# Patient Record
Sex: Male | Born: 1955
Health system: Southern US, Community
[De-identification: ages and names within clinical notes are randomized; demographics above are authoritative.]

## PROBLEM LIST (undated history)

## (undated) DIAGNOSIS — B1910 Unspecified viral hepatitis B without hepatic coma: Secondary | ICD-10-CM

## (undated) DIAGNOSIS — D3610 Benign neoplasm of peripheral nerves and autonomic nervous system, unspecified: Secondary | ICD-10-CM

## (undated) DIAGNOSIS — T7840XA Allergy, unspecified, initial encounter: Secondary | ICD-10-CM

## (undated) DIAGNOSIS — E785 Hyperlipidemia, unspecified: Secondary | ICD-10-CM

## (undated) DIAGNOSIS — E039 Hypothyroidism, unspecified: Secondary | ICD-10-CM

## (undated) DIAGNOSIS — K219 Gastro-esophageal reflux disease without esophagitis: Secondary | ICD-10-CM

## (undated) DIAGNOSIS — J984 Other disorders of lung: Secondary | ICD-10-CM

## (undated) DIAGNOSIS — S51819A Laceration without foreign body of unspecified forearm, initial encounter: Secondary | ICD-10-CM

## (undated) HISTORY — DX: Allergy, unspecified, initial encounter: T78.40XA

## (undated) HISTORY — PX: OTHER SURGICAL HISTORY: SHX169

## (undated) HISTORY — DX: Other disorders of lung: J98.4

## (undated) HISTORY — PX: LUNG SURGERY: SHX703

## (undated) HISTORY — PX: LIPOMA EXCISION: SHX5283

## (undated) HISTORY — DX: Gastro-esophageal reflux disease without esophagitis: K21.9

## (undated) HISTORY — DX: Hyperlipidemia, unspecified: E78.5

## (undated) HISTORY — DX: Unspecified viral hepatitis B without hepatic coma: B19.10

## (undated) HISTORY — PX: UPPER GASTROINTESTINAL ENDOSCOPY: SHX188

## (undated) HISTORY — PX: COLONOSCOPY: SHX174

## (undated) MED FILL — Dexamethasone Sodium Phosphate Inj 100 MG/10ML: INTRAMUSCULAR | Qty: 1 | Status: AC

---

## 2001-07-19 ENCOUNTER — Encounter: Admission: RE | Admit: 2001-07-19 | Discharge: 2001-07-19 | Payer: Self-pay | Admitting: Rheumatology

## 2001-07-19 ENCOUNTER — Encounter: Payer: Self-pay | Admitting: Rheumatology

## 2001-07-21 ENCOUNTER — Encounter: Admission: RE | Admit: 2001-07-21 | Discharge: 2001-07-21 | Payer: Self-pay | Admitting: Rheumatology

## 2001-07-21 ENCOUNTER — Encounter: Payer: Self-pay | Admitting: Rheumatology

## 2001-09-29 ENCOUNTER — Encounter: Payer: Self-pay | Admitting: Family Medicine

## 2001-09-29 ENCOUNTER — Encounter: Admission: RE | Admit: 2001-09-29 | Discharge: 2001-09-29 | Payer: Self-pay | Admitting: Family Medicine

## 2007-02-09 ENCOUNTER — Emergency Department (HOSPITAL_COMMUNITY): Admission: EM | Admit: 2007-02-09 | Discharge: 2007-02-09 | Payer: Self-pay | Admitting: Emergency Medicine

## 2007-12-05 ENCOUNTER — Ambulatory Visit: Payer: Self-pay | Admitting: Internal Medicine

## 2007-12-16 ENCOUNTER — Ambulatory Visit: Payer: Self-pay | Admitting: Internal Medicine

## 2007-12-16 ENCOUNTER — Encounter: Payer: Self-pay | Admitting: Internal Medicine

## 2008-07-24 ENCOUNTER — Telehealth: Payer: Self-pay | Admitting: Internal Medicine

## 2008-07-25 ENCOUNTER — Ambulatory Visit: Payer: Self-pay | Admitting: Internal Medicine

## 2008-07-25 DIAGNOSIS — Z860100 Personal history of colon polyps, unspecified: Secondary | ICD-10-CM | POA: Insufficient documentation

## 2008-07-25 DIAGNOSIS — K602 Anal fissure, unspecified: Secondary | ICD-10-CM | POA: Insufficient documentation

## 2008-09-10 ENCOUNTER — Encounter: Payer: Self-pay | Admitting: Internal Medicine

## 2008-10-18 ENCOUNTER — Telehealth: Payer: Self-pay | Admitting: Internal Medicine

## 2008-10-18 ENCOUNTER — Ambulatory Visit: Payer: Self-pay | Admitting: Internal Medicine

## 2008-10-18 DIAGNOSIS — K589 Irritable bowel syndrome without diarrhea: Secondary | ICD-10-CM | POA: Insufficient documentation

## 2008-10-19 ENCOUNTER — Telehealth: Payer: Self-pay | Admitting: Internal Medicine

## 2008-10-24 ENCOUNTER — Encounter: Payer: Self-pay | Admitting: Internal Medicine

## 2008-10-29 ENCOUNTER — Telehealth: Payer: Self-pay | Admitting: Internal Medicine

## 2008-11-02 ENCOUNTER — Telehealth: Payer: Self-pay | Admitting: Internal Medicine

## 2008-11-02 DIAGNOSIS — R142 Eructation: Secondary | ICD-10-CM

## 2008-11-02 DIAGNOSIS — R141 Gas pain: Secondary | ICD-10-CM | POA: Insufficient documentation

## 2008-11-02 DIAGNOSIS — R143 Flatulence: Secondary | ICD-10-CM

## 2008-11-02 DIAGNOSIS — R110 Nausea: Secondary | ICD-10-CM | POA: Insufficient documentation

## 2008-11-02 DIAGNOSIS — R1011 Right upper quadrant pain: Secondary | ICD-10-CM | POA: Insufficient documentation

## 2008-11-05 ENCOUNTER — Ambulatory Visit: Payer: Self-pay | Admitting: Internal Medicine

## 2008-11-05 LAB — CONVERTED CEMR LAB
ALT: 48 units/L (ref 0–53)
AST: 28 units/L (ref 0–37)
Albumin: 3.8 g/dL (ref 3.5–5.2)
Alkaline Phosphatase: 62 units/L (ref 39–117)
BUN: 11 mg/dL (ref 6–23)
Bilirubin, Direct: 0.1 mg/dL (ref 0.0–0.3)
CO2: 28 meq/L (ref 19–32)
Calcium: 9.4 mg/dL (ref 8.4–10.5)
Chloride: 104 meq/L (ref 96–112)
Creatinine, Ser: 1 mg/dL (ref 0.4–1.5)
GFR calc Af Amer: 101 mL/min
GFR calc non Af Amer: 84 mL/min
Glucose, Bld: 102 mg/dL — ABNORMAL HIGH (ref 70–99)
Potassium: 3.6 meq/L (ref 3.5–5.1)
Sodium: 141 meq/L (ref 135–145)
Total Bilirubin: 0.7 mg/dL (ref 0.3–1.2)
Total Protein: 7.2 g/dL (ref 6.0–8.3)

## 2008-11-07 ENCOUNTER — Ambulatory Visit: Payer: Self-pay | Admitting: Cardiology

## 2008-11-08 ENCOUNTER — Telehealth: Payer: Self-pay | Admitting: Internal Medicine

## 2008-11-08 LAB — CONVERTED CEMR LAB
HCV Ab: NEGATIVE
Hep B C IgM: POSITIVE — AB
Hep B S Ab: POSITIVE — AB
Hepatitis B Surface Ag: NEGATIVE

## 2008-11-15 ENCOUNTER — Ambulatory Visit: Payer: Self-pay | Admitting: Internal Medicine

## 2010-10-21 NOTE — Progress Notes (Signed)
Summary: TRIAGE-RECTAL PAIN   Phone Note Call from Patient Call back at [phone]   Caller: Patient Call For: brodie Reason for Call: Talk to Nurse Summary of Call: Pt states that he has rectal soreness since he had his col in March, states that the cream Dr. Juanda Chance wanted him to use does not help  please advise. Initial call taken by: Tawni Levy,  July 24, 2008 4:58 PM  Follow-up for Phone Call        Pt. c/o continued sore at recum, has never really healed. Pain and drainage is worse after BM's. Drainage is bloody.  Pt. will see Dr.Brodie this morning. Follow-up by: Laureen Ochs LPN,  July 25, 2008 8:34 AM

## 2010-10-21 NOTE — Letter (Signed)
Summary: [facility]   Imported By: Esmeralda Links D'jimraou 10/05/2008 16:27:49  _____________________________________________________________________  External Attachment:    Type:   Image     Comment:   External Document

## 2010-10-21 NOTE — Progress Notes (Signed)
Summary: TRIAGE-RUQ PAIN   Phone Note Call from Patient Call back at [phone]   Caller: Patient Call For: Juanda Chance Reason for Call: Talk to Nurse Summary of Call: Patient has severe stomach pain, a lot of soreness , can't hardly eat states that he cant wait until first available appointment (11-13-08) wants to know if he can be seen before that date. Initial call taken by: Tawni Levy,  October 18, 2008 8:37 AM  Follow-up for Phone Call        Pt. c/o  RUQ pain for approx. 1 month, also bloating,nausea,constipation, fever from 10am-3pm daily. Denies diarrhea,black stools,blood. Pt. will see Dr.Brodie today at 3pm.   Follow-up by: Laureen Ochs LPN,  October 18, 2008 9:17 AM

## 2010-10-21 NOTE — Progress Notes (Signed)
Summary: TRIAGE   Phone Note Outgoing Call   Call placed by: Laureen Ochs LPN,  November 02, 2008 4:21 PM Call placed to: Patient Summary of Call: Returning pt. call, he now wants to schedule the CT Scan and labs orderd by Dr.Brodie on 10-29-08. Pt. scheduled for labs (LFT's,Bun/Creat.) on 11-05-08. For CT abd/pelvis at [facility] CT on 11-07-08 at 8:30am.  Pt. states that he had Hep.B 10 years ago, but he became immune to it. He states he feels like he is getting it again and would like additional labs. DR.BRODIE PLEASE ADVISE Initial call taken by: Laureen Ochs LPN,  November 02, 2008 4:25 PM  Follow-up for Phone Call        C-met, Hep B serology ( HBs Ag, anti HB s, antiHBc,,antiHC antibody, please obtain, I agree with CT scan of the abd. and pelvis Follow-up by: Hart Carwin MD,  November 03, 2008 10:25 AM  Additional Follow-up for Phone Call Additional follow up Details #1::        Lab orders are in IDX. Message left for patient to have labs drawn when he comes to pick-up his CT contrast and instructions. Pt. instructed to call back as needed.  Additional Follow-up by: Laureen Ochs LPN,  November 05, 2008 10:09 AM  New Problems: NAUSEA (ICD-787.02) ABDOMINAL BLOATING (ICD-787.3) ABDOMINAL PAIN, RIGHT UPPER QUADRANT (ICD-789.01)   New Problems: NAUSEA (ICD-787.02) ABDOMINAL BLOATING (ICD-787.3) ABDOMINAL PAIN, RIGHT UPPER QUADRANT (ICD-789.01)

## 2010-10-21 NOTE — Assessment & Plan Note (Signed)
Summary: F/U FROM LABS AND CT           [facility]    History of Present Illness Visit Type: follow up Primary GI MD: Lina Sar MD Primary Provider: Suzzette Righter, MD Requesting Provider: n/a Chief Complaint: follow-up visit labs and CT History of Present Illness:   This is a 55 year old white male with right upper quadrant abdominal discomfort. He became quite anxious about the the pain thinking that he has recurrent hepatitis B. He did not tell me during his previous visits about the history of hepatitis B in 2001 and thought that his symptoms were identical to the episode. However, his liver function tests on 11/05/08 were normal and a CT scan of the abdomen showed a normal liver outline, no focal lesions, normal splenic size and no signs of portal hypertension. His hepatitis B serologies confirmed the presence of surface antibodies as well as core antibodies. His surface antigen was negative. Patient brought with him today extensive medical records dating back to [birth year]. They include numerous liver function tests which were all normal in 1994. In 2001, he had 1 or 2 sets of liver tests with mild elevation of AST, several points over normal. His platelet count has remained normal and his serum albumin has been normal as well.   GI Review of Systems      Denies abdominal pain, acid reflux, belching, bloating, chest pain, dysphagia with liquids, dysphagia with solids, heartburn, loss of appetite, nausea, vomiting, vomiting blood, weight loss, and  weight gain.        Denies anal fissure, black tarry stools, change in bowel habit, constipation, diarrhea, diverticulosis, fecal incontinence, heme positive stool, hemorrhoids, irritable bowel syndrome, jaundice, light color stool, liver problems, rectal bleeding, and  rectal pain.   Updated Prior Medication List: MULTIVITAMINS  TABS (MULTIPLE VITAMIN) 1 tablet by mouth once daily  Current Allergies (reviewed today): No known allergies  Past  Medical History:    Reviewed history from 07/25/2008 and no changes required:       Current Problems:        COLONIC POLYPS, BENIGN, HX OF (ICD-V12.72)       Benign Tumor of the Lungs         Past Surgical History:    Reviewed history from 07/25/2008 and no changes required:       removal of left lower Lobe of lung   Family History:    Reviewed history from 07/25/2008 and no changes required:       No FH of Colon Cancer:       Family History of Prostate Cancer:granfather  Social History:    Reviewed history from 07/25/2008 and no changes required:       Patient has never smoked.        Alcohol Use - no       Daily Caffeine Use coffee and sodas daily       Illicit Drug Use - no       Patient gets regular exercise.  Review of Systems       Pertinent positive and negative review of systems were noted in the above HPI. All other ROS was otherwise negative.   Vital Signs:  Patient Profile:   55 Years Old Male Height:     70 inches Weight:      191 pounds BMI:     27.50 BSA:     2.05 Pulse rate:   88 / minute Pulse rhythm:   regular BP sitting:  172 / 94  (left arm)  Vitals Entered By: Milford Cage CMA (November 15, 2008 10:53 AM)                  Physical Exam  General:     Very anxious; he couldn't sit down. He brought his records and wanted to explain. Neck:     Supple; no masses or thyromegaly. Abdomen:     abdomen is relaxed with some voluntary guarding. Minimal tenderness in right upper quadrant. Liver not enlarged. It is at 9 cm by percussion. Liver does not appear to be tender. There is no ascites and no collateral venous circulation. Skin:     no spider nevi or signs of chronic liver disease.   Impression & Recommendations:  Problem # 1:  ABDOMINAL PAIN, RIGHT UPPER QUADRANT (ICD-789.01) Right upper quadrant abdominal pain has resolved. Patient comes today mostly because of his concern about the possibility of liver damage. There are minor  abnormalities of transaminase which were related to patient taking ibuprofen as well as multiple antibiotics for prostatitis. His liver function tests and his liver synthetic function as well as a CT scan of the abdomen have been unremarkable. The fact that he has a surface antibody is consistent  with  immunity to hepatitis B. I suggest  to avoid over-the-counter medications and always discuss any medications with his physician especially if he needs to use lipid-lowering agents in the future. He will obtain liver function tests from Dr. Manus Gunning in April and every 3 months for a year. If they remain normal, I would go to every 6 months before going to once a year. Patient has a lot of anxiety buildup concerning his liver, so he will always have to be rechecked.  Problem # 2:  IRRITABLE BOWEL SYNDROME (ICD-564.1) under good control. Patient is status post colonoscopy in April 2009 with leiomyoma in the rectosigmoid. He has a istory of anal fissure and condyloma treated with Podophyllin 0.5%.   Patient Instructions: 1)  repeat liver function test in 3 months 2)  Office visit in one year 3)  Limit use of over-the-counter medications and discuss any new medications with patient's physician in terms of possible liver toxicity. 4)  There is no indication for liver biopsy at this time. 5)  Copy Sent To:Dr R.Ehinger

## 2010-10-21 NOTE — Procedures (Signed)
Summary: Colonoscopy   Colonoscopy  Procedure date:  12/16/2007  Findings:      Location:  [facility].      Patient Name: Brian Kaiser, Brian Kaiser MRN:  Procedure Procedures: Colonoscopy CPT: 29562.    with biopsy. CPT: [id number].  Personnel: Endoscopist: Dora L. Juanda Chance, MD.  Exam Location: Exam performed in Outpatient Clinic. Outpatient  Patient Consent: Procedure, Alternatives, Risks and Benefits discussed, consent obtained, from patient. Consent was obtained by the RN.  Indications Symptoms: Hematochezia.  Average Risk Screening Routine.  History  Current Medications: Patient is not currently taking Coumadin.  Pre-Exam Physical: Performed Dec 16, 2007. Entire physical exam was normal.  Comments: Pt. history reviewed/updated, physical exam performed prior to initiation of sedation?yes Exam Exam: Extent of exam reached: Cecum, extent intended: Cecum.  The cecum was identified by appendiceal orifice and IC valve. Duration of exam: time in 5:51 min, out 8:12 min minutes. Colon retroflexion performed. Images taken. ASA Classification: I. Tolerance: good.  Monitoring: Pulse and BP monitoring, Oximetry used. Supplemental O2 given.  Colon Prep Used Moviprep for colon prep. Prep results: good.  Sedation Meds: Patient assessed and found to be appropriate for moderate (conscious) sedation. Fentanyl 75 mcg. given IV. Versed 10 mg. given IV.  Findings - NORMAL EXAM: Cecum.  POLYP: Rectum, Maximum size: 7 mm. sessile polyp. Distance from Anus 10 cm. Procedure:  biopsy without cautery, The polyp was removed piece meal. removed, retrieved, Polyp sent to pathology. ICD9: Colon Polyps: 211.3.  - NORMAL EXAM: Rectum.   Assessment Abnormal examination, see findings above.  Diagnoses: 211.3: Colon Polyps.   Comments: rectal polyp removed  from10 cm Events  Unplanned Interventions: No intervention was required.  Unplanned Events: There were no  complications. Plans Medication Plan: Await pathology.  Patient Education: Patient given standard instructions for: Patient instructed to get routine colonoscopy every 5-7 years.  Disposition: After procedure patient sent to recovery. After recovery patient sent home.    CC: Blair Heys MD  This report was created from the original endoscopy report, which was reviewed and signed by the above listed endoscopist.

## 2010-10-21 NOTE — Progress Notes (Signed)
Summary: sch test   Phone Note Call from Patient Call back at Home Phone [phone]   Caller: Patient Call For: BRODIE  Reason for Call: Talk to Nurse Details for Reason: f-up Summary of Call: pt still having probs was told to c/b to sch further testing  Initial call taken by: Guadlupe Spanish [facility],  November 02, 2008 3:24 PM      Appended Document: sch test See triage note form 11-02-08 at 4:21pm.

## 2010-10-21 NOTE — Assessment & Plan Note (Signed)
Summary: RECTAL SORE WITH PAIN AND DRAINAGE     Brian Kaiser    History of Present Illness Visit Type: follow up Primary GI MD: Lina Sar MD Primary Provider: Marguarite Arbour Requesting Provider: n/a Chief Complaint: rectal sore with pain and drainage History of Present Illness:   This is a 55 year old white male with rectal pain, irritation and drainage. He is an acute work in today. We saw him in April 2009 for a colorectal screening;;  his colonoscopy showed leiomyoma in the rectosigmoid. He has no family history of colon cancer. He is  sometimes constipated. He has been using topical steroids and Preparation H. suppositories off and on with partial relief. His best friend was just diagnosed with rectal cancer after being misdiagnosed for several months. The patient is naturally concerned about the possibility of rectal cancer. Patient is a heterosexual.   GI Review of Systems      Denies abdominal pain, acid reflux, belching, bloating, chest pain, dysphagia with liquids, dysphagia with solids, heartburn, loss of appetite, nausea, vomiting, vomiting blood, weight loss, and  weight gain.      Reports anal fissure,rectal bleeding, and  rectal pain.        Prior Medications Reviewed Using: Patient Recall  Updated Prior Medication List: MULTIVITAMINS   TABS (MULTIPLE VITAMIN) 1 once daily ASPIRIN 81 MG TBEC (ASPIRIN) 1 tablet by mouth once daily LEVAQUIN 500 MG TABS (LEVOFLOXACIN) 1 tablet once daily  Current Allergies: No known allergies   Past Medical History:    Reviewed history and no changes required:       Current Problems:        COLONIC POLYPS, BENIGN, HX OF (ICD-V12.72)       Benign Tumor of the Lungs         Past Surgical History:    removal of left lower Lobe of lung   Family History:    Reviewed history and no changes required:       No FH of Colon Cancer:       Family History of Prostate Cancer:granfather  Social History:    Reviewed history and no changes  required:       Patient has never smoked.        Alcohol Use - no       Daily Caffeine Use coffee and sodas daily       Illicit Drug Use - no       Patient gets regular exercise.   Risk Factors:  Tobacco use:  never Drug use:  no Alcohol use:  no Exercise:  yes   Review of Systems       The patient complains of skin rash and sleeping problems.         posion oak   Vital Signs:  Patient Profile:   55 Years Old Male Height:     70 inches Weight:      194.13 pounds BMI:     27.96 Pulse rate:   84 / minute Pulse rhythm:   regular BP sitting:   140 / 92  (left arm)  Vitals Entered By: Merri Ray CMA (July 25, 2008 9:51 AM)                  Physical Exam  General:     Well developed, well nourished, no acute distress. Neck:     Supple; no masses or thyromegaly. Lungs:     Clear throughout to auscultation. Heart:     Regular  rate and rhythm; no murmurs, rubs,  or bruits. Abdomen:     soft abdomen without tenderness, organomegaly or any palpable mass. Bowel sounds are normal. Rectal:     perianal area shows  polypoid-like growth at 3 o'clock at the rectal orifice  which resembles condyloma, but I believe it is scar tissue from a healed anal fissure. It did show  weeping and irritation. The rectal tone is  somewhat increased. The rectal ampulla shows small first-grade hemorrhoids which don't appear to be hyperemic.Marland Kitchen Stool is hemoccult negative. There is no evidence of proctitis. Extremities:     No clubbing, cyanosis, edema or deformities noted. Neurologic:     Alert and  oriented x4;  grossly normal neurologically.    Impression & Recommendations:  Problem # 1:  COLONIC POLYPS, BENIGN, HX OF (ICD-V12.72) Leiomyoma of the rectosigmoid removed in April 2009. A recall colonoscopy is due in 10 years which would be in April 2019.  Problem # 2:  RECTAL FISSURE (ICD-565.0) healed rectal fissure with excessive scar tissue forming a pseudopolyp at the  rectal orifice, protruding and causing irritation. Another possibility is condyloma but it is less likely. Patient is a heterosexual. Stool is hemoccult negative. There is no evidence of rectal cancer. Patient would like to have this tag removed surgically. We will make a referral to a general surgeon for excision of the perirectal skin tag. We have discussed treatment of constipation with a high-fiber diet and fiber supplements. He will use OTC suppositories for his hemorrhoids until he can see the surgeon.  I have given him samples of Calmoseptine to be used several times a day to protect the skin. Orders: [facility] ([facility])    Patient Instructions: 1)  Calmoseptine slt three times a day and prn 2)   Preparation H suppositories q.h.s. 3)  High-fiber diet 4)  Metamucil daily 5)  Surgical referral to [facility] surgery for excision of peri- rectal skin tag 6)  Copy Sent ZD:[facility], Dr Dion Body    ]

## 2010-10-21 NOTE — Assessment & Plan Note (Signed)
Summary: SEVERE RUQ PAIN      DEBORAH   History of Present Illness Visit Type: follow up Primary GI MD: Lina Sar MD Primary Provider: Suzzette Righter, MD Requesting Provider: n/a Chief Complaint: abdominal pain, right side, worse after eating.  Pt has been on 3 rounds of antibiotics since November 2009 History of Present Illness:   This is a 55 year old white male with bloating and abdominal distention of several weeks duration. He has a history of taking several courses of broad-spectrum antibiotics starting November 2009 for prostatitis. He took Levaquin and 4 weeks of Bactrim. Patient denies any rectal bleeding. He has been slightly constipated. Patient had a screening colonoscopy in April 2009 with findings of a leiomyoma of the colon.   GI Review of Systems    Reports abdominal pain, bloating, and  nausea.     Location of  Abdominal pain: right side.    Denies acid reflux, belching, chest pain, dysphagia with liquids, dysphagia with solids, heartburn, loss of appetite, vomiting, vomiting blood, weight loss, and  weight gain.        Denies anal fissure, black tarry stools, change in bowel habit, constipation, diarrhea, diverticulosis, fecal incontinence, heme positive stool, hemorrhoids, irritable bowel syndrome, jaundice, light color stool, liver problems, rectal bleeding, and  rectal pain.     Prior Medications Reviewed Using: Patient Recall  Updated Prior Medication List: No Medications Current Allergies (reviewed today): No known allergies   Past Medical History:    Reviewed history from 07/25/2008 and no changes required:       Current Problems:        COLONIC POLYPS, BENIGN, HX OF (ICD-V12.72)       Benign Tumor of the Lungs         Past Surgical History:    Reviewed history from 07/25/2008 and no changes required:       removal of left lower Lobe of lung   Family History:    Reviewed history from 07/25/2008 and no changes required:       No FH of Colon  Cancer:       Family History of Prostate Cancer:granfather  Social History:    Reviewed history from 07/25/2008 and no changes required:       Patient has never smoked.        Alcohol Use - no       Daily Caffeine Use coffee and sodas daily       Illicit Drug Use - no       Patient gets regular exercise.    Review of Systems       .ros   Vital Signs:  Patient Profile:   55 Years Old Male Height:     70 inches Weight:      190 pounds BMI:     27.36 Pulse rate:   80 / minute Pulse rhythm:   regular BP sitting:   130 / 88  (left arm) Cuff size:   regular  Vitals Entered By: Francee Piccolo CMA (October 18, 2008 3:29 PM)                  Physical Exam  General:     patient appears anxious. Neck:     Supple; no masses or thyromegaly. Lungs:     Clear throughout to auscultation. Heart:     Regular rate and rhythm; no murmurs, rubs or bruits. Abdomen:     abdomen is soft, nondistended but diffusely tender with hyperactive bowel  sounds. Liver edge is at costal margin. There is no palpable mass, no rebound. Rectal:     rectal exam with increased amount of soft formed hemoccult-negative stool.    Impression & Recommendations:  Problem # 1:  IRRITABLE BOWEL SYNDROME (ICD-564.1) irritable bowel syndrome following a prolonged course of antibiotics. I suspect we are dealing with bacterial overgrowth as well as with functional constipation as . I have asked the patient to take extra fiber and have put him on Flagyl 250 mg 3 times a day. He also received samples of Prilosec for presumed gastritis. Because of his hyperactive bowel sounds and spasm he will take Bentyl 10 mg twice a day. If the symptoms continue, we will consider obtaining a KUB.  Problem # 2:  COLONIC POLYPS, BENIGN, HX OF (ICD-V12.72) status post colonoscopy in April 2009. Patient had a leiomyoma.   Patient Instructions: 1)  Bentyl 10 mg p.o. b.i.d. 2)  Flagyl 250 mg p.o. t.i.d. x1 week 3)   Metamucil daily 4)  Samples of Prilosec 20 mg daily 5)  If no improvement, obtain KUB 6)  Copy Sent To:Dr R.Ehinger

## 2010-10-21 NOTE — Progress Notes (Signed)
Summary: results   Phone Note Call from Patient Call back at Home Phone [phone]   Caller: Patient Call For: Brian Kaiser  Reason for Call: Talk to Nurse, Lab or Test Results Details for Reason: results Summary of Call: would like CT and Labs results  Initial call taken by: Guadlupe Spanish [facility],  November 08, 2008 8:35 AM  Follow-up for Phone Call        Lab and CT results called to pt. Pt. to keep scheduled office visit on 11-15-08 at 10:45am. Pt. instructed to call back as needed.  Follow-up by: Laureen Ochs LPN,  November 08, 2008 8:50 AM

## 2010-10-21 NOTE — Progress Notes (Signed)
Summary: Calling to give [place] pharmacy info  Medications Added BENTYL 10 MG CAPS (DICYCLOMINE HCL) Take 1 tablet by mouth two times a day FLAGYL 250 MG TABS (METRONIDAZOLE) Take 1 tablet by mouth three times a day x 1 week       Phone Note Call from Patient Call back at Work Phone [phone]   Call For: Dr Juanda Chance Reason for Call: Talk to Nurse Summary of Call: Was here yesterday and was to get some medicine called in to Pharmacy. Was not asked which pharmacy he uses so he thought he would call to tell us to send to [facility]. Initial call taken by: Leanor Kail [facility],  October 19, 2008 2:02 PM  Follow-up for Phone Call        Rx has been sent. Follow-up by: Hortense Ramal CMA,  October 19, 2008 3:12 PM    New/Updated Medications: BENTYL 10 MG CAPS (DICYCLOMINE HCL) Take 1 tablet by mouth two times a day FLAGYL 250 MG TABS (METRONIDAZOLE) Take 1 tablet by mouth three times a day x 1 week   Prescriptions: FLAGYL 250 MG TABS (METRONIDAZOLE) Take 1 tablet by mouth three times a day x 1 week  #21 x 0   Entered by:   Hortense Ramal CMA   Authorized by:   Hart Carwin MD   Signed by:   Hortense Ramal CMA on 10/19/2008   Method used:   Electronically to        [facility] [address] [id number]* (retail)       [address] [place], [place]  [address]       Ph: [phone]       Fax: [phone]   RxID:   [id number] BENTYL 10 MG CAPS (DICYCLOMINE HCL) Take 1 tablet by mouth two times a day  #20 x 0   Entered by:   Hortense Ramal CMA   Authorized by:   Hart Carwin MD   Signed by:   Hortense Ramal CMA on 10/19/2008   Method used:   Electronically to        [facility] [address] [id number]* (retail)       [address] [place], [place]  [address]       Ph: [phone]       Fax: [phone]   RxID:   [id number]

## 2010-10-21 NOTE — Progress Notes (Signed)
Summary: TRIAGE   Phone Note Call from Patient Call back at Home Phone [phone]   Caller: Patient Call For: Juanda Chance Reason for Call: Talk to Nurse Summary of Call: Patient states that he is still having right upper quad pain, meds given to him on 1-28 are not helping was told by Dr Juanda Chance that if this persisted to call her and schedule an appointment for as soon as possible. Initial call taken by: Tawni Levy,  October 29, 2008 3:46 PM  Follow-up for Phone Call        Pt. continues with RUQ pain, same as at OV on 10-18-08. Pt. also c/o a metallic taste in his mouth and a general itching, for the last 3 days. He has stopped the Prilosec and the Bentyl. Poor appetite, feels full all the time. Denies n/v,fever,constipation,diarrhea,blood or black stools. I will call pt., with new orders, after MD reviews.  Follow-up by: Laureen Ochs LPN,  October 29, 2008 4:10 PM  Additional Follow-up for Phone Call Additional follow up Details #1::        Please set up CT scan of the abdomen and pelvis with IV contrast, and obtain LFT's and BUN,creat Additional Follow-up by: Hart Carwin MD,  October 29, 2008 6:17 PM    Additional Follow-up for Phone Call Additional follow up Details #2::    Above MD orders reviewed w/patient. Pt. declines to schedule CT/labs, he states, "Maybe I overreacted. I had Hepatitis about 10 years ago and this is the closest I've been since then. I feel a little better today, so if it's alright I will callback next week, if I need to." Pt. also states he is leaving tomorrow to go out of town until next week.Pt. aware I will advise Dr.Asaph Serena. Pt. instructed to call back as needed.   Follow-up by: Laureen Ochs LPN,  October 30, 2008 8:23 AM  Additional Follow-up for Phone Call Additional follow up Details #3:: Details for Additional Follow-up Action Taken: OK Additional Follow-up by: Hart Carwin MD,  October 30, 2008 1:17 PM

## 2014-06-12 ENCOUNTER — Encounter: Payer: Self-pay | Admitting: Internal Medicine

## 2014-06-14 ENCOUNTER — Encounter: Payer: Self-pay | Admitting: *Deleted

## 2014-08-03 ENCOUNTER — Ambulatory Visit (INDEPENDENT_AMBULATORY_CARE_PROVIDER_SITE_OTHER): Payer: Managed Care, Other (non HMO) | Admitting: Internal Medicine

## 2014-08-03 ENCOUNTER — Encounter: Payer: Self-pay | Admitting: Internal Medicine

## 2014-08-03 VITALS — BP 136/80 | HR 82 | Ht 70.0 in | Wt 189.0 lb

## 2014-08-03 DIAGNOSIS — R1310 Dysphagia, unspecified: Secondary | ICD-10-CM

## 2014-08-03 MED ORDER — OMEPRAZOLE 40 MG PO CPDR: 40.0000 mg | DELAYED_RELEASE_CAPSULE | ORAL | Status: DC

## 2014-08-03 NOTE — Progress Notes (Signed)
Brian Kaiser 11/08/1955 [id number]  Note: This dictation was prepared with Dragon digital system. Any transcriptional errors that result from this procedure are unintentional.   History of Present Illness:  This is a 58 year old white male who is here to discuss progressive  dysphagia. He complains of having to clear his throat continuously during the day as well as at night. He also has a nocturnal cough. He has occasional dysphagia to solids but not to liquids. He denies any chest pain. He has tried Prilosec 20 g daily which only slightly improved his symptoms but he continues to have to clear his throat. He denies heartburn. We have seen him in the past for colorectal screening. 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 He also has a history of anal fissure. He has a history of hepatitis B , his serologies are positive for surface antibody and anti-core antibody but negative for surface antigen.  CT scan of the abdomen in 2010 showed no evidence of focal liver lesions or portal hypertension. He does not smoke.  drinks up to 4 cups of coffee a day. His liver function tests have been normal.    Past Medical History  Diagnosis Date  . Hepatitis B   . Hyperlipidemia   . Pulmonary lesion     Left pulmonary schwannoma    Past Surgical History  Procedure Laterality Date  . Other surgical history Left     pulmonary schwannoma excision  . Lipoma excision Left     neck    No Known Allergies  Family history and social history have been reviewed.  Review of Systems: positive for cough and occasional hoarseness. Negative for heartburn. Positive for dysphagia to solids  The remainder of the 10 point ROS is negative except as outlined in the H&P  Physical Exam: General Appearance Well developed, in no distress Eyes  Non icteric  HEENT  Non traumatic, normocephalic  Mouth No lesion, tongue papillated, no cheilosis,normal voice Neck Supple  without adenopathy, thyroid not enlarged, no carotid bruits, no JVD Lungs Clear to auscultation bilaterally COR Normal S1, normal S2, regular rhythm, no murmur, quiet precordium Abdomen soft nontender with normoactive bowel sounds. Liver edge at costal margin. No ascites. Lower abdomen unremarkable Rectal not done Extremities  No pedal edema Skin No lesions Neurological Alert and oriented x 3 Psychological Normal mood and affect  Assessment and Plan:   Problem #80 58 year old white male who is concerned about possibility of throat or esophagus cancer  having to clear his throat repeatedly during the day. He does not feel it is caused by allergies because it's not seasonal. He has had only slight improvement with Prilosec. His symptoms are worse after meals. It is not clear whether this represents gastroesophageal reflux or whether we are dealing with an esophageal dismotility or ENT  problem. We will increase  Prilosec to 40 mg daily and will schedule  upper endoscopy to rule out any structural lesion of the esophagus such as hiatal hernia, stricture or esophagitis. I have instructed pt in  antireflux measures which would include cutting down on  caffeine intake and elevating head of the bed at night.  Problem #2 Colorectal screening. His last colonoscopy was in 2009. He is up-to-date on colonoscopy.    Delfin Edis 08/03/2014

## 2014-08-03 NOTE — Patient Instructions (Addendum)
You have been scheduled for an endoscopy. Please follow written instructions given to you at your visit today. If you use inhalers (even only as needed), please bring them with you on the day of your procedure. Your physician has requested that you go to www.startemmi.com and enter the access code given to you at your visit today. This web site gives a general overview about your procedure. However, you should still follow specific instructions given to you by our office regarding your preparation for the procedure.  We have sent the following medications to your pharmacy for you to pick up at your convenience: Prilosec 40 mg every morning  CC:Dr Ehinger

## 2014-08-13 ENCOUNTER — Encounter: Payer: Self-pay | Admitting: Internal Medicine

## 2014-09-26 ENCOUNTER — Ambulatory Visit (AMBULATORY_SURGERY_CENTER): Payer: Managed Care, Other (non HMO) | Admitting: Internal Medicine

## 2014-09-26 ENCOUNTER — Encounter: Payer: Self-pay | Admitting: Internal Medicine

## 2014-09-26 VITALS — BP 141/91 | HR 57 | Temp 98.3°F | Resp 18 | Ht 70.0 in | Wt 189.0 lb

## 2014-09-26 DIAGNOSIS — R1310 Dysphagia, unspecified: Secondary | ICD-10-CM

## 2014-09-26 DIAGNOSIS — K208 Other esophagitis: Secondary | ICD-10-CM

## 2014-09-26 MED ORDER — SODIUM CHLORIDE 0.9 % IV SOLN: 500.0000 mL | INTRAVENOUS | Status: DC

## 2014-09-26 NOTE — Progress Notes (Signed)
A/ox3 pleased with MAC, report to Wendy RN 

## 2014-09-26 NOTE — Progress Notes (Signed)
Called to room to assist during endoscopic procedure.  Patient ID and intended procedure confirmed with present staff. Received instructions for my participation in the procedure from the performing physician.  

## 2014-09-26 NOTE — Op Note (Signed)
[place]  [address] [place], [id number]   ENDOSCOPY PROCEDURE REPORT  PATIENT: Brian Kaiser, Brian Kaiser  MR#: [id number] BIRTHDATE: [birth year] , 76  yrs. old GENDER: male ENDOSCOPIST: Lafayette Dragon, MD REFERRED BY:  [name], M.D. PROCEDURE DATE:  09/26/2014 PROCEDURE:  EGD w/ biopsy and Maloney dilation of esophagus ASA CLASS:     Class I INDICATIONS:  dysphagia and globus sensation.  Cough it constant clearing of the throat.  Prilosec increased to 40 mg daily. MEDICATIONS: Monitored anesthesia care and Propofol 200 mg IV TOPICAL ANESTHETIC: none  DESCRIPTION OF PROCEDURE: After the risks benefits and alternatives of the procedure were thoroughly explained, informed consent was obtained.  The LB [id number] endoscope was introduced through the mouth and advanced to the second portion of the duodenum , Without limitations.  The instrument was slowly withdrawn as the mucosa was fully examined.    Esophagus: proximal, mid and distal esophageal mucosa was normal. There was no evidence of acute esophagitis. Z line was irregular. Biopsies were obtained to rule out Barrett's esophagus. There was no stricture. There was no hiatal hernia Stomach: endoscope traversed into the stomach without resistance. Gastric folds were normal. Gastric antrum and pyloric abdomen were unremarkable. Retroflexion of the endoscope revealed normal fundus and cardiaDuodenum: duodenal bulb and descending duodenum was normal[         The scope was then withdrawn from the patient and the procedure completed. 26 French Maloney dilator passed through the esophagus with mild resistance. There was blood on the dilator  COMPLICATIONS: There were no immediate complications.  ENDOSCOC IMPRESSION:  1.essentially normal upper endoscopy of esophagus stomach and duodenum 2. biopsies from the GE junction 3. Passage of a 15 French Maloney dilator without evidence  of stricture  RECOMMENDATIONS: 1.  Await pathology results 2.  Anti-reflux regimen to be follow 3.  Continue Prilosec 40 mg daily for next 4 weeks  REPEAT EXAM: for EGD pending biopsy results.  eSigned:  Lafayette Dragon, MD 09/26/2014 2:25 PM    CC:  PATIENT NAME:  Brian Kaiser, Brian Kaiser MR#: [id number]

## 2014-09-26 NOTE — Patient Instructions (Signed)

## 2014-09-27 ENCOUNTER — Telehealth: Payer: Self-pay | Admitting: *Deleted

## 2014-09-27 NOTE — Telephone Encounter (Signed)
  Follow up Call-  Call back number 09/26/2014  Post procedure Call Back phone  # [phone]  Permission to leave phone message Yes     Patient questions:  Do you have a fever, pain , or abdominal swelling? No. Pain Score  0 *  Have you tolerated food without any problems? Yes.    Have you been able to return to your normal activities? Yes.    Do you have any questions about your discharge instructions: Diet   No. Medications  No. Follow up visit  No.  Do you have questions or concerns about your Care? No.  Actions: * If pain score is 4 or above: No action needed, pain <4.

## 2014-10-03 ENCOUNTER — Telehealth: Payer: Self-pay | Admitting: Internal Medicine

## 2014-10-03 NOTE — Telephone Encounter (Signed)
Patient notified that MD has not reviewed results yet. Letter will be sent when she does.

## 2014-10-10 ENCOUNTER — Encounter: Payer: Self-pay | Admitting: Internal Medicine

## 2014-10-23 ENCOUNTER — Telehealth: Payer: Self-pay | Admitting: Internal Medicine

## 2014-10-23 NOTE — Telephone Encounter (Signed)
Patient given results as per letter on 10/10/14.

## 2015-12-03 ENCOUNTER — Other Ambulatory Visit: Payer: Self-pay | Admitting: Family Medicine

## 2015-12-23 ENCOUNTER — Other Ambulatory Visit: Payer: Self-pay | Admitting: Family Medicine

## 2015-12-30 ENCOUNTER — Encounter: Payer: Self-pay | Admitting: Internal Medicine

## 2017-06-27 DIAGNOSIS — L259 Unspecified contact dermatitis, unspecified cause: Secondary | ICD-10-CM | POA: Diagnosis not present

## 2017-06-28 DIAGNOSIS — L237 Allergic contact dermatitis due to plants, except food: Secondary | ICD-10-CM | POA: Diagnosis not present

## 2017-11-09 ENCOUNTER — Encounter: Payer: Self-pay | Admitting: Gastroenterology

## 2017-11-15 ENCOUNTER — Encounter: Payer: Self-pay | Admitting: *Deleted

## 2017-11-30 ENCOUNTER — Ambulatory Visit (AMBULATORY_SURGERY_CENTER): Payer: Self-pay

## 2017-11-30 ENCOUNTER — Other Ambulatory Visit: Payer: Self-pay

## 2017-11-30 VITALS — Ht 69.5 in | Wt 198.2 lb

## 2017-11-30 DIAGNOSIS — Z1211 Encounter for screening for malignant neoplasm of colon: Secondary | ICD-10-CM

## 2017-11-30 MED ORDER — NA SULFATE-K SULFATE-MG SULF 17.5-3.13-1.6 GM/177ML PO SOLN: 1.0000 | Freq: Once | ORAL | 0 refills | Status: AC

## 2017-11-30 NOTE — Progress Notes (Signed)
Denies allergies to eggs or soy products. Denies complication of anesthesia or sedation. Denies use of weight loss medication. Denies use of O2.   Emmi instructions declined.  

## 2017-12-13 ENCOUNTER — Encounter: Payer: Self-pay | Admitting: Gastroenterology

## 2017-12-14 ENCOUNTER — Encounter: Payer: Managed Care, Other (non HMO) | Admitting: Gastroenterology

## 2017-12-16 DIAGNOSIS — R51 Headache: Secondary | ICD-10-CM | POA: Diagnosis not present

## 2017-12-16 DIAGNOSIS — Z209 Contact with and (suspected) exposure to unspecified communicable disease: Secondary | ICD-10-CM | POA: Diagnosis not present

## 2017-12-16 DIAGNOSIS — R5383 Other fatigue: Secondary | ICD-10-CM | POA: Diagnosis not present

## 2017-12-16 DIAGNOSIS — R030 Elevated blood-pressure reading, without diagnosis of hypertension: Secondary | ICD-10-CM | POA: Diagnosis not present

## 2017-12-23 DIAGNOSIS — E781 Pure hyperglyceridemia: Secondary | ICD-10-CM | POA: Diagnosis not present

## 2017-12-23 DIAGNOSIS — Z0000 Encounter for general adult medical examination without abnormal findings: Secondary | ICD-10-CM | POA: Diagnosis not present

## 2017-12-23 DIAGNOSIS — I10 Essential (primary) hypertension: Secondary | ICD-10-CM | POA: Diagnosis not present

## 2017-12-23 DIAGNOSIS — Z1211 Encounter for screening for malignant neoplasm of colon: Secondary | ICD-10-CM | POA: Diagnosis not present

## 2017-12-23 DIAGNOSIS — Z125 Encounter for screening for malignant neoplasm of prostate: Secondary | ICD-10-CM | POA: Diagnosis not present

## 2017-12-23 DIAGNOSIS — L739 Follicular disorder, unspecified: Secondary | ICD-10-CM | POA: Diagnosis not present

## 2017-12-23 DIAGNOSIS — K219 Gastro-esophageal reflux disease without esophagitis: Secondary | ICD-10-CM | POA: Diagnosis not present

## 2017-12-23 DIAGNOSIS — F411 Generalized anxiety disorder: Secondary | ICD-10-CM | POA: Diagnosis not present

## 2017-12-27 ENCOUNTER — Other Ambulatory Visit: Payer: Self-pay

## 2017-12-27 ENCOUNTER — Encounter: Payer: Self-pay | Admitting: Gastroenterology

## 2017-12-27 ENCOUNTER — Ambulatory Visit (AMBULATORY_SURGERY_CENTER): Payer: 59 | Admitting: Gastroenterology

## 2017-12-27 VITALS — BP 124/77 | HR 58 | Temp 98.0°F | Resp 13 | Ht 69.0 in | Wt 198.0 lb

## 2017-12-27 DIAGNOSIS — K635 Polyp of colon: Secondary | ICD-10-CM | POA: Diagnosis not present

## 2017-12-27 DIAGNOSIS — Z1211 Encounter for screening for malignant neoplasm of colon: Secondary | ICD-10-CM

## 2017-12-27 DIAGNOSIS — D120 Benign neoplasm of cecum: Secondary | ICD-10-CM

## 2017-12-27 DIAGNOSIS — Z86010 Personal history of colonic polyps: Secondary | ICD-10-CM | POA: Diagnosis not present

## 2017-12-27 DIAGNOSIS — D125 Benign neoplasm of sigmoid colon: Secondary | ICD-10-CM | POA: Diagnosis not present

## 2017-12-27 MED ORDER — SODIUM CHLORIDE 0.9 % IV SOLN: 500.0000 mL | Freq: Once | INTRAVENOUS | Status: DC

## 2017-12-27 NOTE — Progress Notes (Signed)
Called to room to assist during endoscopic procedure.  Patient ID and intended procedure confirmed with present staff. Received instructions for my participation in the procedure from the performing physician.  

## 2017-12-27 NOTE — Patient Instructions (Signed)
Information on polyps given.   YOU HAD AN ENDOSCOPIC PROCEDURE TODAY AT THE [facility]:   Refer to the procedure report that was given to you for any specific questions about what was found during the examination.  If the procedure report does not answer your questions, please call your gastroenterologist to clarify.  If you requested that your care partner not be given the details of your procedure findings, then the procedure report has been included in a sealed envelope for you to review at your convenience later.  YOU SHOULD EXPECT: Some feelings of bloating in the abdomen. Passage of more gas than usual.  Walking can help get rid of the air that was put into your GI tract during the procedure and reduce the bloating. If you had a lower endoscopy (such as a colonoscopy or flexible sigmoidoscopy) you may notice spotting of blood in your stool or on the toilet paper. If you underwent a bowel prep for your procedure, you may not have a normal bowel movement for a few days.  Please Note:  You might notice some irritation and congestion in your nose or some drainage.  This is from the oxygen used during your procedure.  There is no need for concern and it should clear up in a day or so.  SYMPTOMS TO REPORT IMMEDIATELY:   Following lower endoscopy (colonoscopy or flexible sigmoidoscopy):  Excessive amounts of blood in the stool  Significant tenderness or worsening of abdominal pains  Swelling of the abdomen that is new, acute  Fever of 100F or higher     For urgent or emergent issues, a gastroenterologist can be reached at any hour by calling [phone].   DIET:  We do recommend a small meal at first, but then you may proceed to your regular diet.  Drink plenty of fluids but you should avoid alcoholic beverages for 24 hours.  ACTIVITY:  You should plan to take it easy for the rest of today and you should NOT DRIVE or use heavy machinery until tomorrow (because of the  sedation medicines used during the test).    FOLLOW UP: Our staff will call the number listed on your records the next business day following your procedure to check on you and address any questions or concerns that you may have regarding the information given to you following your procedure. If we do not reach you, we will leave a message.  However, if you are feeling well and you are not experiencing any problems, there is no need to return our call.  We will assume that you have returned to your regular daily activities without incident.  If any biopsies were taken you will be contacted by phone or by letter within the next 1-3 weeks.  Please call us at [phone] if you have not heard about the biopsies in 3 weeks.    SIGNATURES/CONFIDENTIALITY: You and/or your care partner have signed paperwork which will be entered into your electronic medical record.  These signatures attest to the fact that that the information above on your After Visit Summary has been reviewed and is understood.  Full responsibility of the confidentiality of this discharge information lies with you and/or your care-partner. 

## 2017-12-27 NOTE — Progress Notes (Signed)
To recovery, report to RN, VSS. 

## 2017-12-27 NOTE — Progress Notes (Signed)
Pt's states no medical or surgical changes since previsit or office visit. 

## 2017-12-27 NOTE — Op Note (Signed)
[facility] Patient Name: Brian Kaiser Procedure Date: 12/27/2017 1:27 PM MRN: [id number] Endoscopist: Mauri Pole , MD Age: 62 Referring MD:  Date of Birth: 03/14/1956 Gender: Male Account #: [id number] Procedure:                Colonoscopy Indications:              Screening for colorectal malignant neoplasm, Last                            colonoscopy: 2009 Medicines:                Monitored Anesthesia Care Procedure:                Pre-Anesthesia Assessment:                           - Prior to the procedure, a History and Physical                            was performed, and patient medications and                            allergies were reviewed. The patient's tolerance of                            previous anesthesia was also reviewed. The risks                            and benefits of the procedure and the sedation                            options and risks were discussed with the patient.                            All questions were answered, and informed consent                            was obtained. Prior Anticoagulants: The patient has                            taken no previous anticoagulant or antiplatelet                            agents. ASA Grade Assessment: II - A patient with                            mild systemic disease. After reviewing the risks                            and benefits, the patient was deemed in                            satisfactory condition to undergo the procedure.  After obtaining informed consent, the colonoscope                            was passed under direct vision. Throughout the                            procedure, the patient's blood pressure, pulse, and                            oxygen saturations were monitored continuously. The                            Colonoscope was introduced through the anus and                            advanced to the the cecum, identified by                             appendiceal orifice and ileocecal valve. The                            colonoscopy was performed without difficulty. The                            patient tolerated the procedure well. The quality                            of the bowel preparation was excellent. The                            ileocecal valve, appendiceal orifice, and rectum                            were photographed. Scope In: 1:33:47 PM Scope Out: 1:58:41 PM Scope Withdrawal Time: 0 hours 19 minutes 19 seconds  Total Procedure Duration: 0 hours 24 minutes 54 seconds  Findings:                 The perianal and digital rectal examinations were                            normal.                           A 1 mm polyp was found in the ileocecal valve. The                            polyp was sessile. The polyp was removed with a                            cold biopsy forceps. Resection and retrieval were                            complete.  A 5 mm polyp was found in the sigmoid colon. The                            polyp was sessile. The polyp was removed with a                            cold snare. Resection and retrieval were complete.                           Scattered small and large-mouthed diverticula were                            found in the sigmoid colon and ascending colon.                           Non-bleeding internal hemorrhoids were found during                            retroflexion. The hemorrhoids were small. Complications:            No immediate complications. Estimated Blood Loss:     Estimated blood loss was minimal. Impression:               - One 1 mm polyp at the ileocecal valve, removed                            with a cold biopsy forceps. Resected and retrieved.                           - One 5 mm polyp in the sigmoid colon, removed with                            a cold snare. Resected and retrieved.                           -  Mild diverticulosis in the sigmoid colon and in                            the ascending colon.                           - Non-bleeding internal hemorrhoids. Recommendation:           - Patient has a contact number available for                            emergencies. The signs and symptoms of potential                            delayed complications were discussed with the                            patient. Return to normal activities tomorrow.  Written discharge instructions were provided to the                            patient.                           - Resume previous diet.                           - Continue present medications.                           - Await pathology results.                           - Repeat colonoscopy in 5-10 years for surveillance                            based on pathology results. Mauri Pole, MD 12/27/2017 2:04:41 PM This report has been signed electronically.

## 2017-12-28 ENCOUNTER — Telehealth: Payer: Self-pay | Admitting: *Deleted

## 2017-12-28 NOTE — Telephone Encounter (Signed)
  Follow up Call-  Call back number 12/27/2017  Post procedure Call Back phone  # [phone]  Permission to leave phone message Yes  Some recent data might be hidden     Patient questions:  Do you have a fever, pain , or abdominal swelling? No. Pain Score  0 *  Have you tolerated food without any problems? Yes.    Have you been able to return to your normal activities? Yes.    Do you have any questions about your discharge instructions: Diet   No. Medications  No. Follow up visit  No.  Do you have questions or concerns about your Care? No.  Actions: * If pain score is 4 or above: No action needed, pain <4.

## 2018-01-05 ENCOUNTER — Encounter: Payer: Self-pay | Admitting: Gastroenterology

## 2018-05-06 DIAGNOSIS — R079 Chest pain, unspecified: Secondary | ICD-10-CM | POA: Diagnosis not present

## 2018-05-06 DIAGNOSIS — R0789 Other chest pain: Secondary | ICD-10-CM | POA: Diagnosis not present

## 2018-05-06 DIAGNOSIS — D72829 Elevated white blood cell count, unspecified: Secondary | ICD-10-CM | POA: Diagnosis not present

## 2018-05-06 DIAGNOSIS — R599 Enlarged lymph nodes, unspecified: Secondary | ICD-10-CM | POA: Diagnosis not present

## 2018-05-06 DIAGNOSIS — E785 Hyperlipidemia, unspecified: Secondary | ICD-10-CM | POA: Diagnosis not present

## 2018-05-06 DIAGNOSIS — R739 Hyperglycemia, unspecified: Secondary | ICD-10-CM | POA: Diagnosis not present

## 2018-05-06 DIAGNOSIS — Z7982 Long term (current) use of aspirin: Secondary | ICD-10-CM | POA: Diagnosis not present

## 2018-05-19 DIAGNOSIS — I10 Essential (primary) hypertension: Secondary | ICD-10-CM | POA: Diagnosis not present

## 2019-04-16 ENCOUNTER — Emergency Department (HOSPITAL_BASED_OUTPATIENT_CLINIC_OR_DEPARTMENT_OTHER)
Admission: EM | Admit: 2019-04-16 | Discharge: 2019-04-16 | Disposition: A | Discharge status: Home / Self Care | Payer: 59 | Attending: Emergency Medicine | Admitting: Emergency Medicine

## 2019-04-16 ENCOUNTER — Encounter (HOSPITAL_BASED_OUTPATIENT_CLINIC_OR_DEPARTMENT_OTHER): Payer: Self-pay | Admitting: Emergency Medicine

## 2019-04-16 ENCOUNTER — Emergency Department (HOSPITAL_BASED_OUTPATIENT_CLINIC_OR_DEPARTMENT_OTHER): Payer: 59

## 2019-04-16 DIAGNOSIS — Z79899 Other long term (current) drug therapy: Secondary | ICD-10-CM | POA: Diagnosis not present

## 2019-04-16 DIAGNOSIS — W293XXA Contact with powered garden and outdoor hand tools and machinery, initial encounter: Secondary | ICD-10-CM | POA: Insufficient documentation

## 2019-04-16 DIAGNOSIS — Y9289 Other specified places as the place of occurrence of the external cause: Secondary | ICD-10-CM | POA: Insufficient documentation

## 2019-04-16 DIAGNOSIS — Y93H2 Activity, gardening and landscaping: Secondary | ICD-10-CM | POA: Diagnosis not present

## 2019-04-16 DIAGNOSIS — S56521A Laceration of other extensor muscle, fascia and tendon at forearm level, right arm, initial encounter: Secondary | ICD-10-CM | POA: Diagnosis not present

## 2019-04-16 DIAGNOSIS — Y999 Unspecified external cause status: Secondary | ICD-10-CM | POA: Diagnosis not present

## 2019-04-16 DIAGNOSIS — Z7982 Long term (current) use of aspirin: Secondary | ICD-10-CM | POA: Insufficient documentation

## 2019-04-16 DIAGNOSIS — S51811A Laceration without foreign body of right forearm, initial encounter: Secondary | ICD-10-CM | POA: Diagnosis not present

## 2019-04-16 MED ORDER — CEFAZOLIN SODIUM-DEXTROSE 1-4 GM/50ML-% IV SOLN
1.0000 g | Freq: Once | INTRAVENOUS | Status: AC
  Administered 2019-04-16: 1 g via INTRAVENOUS
  Filled 2019-04-16: qty 50

## 2019-04-16 MED ORDER — LIDOCAINE-EPINEPHRINE 2 %-1:100000 IJ SOLN
30.0000 mL | Freq: Once | INTRAMUSCULAR | Status: DC
  Filled 2019-04-16: qty 30

## 2019-04-16 MED ORDER — LIDOCAINE-EPINEPHRINE (PF) 1 %-1:200000 IJ SOLN
INTRAMUSCULAR | Status: AC
  Administered 2019-04-16: 20:00:00
  Filled 2019-04-16: qty 10

## 2019-04-16 MED ORDER — CEPHALEXIN 500 MG PO CAPS: 500.0000 mg | ORAL_CAPSULE | Freq: Three times a day (TID) | ORAL | 0 refills | Status: DC

## 2019-04-16 MED ORDER — HYDROCODONE-ACETAMINOPHEN 5-325 MG PO TABS: 1.0000 | ORAL_TABLET | Freq: Four times a day (QID) | ORAL | 0 refills | Status: DC | PRN

## 2019-04-16 NOTE — Discharge Instructions (Addendum)
Call Dr. Bertis Ruddy office to arrange follow-up in the next 1 to 2 days for reevaluation of wound and likely surgical repair.

## 2019-04-16 NOTE — ED Provider Notes (Signed)
[facility] EMERGENCY DEPARTMENT Provider Note   CSN: [id number] Arrival date & time: 04/16/19  6553    History   Chief Complaint Chief Complaint  Patient presents with  . Laceration    HPI Brian Kaiser is a 63 y.o. male.     HPI Patient is right-hand dominant.  Unknown last tetanus.  Sustained deep laceration to the right mid forearm around 430.  States weedeater was equipped with a metal blade which cut through his forearm.  He is having difficulty extending the wrist laterally.  Denies any other injury. Past Medical History:  Diagnosis Date  . GERD (gastroesophageal reflux disease)   . Hepatitis B   . Hyperlipidemia   . Pulmonary lesion    Left pulmonary schwannoma    Patient Active Problem List   Diagnosis Date Noted  . NAUSEA 11/02/2008  . ABDOMINAL BLOATING 11/02/2008  . ABDOMINAL PAIN, RIGHT UPPER QUADRANT 11/02/2008  . IRRITABLE BOWEL SYNDROME 10/18/2008  . RECTAL FISSURE 07/25/2008  . COLONIC POLYPS, BENIGN, HX OF 07/25/2008    Past Surgical History:  Procedure Laterality Date  . LIPOMA EXCISION Left    neck  . LUNG SURGERY Left   . OTHER SURGICAL HISTORY Left    pulmonary schwannoma excision        Home Medications    Prior to Admission medications   Medication Sig Start Date End Date Taking? Authorizing Provider  aspirin 81 MG tablet Take 81 mg by mouth daily.    [provider]  cephALEXin (KEFLEX) 500 MG capsule Take 1 capsule (500 mg total) by mouth 3 (three) times daily. 04/16/19   Julianne Rice, MD  HYDROcodone-acetaminophen (NORCO) 5-325 MG tablet Take 1 tablet by mouth every 6 (six) hours as needed for severe pain. 04/16/19   Julianne Rice, MD  Multiple Vitamin (MULTIVITAMIN) tablet Take 1 tablet by mouth daily.    [provider]  Omega-3 Fatty Acids (FISH OIL PO) Take by mouth.    [provider]    Family History Family History  Problem Relation Age of Onset  . Macular degeneration Mother    . Dementia Mother   . Diverticulitis Mother   . Prostate cancer Paternal Grandfather   . Diverticulitis Brother   . Colon cancer Neg Hx   . Colon polyps Neg Hx   . Esophageal cancer Neg Hx   . Liver cancer Neg Hx   . Pancreatic cancer Neg Hx   . Rectal cancer Neg Hx   . Stomach cancer Neg Hx     Social History Social History   Tobacco Use  . Smoking status: Never Smoker  . Smokeless tobacco: Never Used  Substance Use Topics  . Alcohol use: No    Alcohol/week: 0.0 standard drinks    Frequency: Never  . Drug use: No     Allergies   Augmentin [amoxicillin-pot clavulanate]   Review of Systems Review of Systems  Musculoskeletal: Negative for back pain and neck pain.  Skin: Positive for wound.  Neurological: Positive for weakness. Negative for syncope, numbness and headaches.  All other systems reviewed and are negative.    Physical Exam Updated Vital Signs BP (!) 169/104 (BP Location: Left Arm)   Pulse 72   Temp 98.1 F (36.7 C) (Oral)   Resp 18   Ht 5\' 10"  (1.778 m)   Wt 86.2 kg   SpO2 100%   BMI 27.26 kg/m   Physical Exam Vitals signs and nursing note reviewed.  Constitutional:  Appearance: Normal appearance. He is well-developed.  HENT:     Head: Normocephalic and atraumatic.  Eyes:     Pupils: Pupils are equal, round, and reactive to light.  Neck:     Musculoskeletal: Normal range of motion and neck supple.  Cardiovascular:     Rate and Rhythm: Normal rate.  Pulmonary:     Effort: Pulmonary effort is normal.  Abdominal:     Palpations: Abdomen is soft.  Musculoskeletal: Normal range of motion.        General: No tenderness.     Comments: Roughly 15 cm laceration to the ulnar surface of the dorsum of the mid forearm.  Laceration is deep into the mid forearm.  Appears to have bisected the extensor carpi ulnaris.  Minimal active bleeding.  Skin:    General: Skin is warm and dry.     Findings: No erythema or rash.  Neurological:     Mental  Status: He is alert and oriented to person, place, and time.     Comments: Patient appears to have normal strength with extension of the wrist.  Definite weakness with abduction of the wrist.  Sensation is intact.  Psychiatric:        Behavior: Behavior normal.      ED Treatments / Results  Labs (all labs ordered are listed, but only abnormal results are displayed) Labs Reviewed - No data to display  EKG None  Radiology Dg Forearm Right  Result Date: 04/16/2019 CLINICAL DATA:  Trip and fall, laceration EXAM: RIGHT FOREARM - 2 VIEW COMPARISON:  None. FINDINGS: No fracture or dislocation of the right tibia or fibula. Laceration about the posterior mid forearm. No radiopaque foreign body identified. IMPRESSION: No fracture or dislocation of the right tibia or fibula. Laceration about the posterior mid forearm. No radiopaque foreign body identified. Electronically Signed   By: Eddie Candle M.D.   On: 04/16/2019 18:58    Procedures .Marland KitchenLaceration Repair  Date/Time: 04/16/2019 10:10 PM Performed by: Julianne Rice, MD Authorized by: Julianne Rice, MD   Consent:    Consent obtained:  Verbal   Consent given by:  Patient Anesthesia (see MAR for exact dosages):    Anesthesia method:  Local infiltration   Local anesthetic:  Lidocaine 1% WITH epi Laceration details:    Location:  Shoulder/arm   Shoulder/arm location:  R lower arm   Length (cm):  15   Depth (mm):  40 Repair type:    Repair type:  Simple Pre-procedure details:    Preparation:  Patient was prepped and draped in usual sterile fashion Exploration:    Wound exploration: wound explored through full range of motion     Wound extent: fascia violated and muscle damage     Wound extent: no foreign bodies/material noted     Contaminated: no   Treatment:    Area cleansed with:  Saline   Amount of cleaning:  Extensive   Irrigation solution:  Sterile saline   Irrigation volume:  2000   Irrigation method:  Pressure wash    Visualized foreign bodies/material removed: no   Skin repair:    Repair method:  Sutures   Suture size:  3-0   Suture material:  Prolene   Number of sutures:  6 Approximation:    Approximation:  Loose Post-procedure details:    Dressing:  Non-adherent dressing   Patient tolerance of procedure:  Tolerated well, no immediate complications   (including critical care time)  Medications Ordered in ED Medications  ceFAZolin (  ANCEF) IVPB 1 g/50 mL premix (0 g Intravenous Stopped 04/16/19 2102)  lidocaine-EPINEPHrine (XYLOCAINE-EPINEPHrine) 1 %-1:200000 (PF) injection (  Given 04/16/19 2027)     Initial Impression / Assessment and Plan / ED Course  I have reviewed the triage vital signs and the nursing notes.  Pertinent labs & imaging results that were available during my care of the patient were reviewed by me and considered in my medical decision making (see chart for details).        Complex laceration which will likely need repair by orthopedic specialist.  Discussed extensively with Dr. Griffin Basil who also reviewed images of the laceration on the patient's chart.  Advises to wash the wound out in the emergency department, give prophylactic antibiotics and close.  He will arrange to have the patient follow-up with Dr. Burney Gauze on Tuesday for likely surgical repair.  Tetanus is been updated and Ancef given.  Wound was thoroughly washed out with 2 L of normal saline.  Bleeding controlled.  Wound was then superficially closed.  Patient understands the need to follow-up closely with Dr. Burney Gauze and has his contact information. Final Clinical Impressions(s) / ED Diagnoses   Final diagnoses:  Extensor tendon laceration of right forearm with open wound, initial encounter    ED Discharge Orders         Ordered    cephALEXin (KEFLEX) 500 MG capsule  3 times daily     04/16/19 2055    HYDROcodone-acetaminophen (NORCO) 5-325 MG tablet  Every 6 hours PRN     04/16/19 2055            Julianne Rice, MD 04/16/19 2212

## 2019-04-16 NOTE — ED Triage Notes (Signed)
Cut right arm on weed eater x 1 hr ago , CMS intact to right fingers. Has dressing DDT

## 2019-04-16 NOTE — ED Notes (Signed)
Updated pts wife (edna) and daughter Sherrine Maples) on pt status.  Also, obtained pts phone from them to give to pt.

## 2019-04-18 ENCOUNTER — Other Ambulatory Visit (HOSPITAL_COMMUNITY)
Admission: RE | Admit: 2019-04-18 | Discharge: 2019-04-18 | Disposition: A | Discharge status: Home / Self Care | Payer: 59 | Source: Ambulatory Visit | Attending: Orthopedic Surgery | Admitting: Orthopedic Surgery

## 2019-04-18 ENCOUNTER — Other Ambulatory Visit: Payer: Self-pay | Admitting: Orthopedic Surgery

## 2019-04-18 ENCOUNTER — Other Ambulatory Visit: Payer: Self-pay

## 2019-04-18 ENCOUNTER — Encounter (HOSPITAL_BASED_OUTPATIENT_CLINIC_OR_DEPARTMENT_OTHER): Payer: Self-pay | Admitting: *Deleted

## 2019-04-18 DIAGNOSIS — S56522A Laceration of other extensor muscle, fascia and tendon at forearm level, left arm, initial encounter: Secondary | ICD-10-CM | POA: Diagnosis not present

## 2019-04-18 DIAGNOSIS — Z20828 Contact with and (suspected) exposure to other viral communicable diseases: Secondary | ICD-10-CM | POA: Diagnosis not present

## 2019-04-19 ENCOUNTER — Encounter (HOSPITAL_BASED_OUTPATIENT_CLINIC_OR_DEPARTMENT_OTHER)
Admission: RE | Disposition: A | Discharge status: Home / Self Care | Payer: Self-pay | Source: Intra-hospital | Attending: Orthopedic Surgery

## 2019-04-19 ENCOUNTER — Ambulatory Visit (HOSPITAL_BASED_OUTPATIENT_CLINIC_OR_DEPARTMENT_OTHER)
Admission: RE | Admit: 2019-04-19 | Discharge: 2019-04-19 | Disposition: A | Discharge status: Home / Self Care | Payer: 59 | Source: Intra-hospital | Attending: Orthopedic Surgery | Admitting: Orthopedic Surgery

## 2019-04-19 ENCOUNTER — Encounter (HOSPITAL_BASED_OUTPATIENT_CLINIC_OR_DEPARTMENT_OTHER): Payer: Self-pay | Admitting: *Deleted

## 2019-04-19 ENCOUNTER — Ambulatory Visit (HOSPITAL_BASED_OUTPATIENT_CLINIC_OR_DEPARTMENT_OTHER): Payer: 59 | Admitting: Certified Registered"

## 2019-04-19 ENCOUNTER — Other Ambulatory Visit: Payer: Self-pay

## 2019-04-19 DIAGNOSIS — Z902 Acquired absence of lung [part of]: Secondary | ICD-10-CM | POA: Insufficient documentation

## 2019-04-19 DIAGNOSIS — Z820 Family history of epilepsy and other diseases of the nervous system: Secondary | ICD-10-CM | POA: Insufficient documentation

## 2019-04-19 DIAGNOSIS — K219 Gastro-esophageal reflux disease without esophagitis: Secondary | ICD-10-CM | POA: Insufficient documentation

## 2019-04-19 DIAGNOSIS — Z881 Allergy status to other antibiotic agents status: Secondary | ICD-10-CM | POA: Diagnosis not present

## 2019-04-19 DIAGNOSIS — Z880 Allergy status to penicillin: Secondary | ICD-10-CM | POA: Diagnosis not present

## 2019-04-19 DIAGNOSIS — S51811A Laceration without foreign body of right forearm, initial encounter: Secondary | ICD-10-CM | POA: Diagnosis not present

## 2019-04-19 DIAGNOSIS — Z8619 Personal history of other infectious and parasitic diseases: Secondary | ICD-10-CM | POA: Insufficient documentation

## 2019-04-19 DIAGNOSIS — S56521A Laceration of other extensor muscle, fascia and tendon at forearm level, right arm, initial encounter: Secondary | ICD-10-CM | POA: Insufficient documentation

## 2019-04-19 DIAGNOSIS — Z8042 Family history of malignant neoplasm of prostate: Secondary | ICD-10-CM | POA: Insufficient documentation

## 2019-04-19 DIAGNOSIS — X58XXXA Exposure to other specified factors, initial encounter: Secondary | ICD-10-CM | POA: Diagnosis not present

## 2019-04-19 DIAGNOSIS — E785 Hyperlipidemia, unspecified: Secondary | ICD-10-CM | POA: Diagnosis not present

## 2019-04-19 DIAGNOSIS — Z8379 Family history of other diseases of the digestive system: Secondary | ICD-10-CM | POA: Diagnosis not present

## 2019-04-19 DIAGNOSIS — Z79899 Other long term (current) drug therapy: Secondary | ICD-10-CM | POA: Diagnosis not present

## 2019-04-19 HISTORY — PX: LIGAMENT REPAIR: SHX5444

## 2019-04-19 HISTORY — DX: Benign neoplasm of peripheral nerves and autonomic nervous system, unspecified: D36.10

## 2019-04-19 HISTORY — PX: WOUND EXPLORATION: SHX6188

## 2019-04-19 HISTORY — DX: Laceration without foreign body of unspecified forearm, initial encounter: S51.819A

## 2019-04-19 LAB — SARS CORONAVIRUS 2 (TAT 6-24 HRS): SARS Coronavirus 2: NEGATIVE

## 2019-04-19 SURGERY — WOUND EXPLORATION
Anesthesia: Monitor Anesthesia Care | Site: Arm Lower | Laterality: Right

## 2019-04-19 MED ORDER — 0.9 % SODIUM CHLORIDE (POUR BTL) OPTIME
TOPICAL | Status: DC | PRN
  Administered 2019-04-19: 200 mL

## 2019-04-19 MED ORDER — MIDAZOLAM HCL 2 MG/2ML IJ SOLN
INTRAMUSCULAR | Status: AC
  Filled 2019-04-19: qty 2

## 2019-04-19 MED ORDER — VANCOMYCIN HCL IN DEXTROSE 1-5 GM/200ML-% IV SOLN
INTRAVENOUS | Status: AC
  Filled 2019-04-19: qty 200

## 2019-04-19 MED ORDER — FENTANYL CITRATE (PF) 100 MCG/2ML IJ SOLN
INTRAMUSCULAR | Status: AC
  Filled 2019-04-19: qty 2

## 2019-04-19 MED ORDER — SCOPOLAMINE 1 MG/3DAYS TD PT72: 1.0000 | MEDICATED_PATCH | Freq: Once | TRANSDERMAL | Status: DC

## 2019-04-19 MED ORDER — VANCOMYCIN HCL IN DEXTROSE 1-5 GM/200ML-% IV SOLN
1000.0000 mg | INTRAVENOUS | Status: AC
  Administered 2019-04-19: 15:00:00 1000 mg via INTRAVENOUS

## 2019-04-19 MED ORDER — LIDOCAINE HCL (CARDIAC) PF 100 MG/5ML IV SOSY
PREFILLED_SYRINGE | INTRAVENOUS | Status: DC | PRN
  Administered 2019-04-19: 30 mg via INTRAVENOUS

## 2019-04-19 MED ORDER — OXYCODONE HCL 5 MG PO TABS: 5.0000 mg | ORAL_TABLET | Freq: Once | ORAL | Status: DC | PRN

## 2019-04-19 MED ORDER — MIDAZOLAM HCL 2 MG/2ML IJ SOLN
1.0000 mg | INTRAMUSCULAR | Status: DC | PRN
  Administered 2019-04-19: 2 mg via INTRAVENOUS

## 2019-04-19 MED ORDER — PROPOFOL 500 MG/50ML IV EMUL
INTRAVENOUS | Status: DC | PRN
  Administered 2019-04-19: 100 ug/kg/min via INTRAVENOUS

## 2019-04-19 MED ORDER — FENTANYL CITRATE (PF) 100 MCG/2ML IJ SOLN: 25.0000 ug | INTRAMUSCULAR | Status: DC | PRN

## 2019-04-19 MED ORDER — LACTATED RINGERS IV SOLN
INTRAVENOUS | Status: DC
  Administered 2019-04-19: 15:00:00 via INTRAVENOUS

## 2019-04-19 MED ORDER — PROMETHAZINE HCL 25 MG/ML IJ SOLN: 6.2500 mg | INTRAMUSCULAR | Status: DC | PRN

## 2019-04-19 MED ORDER — FENTANYL CITRATE (PF) 100 MCG/2ML IJ SOLN
50.0000 ug | INTRAMUSCULAR | Status: DC | PRN
  Administered 2019-04-19: 50 ug via INTRAVENOUS

## 2019-04-19 MED ORDER — CHLORHEXIDINE GLUCONATE 4 % EX LIQD: 60.0000 mL | Freq: Once | CUTANEOUS | Status: DC

## 2019-04-19 MED ORDER — OXYCODONE HCL 5 MG/5ML PO SOLN: 5.0000 mg | Freq: Once | ORAL | Status: DC | PRN

## 2019-04-19 MED ORDER — ROPIVACAINE HCL 7.5 MG/ML IJ SOLN
INTRAMUSCULAR | Status: DC | PRN
  Administered 2019-04-19: 20 mL via PERINEURAL

## 2019-04-19 MED ORDER — ONDANSETRON HCL 4 MG/2ML IJ SOLN
INTRAMUSCULAR | Status: DC | PRN
  Administered 2019-04-19: 4 mg via INTRAVENOUS

## 2019-04-19 SURGICAL SUPPLY — 68 items
APL SKNCLS STERI-STRIP NONHPOA (GAUZE/BANDAGES/DRESSINGS)
BAG DECANTER FOR FLEXI CONT (MISCELLANEOUS) IMPLANT
BENZOIN TINCTURE PRP APPL 2/3 (GAUZE/BANDAGES/DRESSINGS) IMPLANT
BLADE MINI RND TIP GREEN BEAV (BLADE) IMPLANT
BLADE SURG 15 STRL LF DISP TIS (BLADE) ×1 IMPLANT
BLADE SURG 15 STRL SS (BLADE) ×4
BNDG CMPR 9X4 STRL LF SNTH (GAUZE/BANDAGES/DRESSINGS) ×1
BNDG COHESIVE 1X5 TAN STRL LF (GAUZE/BANDAGES/DRESSINGS) IMPLANT
BNDG ELASTIC 2X5.8 VLCR STR LF (GAUZE/BANDAGES/DRESSINGS) ×2 IMPLANT
BNDG ELASTIC 4X5.8 VLCR STR LF (GAUZE/BANDAGES/DRESSINGS) ×2 IMPLANT
BNDG ESMARK 4X9 LF (GAUZE/BANDAGES/DRESSINGS) ×1 IMPLANT
CORD BIPOLAR FORCEPS 12FT (ELECTRODE) ×2 IMPLANT
COVER BACK TABLE REUSABLE LG (DRAPES) ×2 IMPLANT
COVER WAND RF STERILE (DRAPES) IMPLANT
CUFF TOURN SGL QUICK 18X4 (TOURNIQUET CUFF) ×1 IMPLANT
DECANTER SPIKE VIAL GLASS SM (MISCELLANEOUS) IMPLANT
DRAPE EXTREMITY T 121X128X90 (DISPOSABLE) ×2 IMPLANT
DRAPE HALF SHEET 70X43 (DRAPES) ×2 IMPLANT
DRAPE SURG 17X23 STRL (DRAPES) ×2 IMPLANT
DURAPREP 26ML APPLICATOR (WOUND CARE) ×2 IMPLANT
GAUZE 4X4 16PLY RFD (DISPOSABLE) IMPLANT
GAUZE SPONGE 4X4 12PLY STRL (GAUZE/BANDAGES/DRESSINGS) ×2 IMPLANT
GAUZE XEROFORM 1X8 LF (GAUZE/BANDAGES/DRESSINGS) ×2 IMPLANT
GLOVE SURG SYN 8.0 (GLOVE) ×4 IMPLANT
GLOVE SURG SYN 8.0 PF PI (GLOVE) ×2 IMPLANT
GOWN STRL REIN XL XLG (GOWN DISPOSABLE) ×4 IMPLANT
GOWN STRL REUS W/ TWL LRG LVL3 (GOWN DISPOSABLE) ×1 IMPLANT
GOWN STRL REUS W/TWL LRG LVL3 (GOWN DISPOSABLE) ×2
IV LACTATED RINGERS 500ML (IV SOLUTION) ×1 IMPLANT
NDL HYPO 25X1 1.5 SAFETY (NEEDLE) ×1 IMPLANT
NEEDLE HYPO 22GX1.5 SAFETY (NEEDLE) IMPLANT
NEEDLE HYPO 25X1 1.5 SAFETY (NEEDLE) IMPLANT
NS IRRIG 1000ML POUR BTL (IV SOLUTION) ×2 IMPLANT
PACK BASIN DAY SURGERY FS (CUSTOM PROCEDURE TRAY) ×2 IMPLANT
PAD CAST 3X4 CTTN HI CHSV (CAST SUPPLIES) ×1 IMPLANT
PAD CAST 4YDX4 CTTN HI CHSV (CAST SUPPLIES) IMPLANT
PADDING CAST ABS 4INX4YD NS (CAST SUPPLIES)
PADDING CAST ABS COTTON 4X4 ST (CAST SUPPLIES) ×1 IMPLANT
PADDING CAST COTTON 3X4 STRL (CAST SUPPLIES) ×2
PADDING CAST COTTON 4X4 STRL (CAST SUPPLIES) ×4
PADDING UNDERCAST 2 STRL (CAST SUPPLIES)
PADDING UNDERCAST 2X4 STRL (CAST SUPPLIES) IMPLANT
PASSER SUT SWANSON 36MM LOOP (INSTRUMENTS) IMPLANT
SLING ARM FOAM STRAP LRG (SOFTGOODS) ×1 IMPLANT
SPEAR EYE SURG WECK-CEL (MISCELLANEOUS) IMPLANT
SPLINT PLASTER CAST XFAST 4X15 (CAST SUPPLIES) ×15 IMPLANT
SPLINT PLASTER XTRA FAST SET 4 (CAST SUPPLIES) ×15
STOCKINETTE 4X48 STRL (DRAPES) ×2 IMPLANT
STRIP CLOSURE SKIN 1/2X4 (GAUZE/BANDAGES/DRESSINGS) IMPLANT
SUT ETHIBOND 3-0 V-5 (SUTURE) IMPLANT
SUT ETHILON 3 0 PS 1 (SUTURE) ×3 IMPLANT
SUT ETHILON 5 0 PS 2 18 (SUTURE) IMPLANT
SUT FIBERWIRE #2 38 T-5 BLUE (SUTURE) ×2
SUT FIBERWIRE 3-0 18 TAPR NDL (SUTURE) ×2
SUT NYLON 9 0 VRM6 (SUTURE) IMPLANT
SUT PROLENE 3 0 PS 2 (SUTURE) IMPLANT
SUT PROLENE 6 0 P 1 18 (SUTURE) IMPLANT
SUT VIC AB 2-0 SH 27 (SUTURE) ×4
SUT VIC AB 2-0 SH 27XBRD (SUTURE) IMPLANT
SUT VICRYL RAPIDE 4-0 (SUTURE) IMPLANT
SUT VICRYL RAPIDE 4/0 PS 2 (SUTURE) IMPLANT
SUTURE FIBERWR #2 38 T-5 BLUE (SUTURE) IMPLANT
SUTURE FIBERWR 3-0 18 TAPR NDL (SUTURE) ×1 IMPLANT
SYR BULB 3OZ (MISCELLANEOUS) ×2 IMPLANT
SYR CONTROL 10ML LL (SYRINGE) IMPLANT
TOWEL GREEN STERILE FF (TOWEL DISPOSABLE) ×2 IMPLANT
TUBE FEEDING ENTERAL 5FR 16IN (TUBING) IMPLANT
UNDERPAD 30X30 (UNDERPADS AND DIAPERS) ×2 IMPLANT

## 2019-04-19 NOTE — Anesthesia Procedure Notes (Signed)
Anesthesia Regional Block: Supraclavicular block   Pre-Anesthetic Checklist: ,, timeout performed, Correct Patient, Correct Site, Correct Laterality, Correct Procedure, Correct Position, site marked, Risks and benefits discussed,  Surgical consent,  Pre-op evaluation,  At surgeon's request and post-op pain management  Laterality: Right  Prep: chloraprep       Needles:  Injection technique: Single-shot  Needle Type: Echogenic Needle     Needle Length: 5cm  Needle Gauge: 21     Additional Needles:   Narrative:  Start time: 04/19/2019 2:32 PM End time: 04/19/2019 2:36 PM Injection made incrementally with aspirations every 5 mL.  Performed by: Personally  Anesthesiologist: Audry Pili, MD  Additional Notes: No pain on injection. No increased resistance to injection. Injection made in 5cc increments. Good needle visualization. Patient tolerated the procedure well.

## 2019-04-19 NOTE — Transfer of Care (Signed)
Immediate Anesthesia Transfer of Care Note  Patient: Brian Kaiser  Procedure(s) Performed: WOUND EXPLORATION REPAIR AS NEEDED RIGHT FOREARM LACERATION (Right )  Patient Location: PACU  Anesthesia Type:MAC combined with regional for post-op pain  Level of Consciousness: awake, alert , oriented and patient cooperative  Airway & Oxygen Therapy: Patient Spontanous Breathing and Patient connected to face mask oxygen  Post-op Assessment: Report given to RN and Post -op Vital signs reviewed and stable  Post vital signs: Reviewed and stable  Last Vitals:  Vitals Value Taken Time  BP    Temp    Pulse    Resp 20 04/19/19 1548  SpO2    Vitals shown include unvalidated device data.  Last Pain:  Vitals:   04/19/19 1338  TempSrc: Oral  PainSc: 0-No pain         Complications: No apparent anesthesia complications

## 2019-04-19 NOTE — Anesthesia Procedure Notes (Signed)
Procedure Name: MAC Date/Time: 04/19/2019 3:19 PM Performed by: Signe Colt, CRNA Pre-anesthesia Checklist: Patient identified, Emergency Drugs available, Suction available, Patient being monitored and Timeout performed Patient Re-evaluated:Patient Re-evaluated prior to induction Oxygen Delivery Method: Simple face mask

## 2019-04-19 NOTE — H&P (Signed)
Brian Kaiser is an 63 y.o. male.   Chief Complaint: Right forearm deep laceration HPI: Patient is a very pleasant 63 year old male right-hand-dominant with deep laceration mid proximal forearm on the right with weakness in wrist extension and ulnar deviation  Past Medical History:  Diagnosis Date  . Forearm laceration    right forearm  . GERD (gastroesophageal reflux disease)   . Hepatitis B   . Hyperlipidemia   . Pulmonary lesion    Left pulmonary schwannoma  . Schwannoma    left lower lung    Past Surgical History:  Procedure Laterality Date  . LIPOMA EXCISION Left    neck  . LUNG SURGERY Left   . OTHER SURGICAL HISTORY Left    pulmonary schwannoma excision    Family History  Problem Relation Age of Onset  . Macular degeneration Mother   . Dementia Mother   . Diverticulitis Mother   . Prostate cancer Paternal Grandfather   . Diverticulitis Brother   . Colon cancer Neg Hx   . Colon polyps Neg Hx   . Esophageal cancer Neg Hx   . Liver cancer Neg Hx   . Pancreatic cancer Neg Hx   . Rectal cancer Neg Hx   . Stomach cancer Neg Hx    Social History:  reports that he has never smoked. He has never used smokeless tobacco. He reports that he does not drink alcohol or use drugs.  Allergies:  Allergies  Allergen Reactions  . Augmentin [Amoxicillin-Pot Clavulanate] Rash    Pt states he had a rash from augmentin 29 years ago.    Medications Prior to Admission  Medication Sig Dispense Refill  . cephALEXin (KEFLEX) 500 MG capsule Take 1 capsule (500 mg total) by mouth 3 (three) times daily. 21 capsule 0  . HYDROcodone-acetaminophen (NORCO) 5-325 MG tablet Take 1 tablet by mouth every 6 (six) hours as needed for severe pain. 10 tablet 0  . Multiple Vitamin (MULTIVITAMIN) tablet Take 1 tablet by mouth daily.    . Omega-3 Fatty Acids (FISH OIL PO) Take by mouth.      Results for orders placed or performed during the hospital encounter of 04/18/19 (from the past 48 hour(s))   SARS Coronavirus 2 (Performed in [facility] hospital lab)     Status: None   Collection Time: 04/18/19  3:36 PM   Specimen: Nasal Swab  Result Value Ref Range   SARS Coronavirus 2 NEGATIVE NEGATIVE    Comment: (NOTE) SARS-CoV-2 target nucleic acids are NOT DETECTED. The SARS-CoV-2 RNA is generally detectable in upper and lower respiratory specimens during the acute phase of infection. Negative results do not preclude SARS-CoV-2 infection, do not rule out co-infections with other pathogens, and should not be used as the sole basis for treatment or other patient management decisions. Negative results must be combined with clinical observations, patient history, and epidemiological information. The expected result is Negative. Fact Sheet for Patients: SugarRoll.be Fact Sheet for Healthcare Providers: https://www.woods-mathews.com/ This test is not yet approved or cleared by the [place] FDA and  has been authorized for detection and/or diagnosis of SARS-CoV-2 by FDA under an Emergency Use Authorization (EUA). This EUA will remain  in effect (meaning this test can be used) for the duration of the COVID-19 declaration under Section 56 4(b)(1) of the Act, 21 U.S.C. section 360bbb-3(b)(1), unless the authorization is terminated or revoked sooner. Performed at [facility], [place] [address], [place], [place] [address]    No results found.  Review of Systems  All other systems reviewed and are negative.   Blood pressure (!) 151/99, pulse 64, temperature 98 F (36.7 C), temperature source Oral, resp. rate 20, height 5\' 10"  (1.778 m), weight 85.3 kg, SpO2 100 %. Physical Exam  Constitutional: He is oriented to person, place, and time. He appears well-developed and well-nourished.  HENT:  Head: Normocephalic and atraumatic.  Neck: Normal range of motion.  Cardiovascular: Normal rate.  Respiratory: Effort normal.  Musculoskeletal:      Right forearm: He exhibits swelling and laceration.     Comments: Right midshaft dorsal ulnar forearm laceration with weakness and wrist extension and ulnar deviation  Neurological: He is alert and oriented to person, place, and time.  Skin: Skin is warm.  Psychiatric: He has a normal mood and affect. His behavior is normal. Judgment and thought content normal.     Assessment/Plan 63 year old male with deep laceration with probable tendon injury to right forearm.  Have discussed the need for exploration and repair of vital structures right forearm.  Patient understands the risks and benefits and the postoperative rehabilitation necessary and wishes to proceed Schuyler Amor, MD 04/19/2019, 2:35 PM

## 2019-04-19 NOTE — Anesthesia Preprocedure Evaluation (Addendum)
Anesthesia Evaluation  Patient identified by MRN, date of birth, ID band Patient awake    Reviewed: Allergy & Precautions, NPO status , Patient's Chart, lab work & pertinent test results  History of Anesthesia Complications Negative for: history of anesthetic complications  Airway Mallampati: I  TM Distance: >3 FB Neck ROM: Full    Dental  (+) Dental Advisory Given, Teeth Intact   Pulmonary   Pulmonary schwannoma s/p lobectomy     breath sounds clear to auscultation       Cardiovascular Exercise Tolerance: Good negative cardio ROS   Rhythm:Regular Rate:Normal     Neuro/Psych negative neurological ROS  negative psych ROS   GI/Hepatic GERD  Controlled,(+) Hepatitis -, B    Endo/Other  negative endocrine ROS  Renal/GU negative Renal ROS     Musculoskeletal negative musculoskeletal ROS (+)   Abdominal   Peds  Hematology negative hematology ROS (+)   Anesthesia Other Findings   Reproductive/Obstetrics                            Anesthesia Physical Anesthesia Plan  ASA: II  Anesthesia Plan: MAC and Regional   Post-op Pain Management:  Regional for Post-op pain   Induction: Intravenous  PONV Risk Score and Plan: 1 and Treatment may vary due to age or medical condition, Propofol infusion and Ondansetron  Airway Management Planned: Simple Face Mask and Natural Airway  Additional Equipment: None  Intra-op Plan:   Post-operative Plan:   Informed Consent: I have reviewed the patients History and Physical, chart, labs and discussed the procedure including the risks, benefits and alternatives for the proposed anesthesia with the patient or authorized representative who has indicated his/her understanding and acceptance.       Plan Discussed with: CRNA and Anesthesiologist  Anesthesia Plan Comments:        Anesthesia Quick Evaluation

## 2019-04-19 NOTE — Brief Op Note (Signed)
04/19/2019  3:44 PM  PATIENT:  Corliss Parish  63 y.o. male  PRE-OPERATIVE DIAGNOSIS:  RIGHT FOREARM LACERATION  POST-OPERATIVE DIAGNOSIS:  RIGHT FOREARM LACERATION  PROCEDURE:  Procedure(s) with comments: WOUND EXPLORATION REPAIR AS NEEDED RIGHT FOREARM LACERATION (Right) - AXILLARY BLOCK AND MAC  SURGEON:  Surgeon(s) and Role:    Charlotte Crumb, MD - Primary  PHYSICIAN ASSISTANT:   ASSISTANTS: none   ANESTHESIA:   regional  EBL:  none  BLOOD ADMINISTERED:none  DRAINS: none   LOCAL MEDICATIONS USED:  NONE  SPECIMEN:  No Specimen  DISPOSITION OF SPECIMEN:  N/A  COUNTS:  YES  TOURNIQUET:  Less than 1 our  DICTATION: .Sales executive  PLAN OF CARE: Discharge to home after PACU  PATIENT DISPOSITION:  PACU - hemodynamically stable.   Delay start of Pharmacological VTE agent (>24hrs) due to surgical blood loss or risk of bleeding: not applicable

## 2019-04-19 NOTE — Op Note (Signed)
Patient was taken to the operating suite and after the induction of adequate regional anesthetic and IV sedation the right upper extremity was prepped and draped in the usual sterile fashion.  An Esmarch was used to exsanguinate the limb and the tourniquet was inflated 250 mmHg.  At this point time laceration transverse across the midshaft to proximal third of the forearm along the dorsal ulnar border was extended proximally and distally and gentle S fashion.  Dissection was carried down to the fascia overlying the extensor musculature proximally.  There was a deep laceration through the fascia involving the extensor carpi ulnaris muscle tendon junction as well as the ECRL musculotendinous junction.  These were both repaired using 0 FiberWire in horizontal mattress sutures followed by a running locked 2-0 Vicryl suture the wound was thoroughly irrigated and loosely closed in layers of 2-0 undyed Vicryl and 3-0 nylon on the skin.  Xeroform, 4 x 4's, and a splint with the wrist slightly extended and deviated to the ulnar side was applied.  The patient tolerated this procedure well went to come in stable fashion.

## 2019-04-19 NOTE — Anesthesia Postprocedure Evaluation (Signed)
Anesthesia Post Note  Patient: Brian Kaiser  Procedure(s) Performed: WOUND EXPLORATION; REPAIR RIGHT FOREARM LACERATION (Right Arm Lower)     Patient location during evaluation: PACU Anesthesia Type: Regional Level of consciousness: awake and alert Pain management: pain level controlled Vital Signs Assessment: post-procedure vital signs reviewed and stable Respiratory status: spontaneous breathing, nonlabored ventilation, respiratory function stable and patient connected to nasal cannula oxygen Cardiovascular status: stable and blood pressure returned to baseline Postop Assessment: no apparent nausea or vomiting Anesthetic complications: no    Last Vitals:  Vitals:   04/19/19 1550 04/19/19 1600  BP: 136/65 (!) 149/71  Pulse: (!) 59 (!) 54  Resp: 18 13  Temp: 36.4 C   SpO2: 100% 96%    Last Pain:  Vitals:   04/19/19 1600  TempSrc:   PainSc: 0-No pain                 Lamark Schue COKER

## 2019-04-19 NOTE — Discharge Instructions (Signed)

## 2019-04-19 NOTE — Progress Notes (Signed)
Assisted Dr. Brock with right, ultrasound guided, supraclavicular block. Side rails up, monitors on throughout procedure. See vital signs in flow sheet. Tolerated Procedure well. 

## 2019-04-20 ENCOUNTER — Encounter (HOSPITAL_BASED_OUTPATIENT_CLINIC_OR_DEPARTMENT_OTHER): Payer: Self-pay | Admitting: Orthopedic Surgery

## 2019-04-25 DIAGNOSIS — M79631 Pain in right forearm: Secondary | ICD-10-CM | POA: Diagnosis not present

## 2019-05-25 DIAGNOSIS — M6281 Muscle weakness (generalized): Secondary | ICD-10-CM | POA: Diagnosis not present

## 2019-05-25 DIAGNOSIS — M79631 Pain in right forearm: Secondary | ICD-10-CM | POA: Diagnosis not present

## 2019-05-25 DIAGNOSIS — S66329D Laceration of extensor muscle, fascia and tendon of unspecified finger at wrist and hand level, subsequent encounter: Secondary | ICD-10-CM | POA: Diagnosis not present

## 2019-05-25 DIAGNOSIS — M25631 Stiffness of right wrist, not elsewhere classified: Secondary | ICD-10-CM | POA: Diagnosis not present

## 2019-05-26 DIAGNOSIS — K219 Gastro-esophageal reflux disease without esophagitis: Secondary | ICD-10-CM | POA: Diagnosis not present

## 2019-05-26 DIAGNOSIS — E781 Pure hyperglyceridemia: Secondary | ICD-10-CM | POA: Diagnosis not present

## 2019-05-26 DIAGNOSIS — L723 Sebaceous cyst: Secondary | ICD-10-CM | POA: Diagnosis not present

## 2019-05-26 DIAGNOSIS — Z125 Encounter for screening for malignant neoplasm of prostate: Secondary | ICD-10-CM | POA: Diagnosis not present

## 2019-05-26 DIAGNOSIS — I10 Essential (primary) hypertension: Secondary | ICD-10-CM | POA: Diagnosis not present

## 2019-05-26 DIAGNOSIS — Z0000 Encounter for general adult medical examination without abnormal findings: Secondary | ICD-10-CM | POA: Diagnosis not present

## 2019-05-30 DIAGNOSIS — M6281 Muscle weakness (generalized): Secondary | ICD-10-CM | POA: Diagnosis not present

## 2019-05-30 DIAGNOSIS — M25631 Stiffness of right wrist, not elsewhere classified: Secondary | ICD-10-CM | POA: Diagnosis not present

## 2019-05-30 DIAGNOSIS — M79631 Pain in right forearm: Secondary | ICD-10-CM | POA: Diagnosis not present

## 2019-05-30 DIAGNOSIS — S66329D Laceration of extensor muscle, fascia and tendon of unspecified finger at wrist and hand level, subsequent encounter: Secondary | ICD-10-CM | POA: Diagnosis not present

## 2019-06-01 DIAGNOSIS — M79631 Pain in right forearm: Secondary | ICD-10-CM | POA: Diagnosis not present

## 2019-06-01 DIAGNOSIS — M6281 Muscle weakness (generalized): Secondary | ICD-10-CM | POA: Diagnosis not present

## 2019-06-01 DIAGNOSIS — M25631 Stiffness of right wrist, not elsewhere classified: Secondary | ICD-10-CM | POA: Diagnosis not present

## 2019-06-01 DIAGNOSIS — S66329D Laceration of extensor muscle, fascia and tendon of unspecified finger at wrist and hand level, subsequent encounter: Secondary | ICD-10-CM | POA: Diagnosis not present

## 2019-06-12 DIAGNOSIS — S66329D Laceration of extensor muscle, fascia and tendon of unspecified finger at wrist and hand level, subsequent encounter: Secondary | ICD-10-CM | POA: Diagnosis not present

## 2019-06-12 DIAGNOSIS — M79631 Pain in right forearm: Secondary | ICD-10-CM | POA: Diagnosis not present

## 2019-06-12 DIAGNOSIS — M6281 Muscle weakness (generalized): Secondary | ICD-10-CM | POA: Diagnosis not present

## 2019-06-12 DIAGNOSIS — M25631 Stiffness of right wrist, not elsewhere classified: Secondary | ICD-10-CM | POA: Diagnosis not present

## 2019-06-16 DIAGNOSIS — M25631 Stiffness of right wrist, not elsewhere classified: Secondary | ICD-10-CM | POA: Diagnosis not present

## 2019-06-16 DIAGNOSIS — M6281 Muscle weakness (generalized): Secondary | ICD-10-CM | POA: Diagnosis not present

## 2019-06-16 DIAGNOSIS — M79631 Pain in right forearm: Secondary | ICD-10-CM | POA: Diagnosis not present

## 2019-06-16 DIAGNOSIS — S66329D Laceration of extensor muscle, fascia and tendon of unspecified finger at wrist and hand level, subsequent encounter: Secondary | ICD-10-CM | POA: Diagnosis not present

## 2019-06-20 DIAGNOSIS — M79631 Pain in right forearm: Secondary | ICD-10-CM | POA: Diagnosis not present

## 2019-06-20 DIAGNOSIS — M6281 Muscle weakness (generalized): Secondary | ICD-10-CM | POA: Diagnosis not present

## 2019-06-20 DIAGNOSIS — M25631 Stiffness of right wrist, not elsewhere classified: Secondary | ICD-10-CM | POA: Diagnosis not present

## 2019-06-20 DIAGNOSIS — S66329D Laceration of extensor muscle, fascia and tendon of unspecified finger at wrist and hand level, subsequent encounter: Secondary | ICD-10-CM | POA: Diagnosis not present

## 2019-06-22 DIAGNOSIS — M25631 Stiffness of right wrist, not elsewhere classified: Secondary | ICD-10-CM | POA: Diagnosis not present

## 2019-06-22 DIAGNOSIS — M79631 Pain in right forearm: Secondary | ICD-10-CM | POA: Diagnosis not present

## 2019-06-22 DIAGNOSIS — M6281 Muscle weakness (generalized): Secondary | ICD-10-CM | POA: Diagnosis not present

## 2019-06-22 DIAGNOSIS — S66329D Laceration of extensor muscle, fascia and tendon of unspecified finger at wrist and hand level, subsequent encounter: Secondary | ICD-10-CM | POA: Diagnosis not present

## 2019-06-27 DIAGNOSIS — M6281 Muscle weakness (generalized): Secondary | ICD-10-CM | POA: Diagnosis not present

## 2019-06-27 DIAGNOSIS — S66329D Laceration of extensor muscle, fascia and tendon of unspecified finger at wrist and hand level, subsequent encounter: Secondary | ICD-10-CM | POA: Diagnosis not present

## 2019-06-27 DIAGNOSIS — M79631 Pain in right forearm: Secondary | ICD-10-CM | POA: Diagnosis not present

## 2019-06-27 DIAGNOSIS — M25631 Stiffness of right wrist, not elsewhere classified: Secondary | ICD-10-CM | POA: Diagnosis not present

## 2019-06-30 DIAGNOSIS — M25631 Stiffness of right wrist, not elsewhere classified: Secondary | ICD-10-CM | POA: Diagnosis not present

## 2019-06-30 DIAGNOSIS — M6281 Muscle weakness (generalized): Secondary | ICD-10-CM | POA: Diagnosis not present

## 2019-06-30 DIAGNOSIS — M79631 Pain in right forearm: Secondary | ICD-10-CM | POA: Diagnosis not present

## 2019-06-30 DIAGNOSIS — S66329D Laceration of extensor muscle, fascia and tendon of unspecified finger at wrist and hand level, subsequent encounter: Secondary | ICD-10-CM | POA: Diagnosis not present

## 2019-07-04 DIAGNOSIS — M25631 Stiffness of right wrist, not elsewhere classified: Secondary | ICD-10-CM | POA: Diagnosis not present

## 2019-07-04 DIAGNOSIS — M79631 Pain in right forearm: Secondary | ICD-10-CM | POA: Diagnosis not present

## 2019-07-04 DIAGNOSIS — M6281 Muscle weakness (generalized): Secondary | ICD-10-CM | POA: Diagnosis not present

## 2019-07-04 DIAGNOSIS — S66329D Laceration of extensor muscle, fascia and tendon of unspecified finger at wrist and hand level, subsequent encounter: Secondary | ICD-10-CM | POA: Diagnosis not present

## 2019-07-06 DIAGNOSIS — M6281 Muscle weakness (generalized): Secondary | ICD-10-CM | POA: Diagnosis not present

## 2019-07-06 DIAGNOSIS — M25631 Stiffness of right wrist, not elsewhere classified: Secondary | ICD-10-CM | POA: Diagnosis not present

## 2019-07-06 DIAGNOSIS — M79631 Pain in right forearm: Secondary | ICD-10-CM | POA: Diagnosis not present

## 2019-07-06 DIAGNOSIS — S66329D Laceration of extensor muscle, fascia and tendon of unspecified finger at wrist and hand level, subsequent encounter: Secondary | ICD-10-CM | POA: Diagnosis not present

## 2019-07-14 DIAGNOSIS — S66329D Laceration of extensor muscle, fascia and tendon of unspecified finger at wrist and hand level, subsequent encounter: Secondary | ICD-10-CM | POA: Diagnosis not present

## 2019-07-14 DIAGNOSIS — M79631 Pain in right forearm: Secondary | ICD-10-CM | POA: Diagnosis not present

## 2019-07-14 DIAGNOSIS — M6281 Muscle weakness (generalized): Secondary | ICD-10-CM | POA: Diagnosis not present

## 2019-07-14 DIAGNOSIS — M25631 Stiffness of right wrist, not elsewhere classified: Secondary | ICD-10-CM | POA: Diagnosis not present

## 2019-07-18 DIAGNOSIS — M25631 Stiffness of right wrist, not elsewhere classified: Secondary | ICD-10-CM | POA: Diagnosis not present

## 2019-07-18 DIAGNOSIS — S66329D Laceration of extensor muscle, fascia and tendon of unspecified finger at wrist and hand level, subsequent encounter: Secondary | ICD-10-CM | POA: Diagnosis not present

## 2019-07-18 DIAGNOSIS — M79631 Pain in right forearm: Secondary | ICD-10-CM | POA: Diagnosis not present

## 2019-07-18 DIAGNOSIS — M6281 Muscle weakness (generalized): Secondary | ICD-10-CM | POA: Diagnosis not present

## 2019-07-26 DIAGNOSIS — M79631 Pain in right forearm: Secondary | ICD-10-CM | POA: Diagnosis not present

## 2019-07-26 DIAGNOSIS — M25631 Stiffness of right wrist, not elsewhere classified: Secondary | ICD-10-CM | POA: Diagnosis not present

## 2019-07-26 DIAGNOSIS — S66329D Laceration of extensor muscle, fascia and tendon of unspecified finger at wrist and hand level, subsequent encounter: Secondary | ICD-10-CM | POA: Diagnosis not present

## 2019-07-26 DIAGNOSIS — M6281 Muscle weakness (generalized): Secondary | ICD-10-CM | POA: Diagnosis not present

## 2019-07-28 DIAGNOSIS — S66329D Laceration of extensor muscle, fascia and tendon of unspecified finger at wrist and hand level, subsequent encounter: Secondary | ICD-10-CM | POA: Diagnosis not present

## 2019-07-28 DIAGNOSIS — M6281 Muscle weakness (generalized): Secondary | ICD-10-CM | POA: Diagnosis not present

## 2019-07-28 DIAGNOSIS — M25631 Stiffness of right wrist, not elsewhere classified: Secondary | ICD-10-CM | POA: Diagnosis not present

## 2019-07-28 DIAGNOSIS — M79631 Pain in right forearm: Secondary | ICD-10-CM | POA: Diagnosis not present

## 2019-08-01 DIAGNOSIS — M79631 Pain in right forearm: Secondary | ICD-10-CM | POA: Diagnosis not present

## 2019-08-01 DIAGNOSIS — M6281 Muscle weakness (generalized): Secondary | ICD-10-CM | POA: Diagnosis not present

## 2019-08-01 DIAGNOSIS — S66329D Laceration of extensor muscle, fascia and tendon of unspecified finger at wrist and hand level, subsequent encounter: Secondary | ICD-10-CM | POA: Diagnosis not present

## 2019-08-01 DIAGNOSIS — M25631 Stiffness of right wrist, not elsewhere classified: Secondary | ICD-10-CM | POA: Diagnosis not present

## 2019-08-02 ENCOUNTER — Encounter: Payer: Self-pay | Admitting: Gastroenterology

## 2019-08-02 ENCOUNTER — Ambulatory Visit (INDEPENDENT_AMBULATORY_CARE_PROVIDER_SITE_OTHER): Payer: 59

## 2019-08-02 ENCOUNTER — Ambulatory Visit (INDEPENDENT_AMBULATORY_CARE_PROVIDER_SITE_OTHER): Payer: 59 | Admitting: Gastroenterology

## 2019-08-02 ENCOUNTER — Other Ambulatory Visit: Payer: Self-pay

## 2019-08-02 VITALS — BP 138/72 | HR 89 | Temp 98.3°F | Ht 69.0 in | Wt 192.0 lb

## 2019-08-02 DIAGNOSIS — Z1159 Encounter for screening for other viral diseases: Secondary | ICD-10-CM

## 2019-08-02 DIAGNOSIS — R1310 Dysphagia, unspecified: Secondary | ICD-10-CM

## 2019-08-02 DIAGNOSIS — K219 Gastro-esophageal reflux disease without esophagitis: Secondary | ICD-10-CM | POA: Diagnosis not present

## 2019-08-02 MED ORDER — FAMOTIDINE 20 MG PO TABS: 20.0000 mg | ORAL_TABLET | Freq: Every day | ORAL | 3 refills | Status: DC

## 2019-08-02 MED ORDER — ESOMEPRAZOLE MAGNESIUM 40 MG PO CPDR: DELAYED_RELEASE_CAPSULE | ORAL | 3 refills | Status: DC

## 2019-08-02 NOTE — Patient Instructions (Signed)
You have been scheduled for an endoscopy. Please follow written instructions given to you at your visit today. If you use inhalers (even only as needed), please bring them with you on the day of your procedure.   We will send Nexium and Pepcid to your pharmacy   Gastroesophageal Reflux Disease, Adult Gastroesophageal reflux (GER) happens when acid from the stomach flows up into the tube that connects the mouth and the stomach (esophagus). Normally, food travels down the esophagus and stays in the stomach to be digested. However, when a person has GER, food and stomach acid sometimes move back up into the esophagus. If this becomes a more serious problem, the person may be diagnosed with a disease called gastroesophageal reflux disease (GERD). GERD occurs when the reflux:  Happens often.  Causes frequent or severe symptoms.  Causes problems such as damage to the esophagus. When stomach acid comes in contact with the esophagus, the acid may cause soreness (inflammation) in the esophagus. Over time, GERD may create small holes (ulcers) in the lining of the esophagus. What are the causes? This condition is caused by a problem with the muscle between the esophagus and the stomach (lower esophageal sphincter, or LES). Normally, the LES muscle closes after food passes through the esophagus to the stomach. When the LES is weakened or abnormal, it does not close properly, and that allows food and stomach acid to go back up into the esophagus. The LES can be weakened by certain dietary substances, medicines, and medical conditions, including:  Tobacco use.  Pregnancy.  Having a hiatal hernia.  Alcohol use.  Certain foods and beverages, such as coffee, chocolate, onions, and peppermint. What increases the risk? You are more likely to develop this condition if you:  Have an increased body weight.  Have a connective tissue disorder.  Use NSAID medicines. What are the signs or symptoms?  Symptoms of this condition include:  Heartburn.  Difficult or painful swallowing.  The feeling of having a lump in the throat.  Abitter taste in the mouth.  Bad breath.  Having a large amount of saliva.  Having an upset or bloated stomach.  Belching.  Chest pain. Different conditions can cause chest pain. Make sure you see your health care provider if you experience chest pain.  Shortness of breath or wheezing.  Ongoing (chronic) cough or a night-time cough.  Wearing away of tooth enamel.  Weight loss. How is this diagnosed? Your health care provider will take a medical history and perform a physical exam. To determine if you have mild or severe GERD, your health care provider may also monitor how you respond to treatment. You may also have tests, including:  A test to examine your stomach and esophagus with a small camera (endoscopy).  A test thatmeasures the acidity level in your esophagus.  A test thatmeasures how much pressure is on your esophagus.  A barium swallow or modified barium swallow test to show the shape, size, and functioning of your esophagus. How is this treated? The goal of treatment is to help relieve your symptoms and to prevent complications. Treatment for this condition may vary depending on how severe your symptoms are. Your health care provider may recommend:  Changes to your diet.  Medicine.  Surgery. Follow these instructions at home: Eating and drinking   Follow a diet as recommended by your health care provider. This may involve avoiding foods and drinks such as: ? Coffee and tea (with or without caffeine). ? Drinks that  containalcohol. ? Energy drinks and sports drinks. ? Carbonated drinks or sodas. ? Chocolate and cocoa. ? Peppermint and mint flavorings. ? Garlic and onions. ? Horseradish. ? Spicy and acidic foods, including peppers, chili powder, curry powder, vinegar, hot sauces, and barbecue sauce. ? Citrus fruit juices  and citrus fruits, such as oranges, lemons, and limes. ? Tomato-based foods, such as red sauce, chili, salsa, and pizza with red sauce. ? Fried and fatty foods, such as donuts, french fries, potato chips, and high-fat dressings. ? High-fat meats, such as hot dogs and fatty cuts of red and white meats, such as rib eye steak, sausage, ham, and bacon. ? High-fat dairy items, such as whole milk, butter, and cream cheese.  Eat small, frequent meals instead of large meals.  Avoid drinking large amounts of liquid with your meals.  Avoid eating meals during the 2-3 hours before bedtime.  Avoid lying down right after you eat.  Do not exercise right after you eat. Lifestyle   Do not use any products that contain nicotine or tobacco, such as cigarettes, e-cigarettes, and chewing tobacco. If you need help quitting, ask your health care provider.  Try to reduce your stress by using methods such as yoga or meditation. If you need help reducing stress, ask your health care provider.  If you are overweight, reduce your weight to an amount that is healthy for you. Ask your health care provider for guidance about a safe weight loss goal. General instructions  Pay attention to any changes in your symptoms.  Take over-the-counter and prescription medicines only as told by your health care provider. Do not take aspirin, ibuprofen, or other NSAIDs unless your health care provider told you to do so.  Wear loose-fitting clothing. Do not wear anything tight around your waist that causes pressure on your abdomen.  Raise (elevate) the head of your bed about 6 inches (15 cm).  Avoid bending over if this makes your symptoms worse.  Keep all follow-up visits as told by your health care provider. This is important. Contact a health care provider if:  You have: ? New symptoms. ? Unexplained weight loss. ? Difficulty swallowing or it hurts to swallow. ? Wheezing or a persistent cough. ? A hoarse voice.   Your symptoms do not improve with treatment. Get help right away if you:  Have pain in your arms, neck, jaw, teeth, or back.  Feel sweaty, dizzy, or light-headed.  Have chest pain or shortness of breath.  Vomit and your vomit looks like blood or coffee grounds.  Faint.  Have stool that is bloody or black.  Cannot swallow, drink, or eat. Summary  Gastroesophageal reflux happens when acid from the stomach flows up into the esophagus. GERD is a disease in which the reflux happens often, causes frequent or severe symptoms, or causes problems such as damage to the esophagus.  Treatment for this condition may vary depending on how severe your symptoms are. Your health care provider may recommend diet and lifestyle changes, medicine, or surgery.  Contact a health care provider if you have new or worsening symptoms.  Take over-the-counter and prescription medicines only as told by your health care provider. Do not take aspirin, ibuprofen, or other NSAIDs unless your health care provider told you to do so.  Keep all follow-up visits as told by your health care provider. This is important. This information is not intended to replace advice given to you by your health care provider. Make sure you discuss any questions  you have with your health care provider. Document Released: 06/17/2005 Document Revised: 03/16/2018 Document Reviewed: 03/16/2018 Elsevier Patient Education  2020 [employer].

## 2019-08-02 NOTE — Progress Notes (Signed)
Brian Kaiser    [id number]    1956-05-16  Primary Care Physician:Ehinger, Herbie [place], MD  Referring Physician: [name], MD [address] [place],  [place] [address]   Chief complaint:  Dysphagia  HPI:  63 yr M with complaints of solid and liquid dysphagia.  He started having intermittent hoarseness of voice, metallic taste in the mouth and difficulty swallowing in the past 1 to 2 years.  He feels something hung up in the back of her throat.  Occasional heartburn but denies any pain or burning sensation on swallowing.  No decreased appetite or weight loss. Denies any abdominal pain, nausea, vomiting, melena or blood per rectum.  He is currently not on any acid suppressive therapy.  Denies taking any over-the-counter Tums.  No history of excessive use of NSAIDs or caffeinated drinks.  No history of smoking.  He drinks wine occasionally.  No family history of GI malignancy or IBD.  Colonoscopy December 27, 2017: diminutive polyps [hyperplastic], diverticulosis and internal hemorrhoids  EGD September 26, 2014: Normal esophagus with regular Z-line, empirically dilated with [place].  GE junction biopsies consistent with reflux esophagitis.  Outpatient Encounter Medications as of 08/02/2019  Medication Sig  . cephALEXin (KEFLEX) 500 MG capsule Take 1 capsule (500 mg total) by mouth 3 (three) times daily.  Marland Kitchen HYDROcodone-acetaminophen (NORCO) 5-325 MG tablet Take 1 tablet by mouth every 6 (six) hours as needed for severe pain.  . Multiple Vitamin (MULTIVITAMIN) tablet Take 1 tablet by mouth daily.  . Omega-3 Fatty Acids (FISH OIL PO) Take by mouth.   No facility-administered encounter medications on file as of 08/02/2019.     Allergies as of 08/02/2019 - Review Complete 04/19/2019  Allergen Reaction Noted  . Augmentin [amoxicillin-pot clavulanate] Rash 12/27/2017    Past Medical History:  Diagnosis Date  . Forearm laceration    right forearm  .  GERD (gastroesophageal reflux disease)   . Hepatitis B   . Hyperlipidemia   . Pulmonary lesion    Left pulmonary schwannoma  . Schwannoma    left lower lung    Past Surgical History:  Procedure Laterality Date  . LIPOMA EXCISION Left    neck  . LUNG SURGERY Left   . OTHER SURGICAL HISTORY Left    pulmonary schwannoma excision  . WOUND EXPLORATION Right 04/19/2019   Procedure: WOUND EXPLORATION; REPAIR RIGHT FOREARM LACERATION;  Surgeon: Charlotte Crumb, MD;  Location: [facility];  Service: Orthopedics;  Laterality: Right;  AXILLARY BLOCK AND MAC    Family History  Problem Relation Age of Onset  . Macular degeneration Mother   . Dementia Mother   . Diverticulitis Mother   . Prostate cancer Paternal Grandfather   . Diverticulitis Brother   . Colon cancer Neg Hx   . Colon polyps Neg Hx   . Esophageal cancer Neg Hx   . Liver cancer Neg Hx   . Pancreatic cancer Neg Hx   . Rectal cancer Neg Hx   . Stomach cancer Neg Hx     Social History   Socioeconomic History  . Marital status: Married    Spouse name: Not on file  . Number of children: Not on file  . Years of education: Not on file  . Highest education level: Not on file  Occupational History  . Not on file  Social Needs  . Financial resource strain: Not on file  . Food insecurity  Worry: Not on file    Inability: Not on file  . Transportation needs    Medical: Not on file    Non-medical: Not on file  Tobacco Use  . Smoking status: Never Smoker  . Smokeless tobacco: Never Used  Substance and Sexual Activity  . Alcohol use: No    Alcohol/week: 0.0 standard drinks    Frequency: Never  . Drug use: No  . Sexual activity: Not on file  Lifestyle  . Physical activity    Days per week: Not on file    Minutes per session: Not on file  . Stress: Not on file  Relationships  . Social Herbalist on phone: Not on file    Gets together: Not on file    Attends religious service: Not on  file    Active member of club or organization: Not on file    Attends meetings of clubs or organizations: Not on file    Relationship status: Not on file  . Intimate partner violence    Fear of current or ex partner: Not on file    Emotionally abused: Not on file    Physically abused: Not on file    Forced sexual activity: Not on file  Other Topics Concern  . Not on file  Social History Narrative  . Not on file      Review of systems: Review of Systems  Constitutional: Negative for fever and chills.  HENT: Negative.   Eyes: Negative for blurred vision.  Respiratory: Negative for cough, shortness of breath and wheezing.   Cardiovascular: Negative for chest pain and palpitations.  Gastrointestinal: as per HPI Genitourinary: Negative for dysuria, urgency, frequency and hematuria.  Musculoskeletal: Negative for myalgias, back pain and joint pain.  Skin: Negative for itching and rash.  Neurological: Negative for dizziness, tremors, focal weakness, seizures and loss of consciousness.  Endo/Heme/Allergies: Positive for seasonal allergies.  Psychiatric/Behavioral: Negative for depression, suicidal ideas and hallucinations.  All other systems reviewed and are negative.   Physical Exam: Vitals:   08/02/19 0950  BP: 138/72  Pulse: 89  Temp: 98.3 F (36.8 C)   Body mass index is 28.35 kg/m. Gen:      No acute distress HEENT:  EOMI, sclera anicteric Neck:     No masses; no thyromegaly Lungs:    Clear to auscultation bilaterally; normal respiratory effort CV:         Regular rate and rhythm; no murmurs Abd:      + bowel sounds; soft, non-tender; no palpable masses, no distension Ext:    No edema; adequate peripheral perfusion Skin:      Warm and dry; no rash Neuro: alert and oriented x 3 Psych: normal mood and affect  Data Reviewed:  Reviewed labs, radiology imaging, old records and pertinent past GI work up   Assessment and Plan/Recommendations:  63 year old male with  complaints of hoarseness of voice, metallic acid taste in mouth and dysphagia to both solids and liquids Schedule for EGD to exclude malignancy, neoplastic lesion or peptic stricture The risks and benefits as well as alternatives of endoscopic procedure(s) have been discussed and reviewed. All questions answered. The patient agrees to proceed.  GERD: Start Nexium 40 mg daily 30 minutes before breakfast Pepcid 20 mg at bedtime as needed for nocturnal GERD symptoms Discussed antireflux measures and lifestyle modifications in detail  Due for recall colonoscopy April 2029  Return in 1 year or sooner if needed  K. Denzil Magnuson ,  MD    CC: [name], MD

## 2019-08-03 LAB — SARS CORONAVIRUS 2 (TAT 6-24 HRS): SARS Coronavirus 2: NEGATIVE

## 2019-08-04 ENCOUNTER — Ambulatory Visit (AMBULATORY_SURGERY_CENTER): Payer: 59 | Admitting: Gastroenterology

## 2019-08-04 ENCOUNTER — Other Ambulatory Visit: Payer: Self-pay

## 2019-08-04 ENCOUNTER — Encounter: Payer: Self-pay | Admitting: Gastroenterology

## 2019-08-04 VITALS — BP 130/81 | HR 61 | Temp 98.0°F | Resp 19 | Ht 69.0 in | Wt 192.0 lb

## 2019-08-04 DIAGNOSIS — R1310 Dysphagia, unspecified: Secondary | ICD-10-CM | POA: Diagnosis not present

## 2019-08-04 DIAGNOSIS — K222 Esophageal obstruction: Secondary | ICD-10-CM | POA: Diagnosis not present

## 2019-08-04 DIAGNOSIS — K449 Diaphragmatic hernia without obstruction or gangrene: Secondary | ICD-10-CM | POA: Diagnosis not present

## 2019-08-04 DIAGNOSIS — K2950 Unspecified chronic gastritis without bleeding: Secondary | ICD-10-CM | POA: Diagnosis not present

## 2019-08-04 MED ORDER — SODIUM CHLORIDE 0.9 % IV SOLN: 500.0000 mL | Freq: Once | INTRAVENOUS | Status: DC

## 2019-08-04 MED ORDER — ESOMEPRAZOLE MAGNESIUM 40 MG PO PACK: 40.0000 mg | PACK | Freq: Every day | ORAL | 12 refills | Status: DC

## 2019-08-04 MED ORDER — FAMOTIDINE 20 MG PO TABS: 20.0000 mg | ORAL_TABLET | Freq: Every evening | ORAL | 90 refills | Status: DC | PRN

## 2019-08-04 NOTE — Op Note (Signed)
[facility] Patient Name: Brian Kaiser Procedure Date: 08/04/2019 2:28 PM MRN: [id number] Endoscopist: Mauri Pole , MD Age: 63 Referring MD:  Date of Birth: 10/25/1955 Gender: Male Account #: [id number] Procedure:                Upper GI endoscopy Indications:              Dysphagia Medicines:                Monitored Anesthesia Care Procedure:                Pre-Anesthesia Assessment:                           - Prior to the procedure, a History and Physical                            was performed, and patient medications and                            allergies were reviewed. The patient's tolerance of                            previous anesthesia was also reviewed. The risks                            and benefits of the procedure and the sedation                            options and risks were discussed with the patient.                            All questions were answered, and informed consent                            was obtained. Prior Anticoagulants: The patient has                            taken no previous anticoagulant or antiplatelet                            agents. ASA Grade Assessment: II - A patient with                            mild systemic disease. After reviewing the risks                            and benefits, the patient was deemed in                            satisfactory condition to undergo the procedure.                           After obtaining informed consent, the endoscope was  passed under direct vision. Throughout the                            procedure, the patient's blood pressure, pulse, and                            oxygen saturations were monitored continuously. The                            Endoscope was introduced through the mouth, and                            advanced to the second part of duodenum. The upper                            GI endoscopy was accomplished without  difficulty.                            The patient tolerated the procedure well. Scope In: Scope Out: Findings:                 LA Grade C (one or more mucosal breaks continuous                            between tops of 2 or more mucosal folds, less than                            75% circumference) esophagitis with no bleeding was                            found 34 to 35 cm from the incisors.                           One benign-appearing, intrinsic mild                            (non-circumferential scarring) stenosis was found                            34 to 35 cm from the incisors. This stenosis                            measured greater than or equal to 3 cm (inner                            diameter) x less than one cm (in length). The                            stenosis was traversed.                           A small hiatal hernia was present.  Patchy mildly erythematous mucosa without bleeding                            was found in the prepyloric region of the stomach.                            Biopsies were taken with a cold forceps for                            Helicobacter pylori testing.                           Patchy mildly erythematous mucosa was found in the                            duodenal bulb. Complications:            No immediate complications. Estimated Blood Loss:     Estimated blood loss was minimal. Impression:               - LA Grade C reflux esophagitis with no bleeding.                           - Benign-appearing esophageal stenosis.                           - Small hiatal hernia.                           - Erythematous mucosa in the prepyloric region of                            the stomach. Biopsied.                           - Erythematous duodenopathy. Recommendation:           - Patient has a contact number available for                            emergencies. The signs and symptoms of potential                             delayed complications were discussed with the                            patient. Return to normal activities tomorrow.                            Written discharge instructions were provided to the                            patient.                           - Resume previous diet.                           -  Continue present medications.                           - Await pathology results.                           - Return to GI office in 2 months. Mauri Pole, MD 08/04/2019 2:53:19 PM This report has been signed electronically.

## 2019-08-04 NOTE — Progress Notes (Signed)
Pt tolerated well. VSS. Awake and to recovery. 

## 2019-08-04 NOTE — Patient Instructions (Signed)
Handouts given for gastritis and hiatal hernia.  YOU HAD AN ENDOSCOPIC PROCEDURE TODAY AT [facility]:   Refer to the procedure report that was given to you for any specific questions about what was found during the examination.  If the procedure report does not answer your questions, please call your gastroenterologist to clarify.  If you requested that your care partner not be given the details of your procedure findings, then the procedure report has been included in a sealed envelope for you to review at your convenience later.  YOU SHOULD EXPECT: Some feelings of bloating in the abdomen. Passage of more gas than usual.  Walking can help get rid of the air that was put into your GI tract during the procedure and reduce the bloating. If you had a lower endoscopy (such as a colonoscopy or flexible sigmoidoscopy) you may notice spotting of blood in your stool or on the toilet paper. If you underwent a bowel prep for your procedure, you may not have a normal bowel movement for a few days.  Please Note:  You might notice some irritation and congestion in your nose or some drainage.  This is from the oxygen used during your procedure.  There is no need for concern and it should clear up in a day or so.  SYMPTOMS TO REPORT IMMEDIATELY:   Following upper endoscopy (EGD)  Vomiting of blood or coffee ground material  New chest pain or pain under the shoulder blades  Painful or persistently difficult swallowing  New shortness of breath  Fever of 100F or higher  Black, tarry-looking stools  For urgent or emergent issues, a gastroenterologist can be reached at any hour by calling [phone].   DIET:  We do recommend a small meal at first, but then you may proceed to your regular diet.  Drink plenty of fluids but you should avoid alcoholic beverages for 24 hours.  ACTIVITY:  You should plan to take it easy for the rest of today and you should NOT DRIVE or use heavy machinery until  tomorrow (because of the sedation medicines used during the test).    FOLLOW UP: Our staff will call the number listed on your records 48-72 hours following your procedure to check on you and address any questions or concerns that you may have regarding the information given to you following your procedure. If we do not reach you, we will leave a message.  We will attempt to reach you two times.  During this call, we will ask if you have developed any symptoms of COVID 19. If you develop any symptoms (ie: fever, flu-like symptoms, shortness of breath, cough etc.) before then, please call [phone].  If you test positive for Covid 19 in the 2 weeks post procedure, please call and report this information to [place].    If any biopsies were taken you will be contacted by phone or by letter within the next 1-3 weeks.  Please call us at [phone] if you have not heard about the biopsies in 3 weeks.    SIGNATURES/CONFIDENTIALITY: You and/or your care partner have signed paperwork which will be entered into your electronic medical record.  These signatures attest to the fact that that the information above on your After Visit Summary has been reviewed and is understood.  Full responsibility of the confidentiality of this discharge information lies with you and/or your care-partner.

## 2019-08-04 NOTE — Progress Notes (Signed)
Called to room to assist during endoscopic procedure.  Patient ID and intended procedure confirmed with present staff. Received instructions for my participation in the procedure from the performing physician.  

## 2019-08-04 NOTE — Progress Notes (Signed)
Temperature taken by L.C., VS taken by C.W. 

## 2019-08-08 ENCOUNTER — Telehealth: Payer: Self-pay | Admitting: *Deleted

## 2019-08-08 ENCOUNTER — Telehealth: Payer: Self-pay

## 2019-08-08 MED ORDER — OMEPRAZOLE 40 MG PO CPDR: 40.0000 mg | DELAYED_RELEASE_CAPSULE | Freq: Every day | ORAL | 3 refills | Status: DC

## 2019-08-08 NOTE — Telephone Encounter (Signed)
Left message on follow up call. 

## 2019-08-08 NOTE — Telephone Encounter (Signed)
Shirlean Mylar, can you please send Rx for Omeprazole 40mg  daily (90 tabs with 3 refills),nexium is not covered by insurance. Thank you

## 2019-08-08 NOTE — Telephone Encounter (Signed)
Omeprazole sent today

## 2019-08-08 NOTE — Telephone Encounter (Signed)
1. Have you developed a fever since your procedure? no  2.   Have you had an respiratory symptoms (SOB or cough) since your procedure? no  3.   Have you tested positive for COVID 19 since your procedure no  4.   Have you had any family members/close contacts diagnosed with the COVID 19 since your procedure?  no   If yes to any of these questions please route to Joylene John, RN and Alphonsa Gin, Therapist, sports.     Follow up Call-  Call back number 08/04/2019 12/27/2017  Post procedure Call Back phone  # [phone]  Permission to leave phone message Yes Yes  Some recent data might be hidden     Patient questions:  Do you have a fever, pain , or abdominal swelling? No. Pain Score  0 *  Have you tolerated food without any problems? Yes.    Have you been able to return to your normal activities? Yes.    Do you have any questions about your discharge instructions: Diet   No. Medications  Yes.  Pt still hasnt received his rx for Nexium. Rx was sent in to his pharmacy in [place] but he is having problems with his insurance according to his pharmacist.  Follow up visit  No.  Do you have questions or concerns about your Care? No.  Actions: * If pain score is 4 or above: No action needed, pain <4.

## 2019-08-15 ENCOUNTER — Encounter: Payer: Self-pay | Admitting: Gastroenterology

## 2019-09-29 ENCOUNTER — Encounter: Payer: Self-pay | Admitting: Gastroenterology

## 2019-09-29 ENCOUNTER — Ambulatory Visit (INDEPENDENT_AMBULATORY_CARE_PROVIDER_SITE_OTHER): Payer: 59 | Admitting: Gastroenterology

## 2019-09-29 ENCOUNTER — Other Ambulatory Visit: Payer: Self-pay

## 2019-09-29 VITALS — BP 128/70 | HR 70 | Temp 98.7°F | Ht 69.0 in | Wt 196.4 lb

## 2019-09-29 DIAGNOSIS — K219 Gastro-esophageal reflux disease without esophagitis: Secondary | ICD-10-CM | POA: Diagnosis not present

## 2019-09-29 MED ORDER — PANTOPRAZOLE SODIUM 20 MG PO TBEC: 20.0000 mg | DELAYED_RELEASE_TABLET | Freq: Every day | ORAL | 3 refills | Status: DC

## 2019-09-29 MED ORDER — FAMOTIDINE 20 MG PO TABS: 20.0000 mg | ORAL_TABLET | Freq: Every day | ORAL | 3 refills | Status: DC

## 2019-09-29 MED ORDER — FAMOTIDINE 20 MG PO TABS: 20.0000 mg | ORAL_TABLET | Freq: Every evening | ORAL | 90 refills | Status: DC | PRN

## 2019-09-29 NOTE — Progress Notes (Signed)
Brian Kaiser    [id number]    1956-01-15  Primary Care Physician:Ehinger, Herbie [place], MD  Referring Physician: [name], MD [address] [place],  [place] [address]   Chief complaint: GERD  HPI: 64 year old male here for follow-up visit after EGD He is taking Pepcid with improvement of GERD He did not tolerate Prilosec  EGD August 04, 2019 showed erosive esophagitis, hiatal hernia and gastritis  No longer has dysphagia.  He was taking ibuprofen excessively after arm injury for pain control and he has since stopped taking them  Overall he is doing well denies any nausea, vomiting, abdominal pain, melena or blood per rectum.   Colonoscopy December 27, 2017: diminutive polyps [hyperplastic], diverticulosis and internal hemorrhoids  EGD September 26, 2014: Normal esophagus with regular Z-line, empirically dilated with [place].  GE junction biopsies consistent with reflux esophagitis.  Outpatient Encounter Medications as of 09/29/2019  Medication Sig  . aspirin EC 81 MG tablet Take 81 mg by mouth daily.  . famotidine (PEPCID) 20 MG tablet Take 1 tablet (20 mg total) by mouth at bedtime as needed for heartburn or indigestion.  . Multiple Vitamin (MULTIVITAMIN) tablet Take 1 tablet by mouth daily.  . Omega-3 Fatty Acids (FISH OIL PO) Take by mouth.  . [DISCONTINUED] esomeprazole (NEXIUM) 40 MG packet Take 40 mg by mouth daily before breakfast. (Patient not taking: Reported on 09/29/2019)  . [DISCONTINUED] omeprazole (PRILOSEC) 40 MG capsule Take 1 capsule (40 mg total) by mouth daily. (Patient not taking: Reported on 09/29/2019)   No facility-administered encounter medications on file as of 09/29/2019.    Allergies as of 09/29/2019 - Review Complete 09/29/2019  Allergen Reaction Noted  . Augmentin [amoxicillin-pot clavulanate] Rash 12/27/2017    Past Medical History:  Diagnosis Date  . Allergy   . Forearm laceration    right forearm  . GERD  (gastroesophageal reflux disease)   . Hepatitis B   . Hyperlipidemia   . Pulmonary lesion    Left pulmonary schwannoma  . Schwannoma    left lower lung    Past Surgical History:  Procedure Laterality Date  . COLONOSCOPY    . LIGAMENT REPAIR Right 04/19/2019   Right forearm  . LIPOMA EXCISION Left    neck  . LUNG SURGERY Left   . OTHER SURGICAL HISTORY Left    pulmonary schwannoma excision  . UPPER GASTROINTESTINAL ENDOSCOPY    . WOUND EXPLORATION Right 04/19/2019   Procedure: WOUND EXPLORATION; REPAIR RIGHT FOREARM LACERATION;  Surgeon: Charlotte Crumb, MD;  Location: [facility];  Service: Orthopedics;  Laterality: Right;  AXILLARY BLOCK AND MAC    Family History  Problem Relation Age of Onset  . Macular degeneration Mother   . Dementia Mother   . Diverticulitis Mother   . Prostate cancer Paternal Grandfather   . Diverticulitis Brother   . Colon cancer Neg Hx   . Colon polyps Neg Hx   . Esophageal cancer Neg Hx   . Liver cancer Neg Hx   . Pancreatic cancer Neg Hx   . Rectal cancer Neg Hx   . Stomach cancer Neg Hx     Social History   Socioeconomic History  . Marital status: Married    Spouse name: Not on file  . Number of children: Not on file  . Years of education: Not on file  . Highest education level: Not on file  Occupational History  . Not  on file  Tobacco Use  . Smoking status: Never Smoker  . Smokeless tobacco: Never Used  Substance and Sexual Activity  . Alcohol use: No    Alcohol/week: 0.0 standard drinks  . Drug use: No  . Sexual activity: Not on file  Other Topics Concern  . Not on file  Social History Narrative  . Not on file   Social Determinants of Health   Financial Resource Strain:   . Difficulty of Paying Living Expenses: Not on file  Food Insecurity:   . Worried About Charity fundraiser in the Last Year: Not on file  . Ran Out of Food in the Last Year: Not on file  Transportation Needs:   . Lack of  Transportation (Medical): Not on file  . Lack of Transportation (Non-Medical): Not on file  Physical Activity:   . Days of Exercise per Week: Not on file  . Minutes of Exercise per Session: Not on file  Stress:   . Feeling of Stress : Not on file  Social Connections:   . Frequency of Communication with Friends and Family: Not on file  . Frequency of Social Gatherings with Friends and Family: Not on file  . Attends Religious Services: Not on file  . Active Member of Clubs or Organizations: Not on file  . Attends Archivist Meetings: Not on file  . Marital Status: Not on file  Intimate Partner Violence:   . Fear of Current or Ex-Partner: Not on file  . Emotionally Abused: Not on file  . Physically Abused: Not on file  . Sexually Abused: Not on file      Review of systems: Review of Systems  Constitutional: Negative for fever and chills.  HENT: Negative.   Eyes: Negative for blurred vision.  Respiratory: Negative for cough, shortness of breath and wheezing.   Cardiovascular: Negative for chest pain and palpitations.  Gastrointestinal: as per HPI Genitourinary: Negative for dysuria, urgency, frequency and hematuria.  Musculoskeletal: Negative for myalgias, back pain and joint pain.  Skin: Negative for itching and rash.  Neurological: Negative for dizziness, tremors, focal weakness, seizures and loss of consciousness.  Endo/Heme/Allergies: Negative Psychiatric/Behavioral: Negative for depression, suicidal ideas and hallucinations.  All other systems reviewed and are negative.   Physical Exam: Vitals:   09/29/19 1411  BP: 128/70  Pulse: 70  Temp: 98.7 F (37.1 C)  SpO2: 98%   Body mass index is 29 kg/m. Gen:      No acute distress HEENT:  EOMI, sclera anicteric Neck:     No masses; no thyromegaly Lungs:    Clear to auscultation bilaterally; normal respiratory effort CV:         Regular rate and rhythm; no murmurs Abd:      + bowel sounds; soft, non-tender;  no palpable masses, no distension Ext:    No edema; adequate peripheral perfusion Skin:      Warm and dry; no rash Neuro: alert and oriented x 3 Psych: normal mood and affect  Data Reviewed:  Reviewed labs, radiology imaging, old records and pertinent past GI work up   Assessment and Plan/Recommendations:  64 year old male with history of left pulmonary schwannoma s/p resection, GERD with erosive esophagitis in the setting of NSAID use  Protonix 20 mg daily, 30 minutes before breakfast Pepcid at bedtime as needed Antireflux measures and lifestyle modifications  Avoid or limit use of NSAIDs  Continue for recall colonoscopy 2029  Return as needed  This visit required 20 minutes  of patient care (this includes precharting, chart review, review of results, face-to-face time used for counseling as well as treatment plan and follow-up. The patient was provided an opportunity to ask questions and all were answered. The patient agreed with the plan and demonstrated an understanding of the instructions.  Damaris Hippo , MD    CC: [name], MD

## 2019-09-29 NOTE — Patient Instructions (Signed)
We have sent Protonix and Pepcid to your pharmacy   Gastroesophageal Reflux Disease, Adult Gastroesophageal reflux (GER) happens when acid from the stomach flows up into the tube that connects the mouth and the stomach (esophagus). Normally, food travels down the esophagus and stays in the stomach to be digested. However, when a person has GER, food and stomach acid sometimes move back up into the esophagus. If this becomes a more serious problem, the person may be diagnosed with a disease called gastroesophageal reflux disease (GERD). GERD occurs when the reflux:  Happens often.  Causes frequent or severe symptoms.  Causes problems such as damage to the esophagus. When stomach acid comes in contact with the esophagus, the acid may cause soreness (inflammation) in the esophagus. Over time, GERD may create small holes (ulcers) in the lining of the esophagus. What are the causes? This condition is caused by a problem with the muscle between the esophagus and the stomach (lower esophageal sphincter, or LES). Normally, the LES muscle closes after food passes through the esophagus to the stomach. When the LES is weakened or abnormal, it does not close properly, and that allows food and stomach acid to go back up into the esophagus. The LES can be weakened by certain dietary substances, medicines, and medical conditions, including:  Tobacco use.  Pregnancy.  Having a hiatal hernia.  Alcohol use.  Certain foods and beverages, such as coffee, chocolate, onions, and peppermint. What increases the risk? You are more likely to develop this condition if you:  Have an increased body weight.  Have a connective tissue disorder.  Use NSAID medicines. What are the signs or symptoms? Symptoms of this condition include:  Heartburn.  Difficult or painful swallowing.  The feeling of having a lump in the throat.  Abitter taste in the mouth.  Bad breath.  Having a large amount of saliva.   Having an upset or bloated stomach.  Belching.  Chest pain. Different conditions can cause chest pain. Make sure you see your health care provider if you experience chest pain.  Shortness of breath or wheezing.  Ongoing (chronic) cough or a night-time cough.  Wearing away of tooth enamel.  Weight loss. How is this diagnosed? Your health care provider will take a medical history and perform a physical exam. To determine if you have mild or severe GERD, your health care provider may also monitor how you respond to treatment. You may also have tests, including:  A test to examine your stomach and esophagus with a small camera (endoscopy).  A test thatmeasures the acidity level in your esophagus.  A test thatmeasures how much pressure is on your esophagus.  A barium swallow or modified barium swallow test to show the shape, size, and functioning of your esophagus. How is this treated? The goal of treatment is to help relieve your symptoms and to prevent complications. Treatment for this condition may vary depending on how severe your symptoms are. Your health care provider may recommend:  Changes to your diet.  Medicine.  Surgery. Follow these instructions at home: Eating and drinking   Follow a diet as recommended by your health care provider. This may involve avoiding foods and drinks such as: ? Coffee and tea (with or without caffeine). ? Drinks that containalcohol. ? Energy drinks and sports drinks. ? Carbonated drinks or sodas. ? Chocolate and cocoa. ? Peppermint and mint flavorings. ? Garlic and onions. ? Horseradish. ? Spicy and acidic foods, including peppers, chili powder, curry powder, vinegar,  hot sauces, and barbecue sauce. ? Citrus fruit juices and citrus fruits, such as oranges, lemons, and limes. ? Tomato-based foods, such as red sauce, chili, salsa, and pizza with red sauce. ? Fried and fatty foods, such as donuts, french fries, potato chips, and high-fat  dressings. ? High-fat meats, such as hot dogs and fatty cuts of red and white meats, such as rib eye steak, sausage, ham, and bacon. ? High-fat dairy items, such as whole milk, butter, and cream cheese.  Eat small, frequent meals instead of large meals.  Avoid drinking large amounts of liquid with your meals.  Avoid eating meals during the 2-3 hours before bedtime.  Avoid lying down right after you eat.  Do not exercise right after you eat. Lifestyle   Do not use any products that contain nicotine or tobacco, such as cigarettes, e-cigarettes, and chewing tobacco. If you need help quitting, ask your health care provider.  Try to reduce your stress by using methods such as yoga or meditation. If you need help reducing stress, ask your health care provider.  If you are overweight, reduce your weight to an amount that is healthy for you. Ask your health care provider for guidance about a safe weight loss goal. General instructions  Pay attention to any changes in your symptoms.  Take over-the-counter and prescription medicines only as told by your health care provider. Do not take aspirin, ibuprofen, or other NSAIDs unless your health care provider told you to do so.  Wear loose-fitting clothing. Do not wear anything tight around your waist that causes pressure on your abdomen.  Raise (elevate) the head of your bed about 6 inches (15 cm).  Avoid bending over if this makes your symptoms worse.  Keep all follow-up visits as told by your health care provider. This is important. Contact a health care provider if:  You have: ? New symptoms. ? Unexplained weight loss. ? Difficulty swallowing or it hurts to swallow. ? Wheezing or a persistent cough. ? A hoarse voice.  Your symptoms do not improve with treatment. Get help right away if you:  Have pain in your arms, neck, jaw, teeth, or back.  Feel sweaty, dizzy, or light-headed.  Have chest pain or shortness of breath.  Vomit  and your vomit looks like blood or coffee grounds.  Faint.  Have stool that is bloody or black.  Cannot swallow, drink, or eat. Summary  Gastroesophageal reflux happens when acid from the stomach flows up into the esophagus. GERD is a disease in which the reflux happens often, causes frequent or severe symptoms, or causes problems such as damage to the esophagus.  Treatment for this condition may vary depending on how severe your symptoms are. Your health care provider may recommend diet and lifestyle changes, medicine, or surgery.  Contact a health care provider if you have new or worsening symptoms.  Take over-the-counter and prescription medicines only as told by your health care provider. Do not take aspirin, ibuprofen, or other NSAIDs unless your health care provider told you to do so.  Keep all follow-up visits as told by your health care provider. This is important. This information is not intended to replace advice given to you by your health care provider. Make sure you discuss any questions you have with your health care provider. Document Revised: 03/16/2018 Document Reviewed: 03/16/2018 Elsevier Patient Education  [place].  I appreciate the  opportunity to care for you  Thank You   Harl Bowie , MD

## 2019-10-02 ENCOUNTER — Encounter: Payer: Self-pay | Admitting: Gastroenterology

## 2019-12-04 DIAGNOSIS — H6690 Otitis media, unspecified, unspecified ear: Secondary | ICD-10-CM | POA: Diagnosis not present

## 2019-12-04 DIAGNOSIS — H6121 Impacted cerumen, right ear: Secondary | ICD-10-CM | POA: Diagnosis not present

## 2020-02-28 DIAGNOSIS — L720 Epidermal cyst: Secondary | ICD-10-CM | POA: Diagnosis not present

## 2020-02-28 DIAGNOSIS — C44712 Basal cell carcinoma of skin of right lower limb, including hip: Secondary | ICD-10-CM | POA: Diagnosis not present

## 2020-02-28 DIAGNOSIS — L814 Other melanin hyperpigmentation: Secondary | ICD-10-CM | POA: Diagnosis not present

## 2020-02-28 DIAGNOSIS — D229 Melanocytic nevi, unspecified: Secondary | ICD-10-CM | POA: Diagnosis not present

## 2020-02-28 DIAGNOSIS — L570 Actinic keratosis: Secondary | ICD-10-CM | POA: Diagnosis not present

## 2020-02-28 DIAGNOSIS — D485 Neoplasm of uncertain behavior of skin: Secondary | ICD-10-CM | POA: Diagnosis not present

## 2020-02-28 DIAGNOSIS — L821 Other seborrheic keratosis: Secondary | ICD-10-CM | POA: Diagnosis not present

## 2020-03-13 DIAGNOSIS — C44712 Basal cell carcinoma of skin of right lower limb, including hip: Secondary | ICD-10-CM | POA: Diagnosis not present

## 2020-04-08 ENCOUNTER — Other Ambulatory Visit: Payer: Self-pay | Admitting: Gastroenterology

## 2020-05-20 DIAGNOSIS — Z85828 Personal history of other malignant neoplasm of skin: Secondary | ICD-10-CM | POA: Diagnosis not present

## 2020-05-20 DIAGNOSIS — D485 Neoplasm of uncertain behavior of skin: Secondary | ICD-10-CM | POA: Diagnosis not present

## 2020-05-20 DIAGNOSIS — C44519 Basal cell carcinoma of skin of other part of trunk: Secondary | ICD-10-CM | POA: Diagnosis not present

## 2020-05-20 DIAGNOSIS — L905 Scar conditions and fibrosis of skin: Secondary | ICD-10-CM | POA: Diagnosis not present

## 2020-06-03 DIAGNOSIS — K219 Gastro-esophageal reflux disease without esophagitis: Secondary | ICD-10-CM | POA: Diagnosis not present

## 2020-06-03 DIAGNOSIS — R591 Generalized enlarged lymph nodes: Secondary | ICD-10-CM | POA: Diagnosis not present

## 2020-06-03 DIAGNOSIS — E781 Pure hyperglyceridemia: Secondary | ICD-10-CM | POA: Diagnosis not present

## 2020-06-03 DIAGNOSIS — R634 Abnormal weight loss: Secondary | ICD-10-CM | POA: Diagnosis not present

## 2020-06-03 DIAGNOSIS — Z125 Encounter for screening for malignant neoplasm of prostate: Secondary | ICD-10-CM | POA: Diagnosis not present

## 2020-06-03 DIAGNOSIS — Z0000 Encounter for general adult medical examination without abnormal findings: Secondary | ICD-10-CM | POA: Diagnosis not present

## 2020-06-03 DIAGNOSIS — I10 Essential (primary) hypertension: Secondary | ICD-10-CM | POA: Diagnosis not present

## 2020-06-04 ENCOUNTER — Other Ambulatory Visit: Payer: Self-pay | Admitting: Family Medicine

## 2020-06-04 DIAGNOSIS — R61 Generalized hyperhidrosis: Secondary | ICD-10-CM

## 2020-06-04 DIAGNOSIS — R599 Enlarged lymph nodes, unspecified: Secondary | ICD-10-CM

## 2020-06-04 DIAGNOSIS — R634 Abnormal weight loss: Secondary | ICD-10-CM

## 2020-06-06 ENCOUNTER — Other Ambulatory Visit: Payer: Self-pay

## 2020-06-06 ENCOUNTER — Ambulatory Visit (INDEPENDENT_AMBULATORY_CARE_PROVIDER_SITE_OTHER): Payer: 59

## 2020-06-06 DIAGNOSIS — R599 Enlarged lymph nodes, unspecified: Secondary | ICD-10-CM

## 2020-06-06 DIAGNOSIS — R61 Generalized hyperhidrosis: Secondary | ICD-10-CM | POA: Diagnosis not present

## 2020-06-06 DIAGNOSIS — R634 Abnormal weight loss: Secondary | ICD-10-CM

## 2020-06-06 DIAGNOSIS — R591 Generalized enlarged lymph nodes: Secondary | ICD-10-CM

## 2020-06-06 DIAGNOSIS — R161 Splenomegaly, not elsewhere classified: Secondary | ICD-10-CM | POA: Diagnosis not present

## 2020-06-06 DIAGNOSIS — R188 Other ascites: Secondary | ICD-10-CM | POA: Diagnosis not present

## 2020-06-06 DIAGNOSIS — R590 Localized enlarged lymph nodes: Secondary | ICD-10-CM | POA: Diagnosis not present

## 2020-06-06 MED ORDER — IOHEXOL 300 MG/ML  SOLN
100.0000 mL | Freq: Once | INTRAMUSCULAR | Status: AC | PRN
  Administered 2020-06-06: 100 mL via INTRAVENOUS

## 2020-06-11 ENCOUNTER — Inpatient Hospital Stay: Payer: 59 | Attending: Hematology | Admitting: Hematology

## 2020-06-11 ENCOUNTER — Other Ambulatory Visit: Payer: Self-pay

## 2020-06-11 ENCOUNTER — Inpatient Hospital Stay: Payer: 59

## 2020-06-11 ENCOUNTER — Ambulatory Visit: Payer: 59 | Admitting: Hematology

## 2020-06-11 VITALS — BP 134/91 | HR 75 | Temp 97.7°F | Resp 18 | Ht 69.0 in | Wt 177.5 lb

## 2020-06-11 DIAGNOSIS — R7989 Other specified abnormal findings of blood chemistry: Secondary | ICD-10-CM | POA: Insufficient documentation

## 2020-06-11 DIAGNOSIS — R5383 Other fatigue: Secondary | ICD-10-CM | POA: Diagnosis not present

## 2020-06-11 DIAGNOSIS — R161 Splenomegaly, not elsewhere classified: Secondary | ICD-10-CM

## 2020-06-11 DIAGNOSIS — D479 Neoplasm of uncertain behavior of lymphoid, hematopoietic and related tissue, unspecified: Secondary | ICD-10-CM

## 2020-06-11 DIAGNOSIS — R591 Generalized enlarged lymph nodes: Secondary | ICD-10-CM | POA: Insufficient documentation

## 2020-06-11 DIAGNOSIS — Z8042 Family history of malignant neoplasm of prostate: Secondary | ICD-10-CM | POA: Diagnosis not present

## 2020-06-11 DIAGNOSIS — R634 Abnormal weight loss: Secondary | ICD-10-CM | POA: Insufficient documentation

## 2020-06-11 LAB — SEDIMENTATION RATE: Sed Rate: 0 mm/hr (ref 0–16)

## 2020-06-11 LAB — CBC WITH DIFFERENTIAL/PLATELET
Abs Immature Granulocytes: 0.01 10*3/uL (ref 0.00–0.07)
Basophils Absolute: 0 10*3/uL (ref 0.0–0.1)
Basophils Relative: 1 %
Eosinophils Absolute: 0 10*3/uL (ref 0.0–0.5)
Eosinophils Relative: 1 %
HCT: 38.2 % — ABNORMAL LOW (ref 39.0–52.0)
Hemoglobin: 13.1 g/dL (ref 13.0–17.0)
Immature Granulocytes: 0 %
Lymphocytes Relative: 42 %
Lymphs Abs: 2.4 10*3/uL (ref 0.7–4.0)
MCH: 30.3 pg (ref 26.0–34.0)
MCHC: 34.3 g/dL (ref 30.0–36.0)
MCV: 88.4 fL (ref 80.0–100.0)
Monocytes Absolute: 0.6 10*3/uL (ref 0.1–1.0)
Monocytes Relative: 11 %
Neutro Abs: 2.7 10*3/uL (ref 1.7–7.7)
Neutrophils Relative %: 45 %
Platelets: 125 10*3/uL — ABNORMAL LOW (ref 150–400)
RBC: 4.32 MIL/uL (ref 4.22–5.81)
RDW: 14 % (ref 11.5–15.5)
WBC: 5.9 10*3/uL (ref 4.0–10.5)
nRBC: 0 % (ref 0.0–0.2)

## 2020-06-11 LAB — CMP (CANCER CENTER ONLY)
ALT: 23 U/L (ref 0–44)
AST: 41 U/L (ref 15–41)
Albumin: 3.4 g/dL — ABNORMAL LOW (ref 3.5–5.0)
Alkaline Phosphatase: 73 U/L (ref 38–126)
Anion gap: 8 (ref 5–15)
BUN: 15 mg/dL (ref 8–23)
CO2: 25 mmol/L (ref 22–32)
Calcium: 9.1 mg/dL (ref 8.9–10.3)
Chloride: 107 mmol/L (ref 98–111)
Creatinine: 1.1 mg/dL (ref 0.61–1.24)
GFR, Est AFR Am: >60 mL/min (ref >60)
GFR, Estimated: >60 mL/min (ref >60)
Glucose, Bld: 82 mg/dL (ref 70–99)
Potassium: 4.1 mmol/L (ref 3.5–5.1)
Sodium: 140 mmol/L (ref 135–145)
Total Bilirubin: 0.7 mg/dL (ref 0.3–1.2)
Total Protein: 6.5 g/dL (ref 6.5–8.1)

## 2020-06-11 LAB — ABO/RH: ABO/RH(D): O POS

## 2020-06-11 LAB — HEPATITIS B SURFACE ANTIGEN: Hepatitis B Surface Ag: NONREACTIVE

## 2020-06-11 LAB — LACTATE DEHYDROGENASE: LDH: 490 U/L — ABNORMAL HIGH (ref 98–192)

## 2020-06-11 LAB — HEPATITIS B CORE ANTIBODY, TOTAL: Hep B Core Total Ab: REACTIVE — AB

## 2020-06-11 NOTE — Patient Instructions (Signed)
Thank you for choosing [facility] to provide your oncology and hematology care.   Should you have questions after your visit to the [facility] ([facility]), please contact this office at [phone] between 8:30 AM and 4:30 PM.  Voice mails left after 4:00 PM may not be returned until the following business day.  Calls received after 4:30 PM will be answered by an off-site Nurse Triage Line.    Prescription Refills:  Please have your pharmacy contact us directly for most prescription requests.  Contact the office directly for refills of narcotics (pain medications). Allow 48-72 hours for refills.  Appointments: Please contact the [facility] scheduling department [phone] for questions regarding [facility] appointment scheduling.  Contact the schedulers with any scheduling changes so that your appointment can be rescheduled in a timely manner.   Central Scheduling for [facility] [phone] - Call to schedule procedures such as PET scans, CT scans, MRI, Ultrasound, etc.  To afford each patient quality time with our providers, please arrive 30 minutes before your scheduled appointment time.  If you arrive late for your appointment, you may be asked to reschedule.  We strive to give you quality time with our providers, and arriving late affects you and other patients whose appointments are after yours. If you are a no show for multiple scheduled visits, you may be dismissed from the clinic at the providers discretion.     Resources: [facility] Social Workers [phone] for additional information on assistance programs or assistance connecting with community support programs   [place] DSS  [phone]: Information regarding food stamps, Medicaid, and utility assistance [place] [employer] [place] [phone]   [place] [employer]'s shared-ride transportation service for eligible riders who have a disability that prevents them from riding the fixed route bus.   Medicare  Rights Center 800-333-4114 Helps people with Medicare understand their rights and benefits, navigate the Medicare system, and secure the quality healthcare they deserve [employer] 800-227-2345 Assists patients locate various types of support and financial assistance Cancer Care: 1-800-813-HOPE (4673) Provides financial assistance, online support groups, medication/co-pay assistance.   Transportation Assistance for appointments at [facility]: Transportation Coordinator [phone]  Again, thank you for choosing [facility] for your care.       

## 2020-06-11 NOTE — Progress Notes (Signed)
HEMATOLOGY/ONCOLOGY CONSULTATION NOTE  Date of Service: 06/11/2020  Patient Care Team: [name], MD as PCP - General (Family Medicine)  CHIEF COMPLAINTS/PURPOSE OF CONSULTATION:  Concern for Lymphoma  HISTORY OF PRESENTING ILLNESS:   Brian Kaiser is a wonderful 64 y.o. male who has been referred to [place] by Dr. [name] for evaluation and management of concern for lymphoma. Pt is accompanied today by his wife, Brian Kaiser. The pt reports that he is doing well overall.    The pt reports that last November he had an Upper Endoscopy because he was having abdominal bloating. Pt was found to have gastritis and was given Protonix and Pepcid to use as needed. Pt has not used Protonix and Pepcid in two months. His abdominal bloating has been intermittent since November, but has been better recently.   Last July pt had surgery on his right forearm and was experiencing difficulty sleeping due to him continuously rolling on the arm. This lasted for some time. Pt had his second COVID19 vaccine in February and his second Shingles vaccine two weeks later. After this he began experiencing fatigue, which is still present. His fatigue is not progressive and he is able to complete his daily task. Pt has drenching night sweats that started two months ago. These do not occur every night. He has lost 15 lbs in the last few months without effort, but started back going to the gym in May. Pt has noticed that left inguinal lymph node has fluctuated in size since he was diagnosed with Hepatitis B nearly 20 years ago. Recently the lymph node has grown in size and become more firm. He has also noticed an increase in the size of a right cervical lymph node. Pt has had left testicular discomfort in the last few weeks. He reports b/l ankle swelling, calf cramping and upper left abdominal pain with certain sleeping positions.  His Hepatitis B was picked up after he was experiencing abdominal discomfort and was treated  with milk thistle. It has been monitored via labwork over the years. Pt had a Basal Cell lesion on his left thigh and back a few months ago. They have been removed.   Of note prior to the patient's visit today, pt has had CT C/A/P ([id number]) completed on 06/06/2020 with results revealing "Extensive adenopathy in the chest, abdomen and pelvis. Marked splenomegaly. Findings most concerning for lymphoma. Small amount of free fluid in the abdomen and pelvis."   Most recent lab results (06/03/2020) of CBC w/diff & CMP is as follows: all values are WNL except for PLT at 135K. 06/03/2020 TSH at 7.86 06/03/2020 PSA at 0.20 06/03/2020 Sed Rate at 1  On review of systems, pt reports night sweats, new lumps/bumps, ankle swelling, calf cramping, constipation, change in color of bowel movements, left testicular pain, fatigue, unexpected weight loss, improving abdominal bloating, early satiety and denies SOB, chest pain, fevers, chills, bloody/black stools, headaches, vision changes, bone pain, back pain, flank pain and any other symptoms.   On PMHx the pt reports GERD, Forearm laceration, Right Forearm Ligament Repair, Hepatitis B, Hyperlipidemia. On Family Hx the pt reports that his brother was diagnosed with DLBCL a year ago.  MEDICAL HISTORY:  Past Medical History:  Diagnosis Date  . Allergy   . Forearm laceration    right forearm  . GERD (gastroesophageal reflux disease)   . Hepatitis B   . Hyperlipidemia   . Pulmonary lesion    Left pulmonary schwannoma  . Schwannoma  left lower lung    SURGICAL HISTORY: Past Surgical History:  Procedure Laterality Date  . COLONOSCOPY    . LIGAMENT REPAIR Right 04/19/2019   Right forearm  . LIPOMA EXCISION Left    neck  . LUNG SURGERY Left   . OTHER SURGICAL HISTORY Left    pulmonary schwannoma excision  . UPPER GASTROINTESTINAL ENDOSCOPY    . WOUND EXPLORATION Right 04/19/2019   Procedure: WOUND EXPLORATION; REPAIR RIGHT FOREARM LACERATION;   Surgeon: Charlotte Crumb, MD;  Location: [facility];  Service: Orthopedics;  Laterality: Right;  AXILLARY BLOCK AND MAC    SOCIAL HISTORY: Social History   Socioeconomic History  . Marital status: Married    Spouse name: Not on file  . Number of children: Not on file  . Years of education: Not on file  . Highest education level: Not on file  Occupational History  . Not on file  Tobacco Use  . Smoking status: Never Smoker  . Smokeless tobacco: Never Used  Vaping Use  . Vaping Use: Never used  Substance and Sexual Activity  . Alcohol use: No    Alcohol/week: 0.0 standard drinks  . Drug use: No  . Sexual activity: Not on file  Other Topics Concern  . Not on file  Social History Narrative  . Not on file   Social Determinants of Health   Financial Resource Strain:   . Difficulty of Paying Living Expenses: Not on file  Food Insecurity:   . Worried About Charity fundraiser in the Last Year: Not on file  . Ran Out of Food in the Last Year: Not on file  Transportation Needs:   . Lack of Transportation (Medical): Not on file  . Lack of Transportation (Non-Medical): Not on file  Physical Activity:   . Days of Exercise per Week: Not on file  . Minutes of Exercise per Session: Not on file  Stress:   . Feeling of Stress : Not on file  Social Connections:   . Frequency of Communication with Friends and Family: Not on file  . Frequency of Social Gatherings with Friends and Family: Not on file  . Attends Religious Services: Not on file  . Active Member of Clubs or Organizations: Not on file  . Attends Archivist Meetings: Not on file  . Marital Status: Not on file  Intimate Partner Violence:   . Fear of Current or Ex-Partner: Not on file  . Emotionally Abused: Not on file  . Physically Abused: Not on file  . Sexually Abused: Not on file    FAMILY HISTORY: Family History  Problem Relation Age of Onset  . Macular degeneration Mother   . Dementia  Mother   . Diverticulitis Mother   . Prostate cancer Paternal Grandfather   . Diverticulitis Brother   . Colon cancer Neg Hx   . Colon polyps Neg Hx   . Esophageal cancer Neg Hx   . Liver cancer Neg Hx   . Pancreatic cancer Neg Hx   . Rectal cancer Neg Hx   . Stomach cancer Neg Hx     ALLERGIES:  is allergic to augmentin [amoxicillin-pot clavulanate].  MEDICATIONS:  Current Outpatient Medications  Medication Sig Dispense Refill  . aspirin EC 81 MG tablet Take 81 mg by mouth daily.    . Multiple Vitamin (MULTIVITAMIN) tablet Take 1 tablet by mouth daily.    . Omega-3 Fatty Acids (FISH OIL PO) Take by mouth.    . pantoprazole (  PROTONIX) 20 MG tablet TAKE 1 TABLET BY MOUTH ONCE A DAY 90 tablet 3  . famotidine (PEPCID) 20 MG tablet Take 1 tablet (20 mg total) by mouth at bedtime. (Patient not taking: Reported on 06/11/2020) 30 tablet 3   No current facility-administered medications for this visit.    REVIEW OF SYSTEMS:    10 Point review of Systems was done is negative except as noted above.  PHYSICAL EXAMINATION: ECOG PERFORMANCE STATUS: 1 - Symptomatic but completely ambulatory  . Vitals:   06/11/20 1445  BP: (!) 134/91  Pulse: 75  Resp: 18  Temp: 97.7 F (36.5 C)  SpO2: 100%   Filed Weights   06/11/20 1445  Weight: 177 lb 8 oz (80.5 kg)   .Body mass index is 26.21 kg/m.  GENERAL:alert, in no acute distress and comfortable SKIN: no acute rashes, no significant lesions EYES: conjunctiva are pink and non-injected, sclera anicteric OROPHARYNX: MMM, no exudates, no oropharyngeal erythema or ulceration NECK: supple, no JVD LYMPH: Borderline enlarged lymph nodes above the collar bone. Lymphadenopathy in the right axillary. Large lymph node in the inguinal region. LUNGS: clear to auscultation b/l with normal respiratory effort HEART: regular rate & rhythm ABDOMEN:  normoactive bowel sounds , non tender, not distended. Extremity: no pedal edema PSYCH: alert &  oriented x 3 with fluent speech NEURO: no focal motor/sensory deficits  LABORATORY DATA:  I have reviewed the data as listed  . CBC Latest Ref Rng & Units 06/11/2020  WBC 4.0 - 10.5 K/uL 5.9  Hemoglobin 13.0 - 17.0 g/dL 13.1  Hematocrit 39 - 52 % 38.2(L)  Platelets 150 - 400 K/uL 125(L)    . CMP Latest Ref Rng & Units 06/11/2020 11/05/2008  Glucose 70 - 99 mg/dL 82 102(H)  BUN 8 - 23 mg/dL 15 11  Creatinine 0.61 - 1.24 mg/dL 1.10 1.0  Sodium 135 - 145 mmol/L 140 141  Potassium 3.5 - 5.1 mmol/L 4.1 3.6  Chloride 98 - 111 mmol/L 107 104  CO2 22 - 32 mmol/L 25 28  Calcium 8.9 - 10.3 mg/dL 9.1 9.4  Total Protein 6.5 - 8.1 g/dL 6.5 7.2  Total Bilirubin 0.3 - 1.2 mg/dL 0.7 0.7  Alkaline Phos 38 - 126 U/L 73 62  AST 15 - 41 U/L 41 28  ALT 0 - 44 U/L 23 48     RADIOGRAPHIC STUDIES: I have personally reviewed the radiological images as listed and agreed with the findings in the report. CT CHEST ABDOMEN PELVIS W CONTRAST  Result Date: 06/07/2020 CLINICAL DATA:  Night sweats, weight loss, adenopathy. Swelling right groin region EXAM: CT CHEST, ABDOMEN, AND PELVIS WITH CONTRAST TECHNIQUE: Multidetector CT imaging of the chest, abdomen and pelvis was performed following the standard protocol during bolus administration of intravenous contrast. CONTRAST:  171m OMNIPAQUE IOHEXOL 300 MG/ML  SOLN COMPARISON:  05/06/2018 FINDINGS: CT CHEST FINDINGS Cardiovascular: Heart is normal size.  Aorta is normal caliber. Mediastinum/Nodes: Extensive bilateral axillary adenopathy measuring up to 13 mm on the left and 11 mm on the right (short axis diameters). Mediastinal adenopathy. AP window lymph node measures up to 14 mm in short axis diameter. Subcarinal lymph node measures up to 20 mm short axis diameter. Bilateral hilar adenopathy. Index left hilar lymph node measures up to 19 mm short axis diameter. Bilateral cardiophrenic adenopathy with index right cardiophrenic lymph node measuring up to 22 mm short  axis diameter. Trachea and esophagus are unremarkable. Thyroid unremarkable. Lungs/Pleura: Lungs are clear. No focal airspace opacities or  suspicious nodules. No effusions. Musculoskeletal: Chest wall soft tissues are unremarkable. No acute bony abnormality. CT ABDOMEN PELVIS FINDINGS Hepatobiliary: No focal hepatic abnormality. Gallbladder unremarkable. Pancreas: No focal abnormality or ductal dilatation. Spleen: Markedly enlarged spleen with a craniocaudal length of 26.5 cm. Adrenals/Urinary Tract: No adrenal abnormality. No focal renal abnormality. No stones or hydronephrosis. Urinary bladder is unremarkable. Stomach/Bowel: Stomach, large and small bowel grossly unremarkable. Vascular/Lymphatic: No aneurysm. Retroperitoneal and mesenteric adenopathy. Bulky right upper quadrant nodal mass with a short axis diameter measuring up to 4.8 cm in the porta hepatis. Bulky retroperitoneal adenopathy measuring up to 2.4 cm short axis diameter anterior to the aorta. Bilateral iliac adenopathy and inguinal adenopathy. Index left iliac lymph node measures up to 1.7 cm on image 114. Index right external iliac lymph node measures up to 1.2 cm short axis diameter on image 120. Reproductive: Central calcifications within the prostate. Other: Small amount of free fluid in the abdomen and pelvis. No free air. Musculoskeletal: No acute bony abnormality. IMPRESSION: Extensive adenopathy in the chest, abdomen and pelvis. Marked splenomegaly. Findings most concerning for lymphoma. Small amount of free fluid in the abdomen and pelvis. Electronically Signed   By: Rolm Baptise M.D.   On: 06/07/2020 04:30    ASSESSMENT & PLAN:   64 yo with   1) Extensive Lymphadenopathy in the chest/abd/pelvis concerning for lymphoma. PLAN: -Discussed patient's most recent labs from 06/03/2020, blood counts & chemistries are nml, TSH is elevated, PSA & Sed Rate are WNL.  -Discussed 06/06/2020 CT C/A/P ([id number]) which revealed "Extensive  adenopathy in the chest, abdomen and pelvis. Marked splenomegaly. Findings most concerning for lymphoma. Small amount of free fluid in the abdomen and pelvis."  -Advised pt that based on radiographic evidence and clinical symptoms it appears that he has a lymphoma. -Advised pt that the symptomology goes back several months, which suggests a slower growing lymphoma.   -Advised pt that lymphomas do not typically have a genetic driver but are caused by somatic mutations.  -Advised pt that are other conditions that mimic lymphomas such as sarcoidosis or lymphomatoid granulomatosis.  -Discussed completing a w/o including: additional labs, PET/CT scan, and LN Bx -Will repeat Hepatitis B testing to confirm continued remission. -Will get US guided LN Bx in 1-3 days -Will get PET/CT in 3-5 days -Will get labs today -Will see back in 10-15 days   FOLLOW UP: Labs today US guided biopsy pf rt axillary or left inguinal LN- urgently in 1-3 days PET/CT in 5 days RTC with Dr Irene Limbo in 10-12 days   All of the patients questions were answered with apparent satisfaction. The patient knows to call the clinic with any problems, questions or concerns.  I spent 40 mins counseling the patient face to face. The total time spent in the appointment was 60 minutes and more than 50% was on counseling and direct patient cares and co-ordinating workup    Sullivan Lone MD [place] AAHIVMS [facility] [place] Hematology/Oncology [facility]  (Office):       [phone] (Work cell):  [phone] (Fax):           [phone]  06/11/2020 5:04 PM  I, Yevette Edwards, am acting as a scribe for Dr. Sullivan Lone.   .I have reviewed the above documentation for accuracy and completeness, and I agree with the above. Brunetta Genera MD

## 2020-06-12 LAB — MULTIPLE MYELOMA PANEL, SERUM
Albumin SerPl Elph-Mcnc: 4 g/dL (ref 2.9–4.4)
Albumin/Glob SerPl: 1.7 (ref 0.7–1.7)
Alpha 1: 0.2 g/dL (ref 0.0–0.4)
Alpha2 Glob SerPl Elph-Mcnc: 0.4 g/dL (ref 0.4–1.0)
B-Globulin SerPl Elph-Mcnc: 0.9 g/dL (ref 0.7–1.3)
Gamma Glob SerPl Elph-Mcnc: 0.9 g/dL (ref 0.4–1.8)
Globulin, Total: 2.5 g/dL (ref 2.2–3.9)
IgA: 109 mg/dL (ref 61–437)
IgG (Immunoglobin G), Serum: 910 mg/dL (ref 603–1613)
IgM (Immunoglobulin M), Srm: 32 mg/dL (ref 20–172)
Total Protein ELP: 6.5 g/dL (ref 6.0–8.5)

## 2020-06-12 LAB — HEPATITIS B DNA, ULTRAQUANTITATIVE, PCR
HBV DNA SERPL PCR-ACNC: NOT DETECTED IU/mL
HBV DNA SERPL PCR-LOG IU: UNDETERMINED log10 IU/mL

## 2020-06-12 LAB — SURGICAL PATHOLOGY

## 2020-06-13 LAB — FLOW CYTOMETRY

## 2020-06-14 ENCOUNTER — Telehealth: Payer: Self-pay | Admitting: Hematology

## 2020-06-14 ENCOUNTER — Encounter (HOSPITAL_COMMUNITY): Payer: Self-pay

## 2020-06-14 NOTE — Progress Notes (Unsigned)
Corliss Parish "Joe" Male, 64 y.o., 24-Dec-1955 MRN:  [id number] Phone:  [phone] Jerilynn Mages) PCP:  [name], MD Coverage:  [employer] Next Appt With Radiology ([facility] PET) 06/19/2020 at 12:00 PM  RE: biopsy Received: Yesterday Message Details  Markus Daft, MD  Ernestene Mention for US guided right axilla LN biopsy.   [place]   Previous Messages  ----- Message -----  From: Lenore Cordia  Sent: 06/13/2020 10:22 AM EDT  To: [facility] Procedure Requests  Subject: biopsy                      Procedure Requested: US Biopsy    Reason for Procedure: Likely Non hodgkins lymphoma for urgent US guided core needle biopsy of left inguinal/iliac or rt axillary LN for lymphoma workup    Provider Requesting: Brunetta Genera  Provider Telephone:  [phone]

## 2020-06-14 NOTE — Telephone Encounter (Signed)
Scheduled per 09/22 los, patient has been called and notified. 

## 2020-06-17 ENCOUNTER — Ambulatory Visit (HOSPITAL_COMMUNITY): Payer: 59

## 2020-06-19 ENCOUNTER — Other Ambulatory Visit: Payer: Self-pay | Admitting: Radiology

## 2020-06-19 ENCOUNTER — Other Ambulatory Visit: Payer: Self-pay

## 2020-06-19 ENCOUNTER — Ambulatory Visit (HOSPITAL_COMMUNITY)
Admission: RE | Admit: 2020-06-19 | Discharge: 2020-06-19 | Disposition: A | Discharge status: Home / Self Care | Payer: 59 | Source: Ambulatory Visit | Attending: Hematology | Admitting: Hematology

## 2020-06-19 DIAGNOSIS — D479 Neoplasm of uncertain behavior of lymphoid, hematopoietic and related tissue, unspecified: Secondary | ICD-10-CM | POA: Diagnosis not present

## 2020-06-19 DIAGNOSIS — R161 Splenomegaly, not elsewhere classified: Secondary | ICD-10-CM | POA: Insufficient documentation

## 2020-06-19 DIAGNOSIS — K7689 Other specified diseases of liver: Secondary | ICD-10-CM | POA: Diagnosis not present

## 2020-06-19 DIAGNOSIS — C8590 Non-Hodgkin lymphoma, unspecified, unspecified site: Secondary | ICD-10-CM | POA: Diagnosis not present

## 2020-06-19 DIAGNOSIS — R590 Localized enlarged lymph nodes: Secondary | ICD-10-CM | POA: Diagnosis not present

## 2020-06-19 LAB — GLUCOSE, CAPILLARY: Glucose-Capillary: 76 mg/dL (ref 70–99)

## 2020-06-19 MED ORDER — FLUDEOXYGLUCOSE F - 18 (FDG) INJECTION
10.1000 | Freq: Once | INTRAVENOUS | Status: AC
  Administered 2020-06-19: 10.1 via INTRAVENOUS

## 2020-06-20 ENCOUNTER — Other Ambulatory Visit: Payer: Self-pay

## 2020-06-20 ENCOUNTER — Ambulatory Visit (HOSPITAL_COMMUNITY)
Admission: RE | Admit: 2020-06-20 | Discharge: 2020-06-20 | Disposition: A | Discharge status: Home / Self Care | Payer: 59 | Source: Ambulatory Visit | Attending: Hematology | Admitting: Hematology

## 2020-06-20 DIAGNOSIS — R591 Generalized enlarged lymph nodes: Secondary | ICD-10-CM | POA: Diagnosis not present

## 2020-06-20 DIAGNOSIS — D479 Neoplasm of uncertain behavior of lymphoid, hematopoietic and related tissue, unspecified: Secondary | ICD-10-CM | POA: Diagnosis not present

## 2020-06-20 DIAGNOSIS — R161 Splenomegaly, not elsewhere classified: Secondary | ICD-10-CM

## 2020-06-20 DIAGNOSIS — C8314 Mantle cell lymphoma, lymph nodes of axilla and upper limb: Secondary | ICD-10-CM | POA: Insufficient documentation

## 2020-06-20 DIAGNOSIS — R590 Localized enlarged lymph nodes: Secondary | ICD-10-CM | POA: Diagnosis not present

## 2020-06-20 LAB — CBC
HCT: 41.7 % (ref 39.0–52.0)
Hemoglobin: 13.3 g/dL (ref 13.0–17.0)
MCH: 28.7 pg (ref 26.0–34.0)
MCHC: 31.9 g/dL (ref 30.0–36.0)
MCV: 90.1 fL (ref 80.0–100.0)
Platelets: 132 10*3/uL — ABNORMAL LOW (ref 150–400)
RBC: 4.63 MIL/uL (ref 4.22–5.81)
RDW: 14.1 % (ref 11.5–15.5)
WBC: 5 10*3/uL (ref 4.0–10.5)
nRBC: 0 % (ref 0.0–0.2)

## 2020-06-20 LAB — PROTIME-INR
INR: 1.3 — ABNORMAL HIGH (ref 0.8–1.2)
Prothrombin Time: 15.7 seconds — ABNORMAL HIGH (ref 11.4–15.2)

## 2020-06-20 MED ORDER — LIDOCAINE HCL (PF) 1 % IJ SOLN
INTRAMUSCULAR | Status: AC
  Filled 2020-06-20: qty 30

## 2020-06-20 MED ORDER — SODIUM CHLORIDE 0.9 % IV SOLN: INTRAVENOUS | Status: DC

## 2020-06-20 NOTE — Procedures (Signed)
Interventional Radiology Procedure Note  Procedure: US guided biopsy of right axillary node.  Mx 33A core Complications: None EBL: None Recommendations:   - Routine wound care - Follow up pathology - Advance diet   Signed,  Corrie Mckusick, DO

## 2020-06-20 NOTE — Progress Notes (Addendum)
Patient presents to Radiology today for lymph node biopsy.   R submandibular and axillary lymph nodes palpable.   PET yesterday shows widespread lymphadenopathy.  Patient states preference to proceed with biopsy without sedation medication today.   Risks and benefits of lymph node biopsy was discussed with the patient and/or patient's family including, but not limited to bleeding, infection, damage to adjacent structures or low yield requiring additional tests.  All of the questions were answered and there is agreement to proceed.  Consent signed and in chart.   Brynda Greathouse, MS RD PA-C 12:20 PM

## 2020-06-21 ENCOUNTER — Telehealth: Payer: Self-pay

## 2020-06-21 NOTE — Telephone Encounter (Signed)
Received call from patient stating that Dr. Earleen Newport did not think his biopsy result would be back before his appt. with Dr. Irene Limbo on Monday 06/24/20 and so he would need to reschedule the appt.  Per Dr. Irene Limbo: can move appointment to 12noon @ 10/5-- should have the results by then. Thx.  Appt. rescheduled to Tuesday 06/25/20 at 12 noon patient is aware of this change and agreeable.

## 2020-06-24 ENCOUNTER — Ambulatory Visit: Payer: 59 | Admitting: Hematology

## 2020-06-24 LAB — SURGICAL PATHOLOGY

## 2020-06-25 ENCOUNTER — Other Ambulatory Visit: Payer: Self-pay | Admitting: Hematology

## 2020-06-25 ENCOUNTER — Inpatient Hospital Stay: Payer: 59 | Attending: Hematology | Admitting: Hematology

## 2020-06-25 ENCOUNTER — Other Ambulatory Visit: Payer: Self-pay

## 2020-06-25 VITALS — BP 146/102 | HR 92 | Temp 97.5°F | Resp 18 | Ht 69.0 in | Wt 173.6 lb

## 2020-06-25 DIAGNOSIS — Z5111 Encounter for antineoplastic chemotherapy: Secondary | ICD-10-CM | POA: Diagnosis not present

## 2020-06-25 DIAGNOSIS — D696 Thrombocytopenia, unspecified: Secondary | ICD-10-CM | POA: Diagnosis not present

## 2020-06-25 DIAGNOSIS — Z5112 Encounter for antineoplastic immunotherapy: Secondary | ICD-10-CM | POA: Insufficient documentation

## 2020-06-25 DIAGNOSIS — R768 Other specified abnormal immunological findings in serum: Secondary | ICD-10-CM

## 2020-06-25 DIAGNOSIS — C8314 Mantle cell lymphoma, lymph nodes of axilla and upper limb: Secondary | ICD-10-CM | POA: Insufficient documentation

## 2020-06-25 DIAGNOSIS — Z7189 Other specified counseling: Secondary | ICD-10-CM | POA: Diagnosis not present

## 2020-06-25 DIAGNOSIS — R634 Abnormal weight loss: Secondary | ICD-10-CM | POA: Diagnosis not present

## 2020-06-25 DIAGNOSIS — R7402 Elevation of levels of lactic acid dehydrogenase (LDH): Secondary | ICD-10-CM | POA: Insufficient documentation

## 2020-06-25 DIAGNOSIS — R1012 Left upper quadrant pain: Secondary | ICD-10-CM | POA: Diagnosis not present

## 2020-06-25 DIAGNOSIS — C8318 Mantle cell lymphoma, lymph nodes of multiple sites: Secondary | ICD-10-CM

## 2020-06-25 DIAGNOSIS — R21 Rash and other nonspecific skin eruption: Secondary | ICD-10-CM | POA: Diagnosis not present

## 2020-06-25 DIAGNOSIS — R161 Splenomegaly, not elsewhere classified: Secondary | ICD-10-CM | POA: Diagnosis not present

## 2020-06-25 MED ORDER — LORAZEPAM 0.5 MG PO TABS: 0.5000 mg | ORAL_TABLET | Freq: Three times a day (TID) | ORAL | 0 refills | Status: DC | PRN

## 2020-06-25 MED ORDER — ALLOPURINOL 100 MG PO TABS: 100.0000 mg | ORAL_TABLET | Freq: Two times a day (BID) | ORAL | 0 refills | Status: DC

## 2020-06-25 MED ORDER — PREDNISONE 20 MG PO TABS: 60.0000 mg | ORAL_TABLET | Freq: Every day | ORAL | 0 refills | Status: DC

## 2020-06-25 NOTE — Patient Instructions (Signed)
Thank you for choosing [facility] to provide your oncology and hematology care.   Should you have questions after your visit to the [facility] ([facility]), please contact this office at [phone] between 8:30 AM and 4:30 PM.  Voice mails left after 4:00 PM may not be returned until the following business day.  Calls received after 4:30 PM will be answered by an off-site Nurse Triage Line.    Prescription Refills:  Please have your pharmacy contact us directly for most prescription requests.  Contact the office directly for refills of narcotics (pain medications). Allow 48-72 hours for refills.  Appointments: Please contact the [facility] scheduling department [phone] for questions regarding [facility] appointment scheduling.  Contact the schedulers with any scheduling changes so that your appointment can be rescheduled in a timely manner.   Central Scheduling for [place] [phone] - Call to schedule procedures such as PET scans, CT scans, MRI, Ultrasound, etc.  To afford each patient quality time with our providers, please arrive 30 minutes before your scheduled appointment time.  If you arrive late for your appointment, you may be asked to reschedule.  We strive to give you quality time with our providers, and arriving late affects you and other patients whose appointments are after yours. If you are a no show for multiple scheduled visits, you may be dismissed from the clinic at the providers discretion.     Resources: [facility] Social Workers [phone] for additional information on assistance programs or assistance connecting with community support programs   [place] DSS  336-641-3447: Information regarding food stamps, Medicaid, and utility assistance [place] Access [place] 336-333-6589   [place] [employer]'s shared-ride transportation service for eligible riders who have a disability that prevents them from riding the fixed route bus.   Medicare  Rights Center 800-333-4114 Helps people with Medicare understand their rights and benefits, navigate the Medicare system, and secure the quality healthcare they deserve [employer] 800-227-2345 Assists patients locate various types of support and financial assistance Cancer Care: 1-800-813-HOPE (4673) Provides financial assistance, online support groups, medication/co-pay assistance.   Transportation Assistance for appointments at [facility]: Transportation Coordinator [phone]  Again, thank you for choosing [facility] for your care.       

## 2020-06-25 NOTE — Progress Notes (Signed)
HEMATOLOGY/ONCOLOGY CONSULTATION NOTE  Date of Service: 06/25/2020  Patient Care Team: [name], MD as PCP - General (Family Medicine)  CHIEF COMPLAINTS/PURPOSE OF CONSULTATION:  Concern for Lymphoma  HISTORY OF PRESENTING ILLNESS:   Brian Kaiser is a wonderful 64 y.o. male who has been referred to [place] by Dr. [name] for evaluation and management of concern for lymphoma. Pt is accompanied today by his wife, [name]. The pt reports that he is doing well overall.    The pt reports that last November he had an Upper Endoscopy because he was having abdominal bloating. Pt was found to have gastritis and was given Protonix and Pepcid to use as needed. Pt has not used Protonix and Pepcid in two months. His abdominal bloating has been intermittent since November, but has been better recently.   Last July pt had surgery on his right forearm and was experiencing difficulty sleeping due to him continuously rolling on the arm. This lasted for some time. Pt had his second COVID19 vaccine in February and his second Shingles vaccine two weeks later. After this he began experiencing fatigue, which is still present. His fatigue is not progressive and he is able to complete his daily task. Pt has drenching night sweats that started two months ago. These do not occur every night. He has lost 15 lbs in the last few months without effort, but started back going to the gym in May. Pt has noticed that left inguinal lymph node has fluctuated in size since he was diagnosed with Hepatitis B nearly 20 years ago. Recently the lymph node has grown in size and become more firm. He has also noticed an increase in the size of a right cervical lymph node. Pt has had left testicular discomfort in the last few weeks. He reports b/l ankle swelling, calf cramping and upper left abdominal pain with certain sleeping positions.  His Hepatitis B was picked up after he was experiencing abdominal discomfort and was treated  with milk thistle. It has been monitored via labwork over the years. Pt had a Basal Cell lesion on his left thigh and back a few months ago. They have been removed.   Of note prior to the patient's visit today, pt has had CT C/A/P ([id number]) completed on 06/06/2020 with results revealing "Extensive adenopathy in the chest, abdomen and pelvis. Marked splenomegaly. Findings most concerning for lymphoma. Small amount of free fluid in the abdomen and pelvis."   Most recent lab results (06/03/2020) of CBC w/diff & CMP is as follows: all values are WNL except for PLT at 135K. 06/03/2020 TSH at 7.86 06/03/2020 PSA at 0.20 06/03/2020 Sed Rate at 1  On review of systems, pt reports night sweats, new lumps/bumps, ankle swelling, calf cramping, constipation, change in color of bowel movements, left testicular pain, fatigue, unexpected weight loss, improving abdominal bloating, early satiety and denies SOB, chest pain, fevers, chills, bloody/black stools, headaches, vision changes, bone pain, back pain, flank pain and any other symptoms.   On PMHx the pt reports GERD, Forearm laceration, Right Forearm Ligament Repair, Hepatitis B, Hyperlipidemia. On Family Hx the pt reports that his brother was diagnosed with DLBCL a year ago.  INTERVAL HISTORY:  Brian Kaiser is a wonderful 64 y.o. male who is here for evaluation and management of concern for lymphoma. The patient's last visit with [place] was on 06/11/2020. The pt reports that he is doing well overall.  The pt reports no acute new symptoms.  Still having progressive weight  loss and abdominal fullness and continued left upper quadrant abdominal discomfort especially when sleeping on his right side down.  No fevers or chills.  Of note since the patient's last visit, pt has had PET/CT ([id number]) completed on 06/19/2020 with results revealing "1. Widespread intensely hypermetabolic adenopathy involving the cervical neck lymph nodes, mediastinal nodes, axillary  nodes, precordial nodes, periportal nodes, periaortic retroperitoneal nodes, iliac nodes and inguinal nodes. 2. Massively enlarged hypermetabolic spleen consistent with lymphoma. 3. No pulmonary or liver involvement. 4. No evidence of lymphoma in the marrow space."  Pt has had Right Axillary Lymph Node Flow Pathology ([id number]) completed on 06/20/2020 with results revealing "Monoclonal B-cell population identified."  Pt has had Right Axillary Surgical Pathology Report [id number]) completed on 06/20/2020 with results revealing "Mantle cell lymphoma."  Lab results (06/11/20) of CBC w/diff and CMP is as follows: all values are WNL except for HCT at 38.2, PLT at 125K, Albumin at 3.4. 06/11/2020 Hepatitis B Surface Ag is Non Reactive 06/11/2020 Hep B Core Total Ab is Reactive 06/11/2020 MMP shows all values are WNL 06/11/2020 HBV DNA is undetected 06/11/2020 Sed Rate at 0 06/11/2020 LDH at 490   MEDICAL HISTORY:  Past Medical History:  Diagnosis Date  . Allergy   . Forearm laceration    right forearm  . GERD (gastroesophageal reflux disease)   . Hepatitis B   . Hyperlipidemia   . Pulmonary lesion    Left pulmonary schwannoma  . Schwannoma    left lower lung    SURGICAL HISTORY: Past Surgical History:  Procedure Laterality Date  . COLONOSCOPY    . LIGAMENT REPAIR Right 04/19/2019   Right forearm  . LIPOMA EXCISION Left    neck  . LUNG SURGERY Left   . OTHER SURGICAL HISTORY Left    pulmonary schwannoma excision  . UPPER GASTROINTESTINAL ENDOSCOPY    . WOUND EXPLORATION Right 04/19/2019   Procedure: WOUND EXPLORATION; REPAIR RIGHT FOREARM LACERATION;  Surgeon: Charlotte Crumb, MD;  Location: [facility];  Service: Orthopedics;  Laterality: Right;  AXILLARY BLOCK AND MAC    SOCIAL HISTORY: Social History   Socioeconomic History  . Marital status: Married    Spouse name: Not on file  . Number of children: Not on file  . Years of education: Not on  file  . Highest education level: Not on file  Occupational History  . Not on file  Tobacco Use  . Smoking status: Never Smoker  . Smokeless tobacco: Never Used  Vaping Use  . Vaping Use: Never used  Substance and Sexual Activity  . Alcohol use: No    Alcohol/week: 0.0 standard drinks  . Drug use: No  . Sexual activity: Not on file  Other Topics Concern  . Not on file  Social History Narrative  . Not on file   Social Determinants of Health   Financial Resource Strain:   . Difficulty of Paying Living Expenses: Not on file  Food Insecurity:   . Worried About Charity fundraiser in the Last Year: Not on file  . Ran Out of Food in the Last Year: Not on file  Transportation Needs:   . Lack of Transportation (Medical): Not on file  . Lack of Transportation (Non-Medical): Not on file  Physical Activity:   . Days of Exercise per Week: Not on file  . Minutes of Exercise per Session: Not on file  Stress:   . Feeling of Stress : Not on file  Social Connections:   .  Frequency of Communication with Friends and Family: Not on file  . Frequency of Social Gatherings with Friends and Family: Not on file  . Attends Religious Services: Not on file  . Active Member of Clubs or Organizations: Not on file  . Attends Archivist Meetings: Not on file  . Marital Status: Not on file  Intimate Partner Violence:   . Fear of Current or Ex-Partner: Not on file  . Emotionally Abused: Not on file  . Physically Abused: Not on file  . Sexually Abused: Not on file    FAMILY HISTORY: Family History  Problem Relation Age of Onset  . Macular degeneration Mother   . Dementia Mother   . Diverticulitis Mother   . Prostate cancer Paternal Grandfather   . Diverticulitis Brother   . Colon cancer Neg Hx   . Colon polyps Neg Hx   . Esophageal cancer Neg Hx   . Liver cancer Neg Hx   . Pancreatic cancer Neg Hx   . Rectal cancer Neg Hx   . Stomach cancer Neg Hx     ALLERGIES:  is allergic to  augmentin [amoxicillin-pot clavulanate].  MEDICATIONS:  Current Outpatient Medications  Medication Sig Dispense Refill  . acetaminophen (TYLENOL) 325 MG tablet Take 325 mg by mouth every 6 (six) hours as needed for moderate pain or headache.    . cholecalciferol (VITAMIN D3) 25 MCG (1000 UNIT) tablet Take 1,000 Units by mouth daily.    . famotidine (PEPCID) 20 MG tablet Take 1 tablet (20 mg total) by mouth at bedtime. (Patient taking differently: Take 20 mg by mouth daily as needed for heartburn. ) 30 tablet 3  . Multiple Vitamin (MULTIVITAMIN) tablet Take 1 tablet by mouth daily.    . Omega-3 Fatty Acids (FISH OIL) 1000 MG CAPS Take 1,000 mg by mouth daily.    . pantoprazole (PROTONIX) 20 MG tablet TAKE 1 TABLET BY MOUTH ONCE A DAY (Patient taking differently: Take 20 mg by mouth daily as needed for heartburn. ) 90 tablet 3  . vitamin C (ASCORBIC ACID) 250 MG tablet Take 250 mg by mouth daily.     No current facility-administered medications for this visit.    REVIEW OF SYSTEMS:   A 10+ POINT REVIEW OF SYSTEMS WAS OBTAINED including neurology, dermatology, psychiatry, cardiac, respiratory, lymph, extremities, GI, GU, Musculoskeletal, constitutional, breasts, reproductive, HEENT.  All pertinent positives are noted in the HPI.  All others are negative.   PHYSICAL EXAMINATION: ECOG PERFORMANCE STATUS: 1 - Symptomatic but completely ambulatory  . Vitals:   06/25/20 1158  BP: (!) 146/102  Pulse: 92  Resp: 18  Temp: (!) 97.5 F (36.4 C)  SpO2: 100%   Filed Weights   06/25/20 1158  Weight: 173 lb 9.6 oz (78.7 kg)   .Body mass index is 25.64 kg/m.  NAD, anxious GENERAL:alert, in no acute distress and comfortable SKIN: no acute rashes, no significant lesions EYES: conjunctiva are pink and non-injected, sclera anicteric OROPHARYNX: MMM, no exudates, no oropharyngeal erythema or ulceration NECK: supple, no JVD LYMPH:  no palpable lymphadenopathy in the cervical, axillary or  inguinal regions LUNGS: clear to auscultation b/l with normal respiratory effort HEART: regular rate & rhythm ABDOMEN:  normoactive bowel sounds , mild TTP LUQ, massive splenomegaly Extremity: trace pedal edema PSYCH: alert & oriented x 3 with fluent speech NEURO: no focal motor/sensory deficits  LABORATORY DATA:  I have reviewed the data as listed  . CBC Latest Ref Rng & Units 06/20/2020 06/11/2020  WBC 4.0 - 10.5 K/uL 5.0 5.9  Hemoglobin 13.0 - 17.0 g/dL 13.3 13.1  Hematocrit 39 - 52 % 41.7 38.2(L)  Platelets 150 - 400 K/uL 132(L) 125(L)    . CMP Latest Ref Rng & Units 06/11/2020 11/05/2008  Glucose 70 - 99 mg/dL 82 102(H)  BUN 8 - 23 mg/dL 15 11  Creatinine 0.61 - 1.24 mg/dL 1.10 1.0  Sodium 135 - 145 mmol/L 140 141  Potassium 3.5 - 5.1 mmol/L 4.1 3.6  Chloride 98 - 111 mmol/L 107 104  CO2 22 - 32 mmol/L 25 28  Calcium 8.9 - 10.3 mg/dL 9.1 9.4  Total Protein 6.5 - 8.1 g/dL 6.5 7.2  Total Bilirubin 0.3 - 1.2 mg/dL 0.7 0.7  Alkaline Phos 38 - 126 U/L 73 62  AST 15 - 41 U/L 41 28  ALT 0 - 44 U/L 23 48   06/20/2020 Right Axillary Surgical Pathology Report [id number]):    06/20/2020 Right Axillary Lymph Node Flow Pathology ([id number]):   06/11/2020 Flow Pathology ([id number]):   RADIOGRAPHIC STUDIES: I have personally reviewed the radiological images as listed and agreed with the findings in the report. CT CHEST ABDOMEN PELVIS W CONTRAST  Result Date: 06/07/2020 CLINICAL DATA:  Night sweats, weight loss, adenopathy. Swelling right groin region EXAM: CT CHEST, ABDOMEN, AND PELVIS WITH CONTRAST TECHNIQUE: Multidetector CT imaging of the chest, abdomen and pelvis was performed following the standard protocol during bolus administration of intravenous contrast. CONTRAST:  144m OMNIPAQUE IOHEXOL 300 MG/ML  SOLN COMPARISON:  05/06/2018 FINDINGS: CT CHEST FINDINGS Cardiovascular: Heart is normal size.  Aorta is normal caliber. Mediastinum/Nodes: Extensive bilateral  axillary adenopathy measuring up to 13 mm on the left and 11 mm on the right (short axis diameters). Mediastinal adenopathy. AP window lymph node measures up to 14 mm in short axis diameter. Subcarinal lymph node measures up to 20 mm short axis diameter. Bilateral hilar adenopathy. Index left hilar lymph node measures up to 19 mm short axis diameter. Bilateral cardiophrenic adenopathy with index right cardiophrenic lymph node measuring up to 22 mm short axis diameter. Trachea and esophagus are unremarkable. Thyroid unremarkable. Lungs/Pleura: Lungs are clear. No focal airspace opacities or suspicious nodules. No effusions. Musculoskeletal: Chest wall soft tissues are unremarkable. No acute bony abnormality. CT ABDOMEN PELVIS FINDINGS Hepatobiliary: No focal hepatic abnormality. Gallbladder unremarkable. Pancreas: No focal abnormality or ductal dilatation. Spleen: Markedly enlarged spleen with a craniocaudal length of 26.5 cm. Adrenals/Urinary Tract: No adrenal abnormality. No focal renal abnormality. No stones or hydronephrosis. Urinary bladder is unremarkable. Stomach/Bowel: Stomach, large and small bowel grossly unremarkable. Vascular/Lymphatic: No aneurysm. Retroperitoneal and mesenteric adenopathy. Bulky right upper quadrant nodal mass with a short axis diameter measuring up to 4.8 cm in the porta hepatis. Bulky retroperitoneal adenopathy measuring up to 2.4 cm short axis diameter anterior to the aorta. Bilateral iliac adenopathy and inguinal adenopathy. Index left iliac lymph node measures up to 1.7 cm on image 114. Index right external iliac lymph node measures up to 1.2 cm short axis diameter on image 120. Reproductive: Central calcifications within the prostate. Other: Small amount of free fluid in the abdomen and pelvis. No free air. Musculoskeletal: No acute bony abnormality. IMPRESSION: Extensive adenopathy in the chest, abdomen and pelvis. Marked splenomegaly. Findings most concerning for lymphoma. Small  amount of free fluid in the abdomen and pelvis. Electronically Signed   By: KRolm BaptiseM.D.   On: 06/07/2020 04:30   NM PET Image Initial (PI) Skull Base To Thigh  Result Date:  06/19/2020 CLINICAL DATA:  Initial treatment strategy for non-Hodgkin's lymphoma. EXAM: NUCLEAR MEDICINE PET SKULL BASE TO THIGH TECHNIQUE: 10.1 mCi F-18 FDG was injected intravenously. Full-ring PET imaging was performed from the skull base to thigh after the radiotracer. CT data was obtained and used for attenuation correction and anatomic localization. Fasting blood glucose: 76 mg/dl COMPARISON:  CT 06/06/2020 FINDINGS: Mediastinal blood pool activity: SUV max 1.5 Liver activity: SUV max 3.1 NECK: Multiple small bilateral hypermetabolic cervical lymph nodes. Example RIGHT level 2 lymph node with SUV max equal 8.1. This is one of the larger lymph nodes at 1.5 cm short axis. Incidental CT findings: None CHEST: Bilateral hypermetabolic axillary adenopathy. Example LEFT axial lymph node measures 12 mm short axis with SUV max equal 7.7. Prevascular, internal mammary and mediastinal nodal metastasis. Example subcarinal lymph node measures 20 mm short axis with SUV max 8.4. Anterior mediastinum/internal mammary node on the LEFT measuring 16 mm with SUV max equal 13.2. Hypermetabolic adenopathy in the precordial space. For example 20 mm a nodule anterior to the RIGHT hepatic lobe with SUV max equal 7.2. Incidental CT findings: No pulmonary nodules ABDOMEN/PELVIS: Spleen is massively enlarged measuring 10.8 by 17.6 x 28 cm (volume = 2800 cm^3). Spleen is uniformly hypermetabolic with SUV max equal 6.3. Enlarged hypermetabolic periportal and gastrohepatic ligament lymph nodes. Example periportal node measuring 3.2 cm with SUV max equal 8.3. Hypermetabolic adenopathy extends the retroperitoneal periaortic nodal stations. Adenopathy extends along the iliac chains into the inguinal region. Example LEFT external iliac node along the operator space  measuring 16 mm short axis with SUV max equal 10.3. No hypermetabolic lesion within the liver. Kidneys are normal. Bowel appears normal. Incidental CT findings: none SKELETON: No abnormal activity within the marrow space. Incidental CT findings: none IMPRESSION: 1. Widespread intensely hypermetabolic adenopathy involving the cervical neck lymph nodes, mediastinal nodes, axillary nodes, precordial nodes, periportal nodes, periaortic retroperitoneal nodes, iliac nodes and inguinal nodes. 2. Massively enlarged hypermetabolic spleen consistent with lymphoma. 3. No pulmonary or liver involvement. 4. No evidence of lymphoma in the marrow space. Electronically Signed   By: Suzy Bouchard M.D.   On: 06/19/2020 13:58   [place] CORE BIOPSY (LYMPH NODES)  Result Date: 06/20/2020 INDICATION: 64 year old male referred for biopsy of lymph node, possible lymphoma EXAM: ULTRASOUND-GUIDED LYMPH NODE BIOPSY MEDICATIONS: None ANESTHESIA/SEDATION: Moderate (conscious) sedation was employed during this procedure. A total of Versed mg and Fentanyl mcg was administered intravenously. Moderate Sedation Time: minutes. The patient's level of consciousness and vital signs were monitored continuously by radiology nursing throughout the procedure under my direct supervision. FLUOROSCOPY TIME:  Fluoroscopy Time:  minutes  seconds ( mGy). COMPLICATIONS: None immediate. PROCEDURE: Informed written consent was obtained from the patient after a thorough discussion of the procedural risks, benefits and alternatives. All questions were addressed. Maximal Sterile Barrier Technique was utilized including caps, mask, sterile gowns, sterile gloves, sterile drape, hand hygiene and skin antiseptic. A timeout was performed prior to the initiation of the procedure. Ultrasound survey was performed with images stored and sent to PACs. The right axillary region was prepped with chlorhexidine in a sterile fashion, and a sterile drape was applied covering the  operative field. A sterile gown and sterile gloves were used for the procedure. Local anesthesia was provided with 1% Lidocaine. Ultrasound guidance was used to infiltrate the region with 1% lidocaine for local anesthesia. Small stab incision was made with 11 blade scalpel. Multiple 16 gauge core biopsy were then acquired of the right axillary lymph  node using ultrasound guidance. Images were stored. Tissue placed into saline for transport. Final image was stored after biopsy. Patient tolerated the procedure well and remained hemodynamically stable throughout. No complications were encountered and no significant blood loss was encounter IMPRESSION: Status post ultrasound-guided biopsy of right axillary lymph node Signed, Dulcy Fanny. Dellia Nims, RPVI [facility] Rad[facility] Electronically Signed   By: Corrie Mckusick D.O.   On: 06/20/2020 14:33    ASSESSMENT & PLAN:   63 yo with   1) newly diagnosed stage IV mantle cell lymphoma with extensive lymphadenopathy and massive splenomegaly . 2) massive splenomegaly likely related to mantle cell lymphoma  3) thrombocytopenia-mild platelets of 132k likely related to lymphoma. 4) hepatitis B core antibody positive, surface antigen negative, HBV DNA PCR negative 5) elevated LDH due to lymphoma PLAN: -We had an extensive discussion regarding the patient's PET CT scan showing extensive lymphadenopathy and massive splenomegaly. -We discussed his lymph node biopsy and flow cytometry results and other labs which confirmed the diagnosis of mantle cell lymphoma. -We discussed avoiding excessive straining, contact sports and other activities that might lead to abdominal trauma to reduce the risk of splenic injury -Did start him on allopurinol milligrams p.o. twice daily for tumor lysis syndrome prophylaxis -We will start him on prednisone 60 mg p.o. daily for 5 days to help with shrinking his spleen some and reduce discomfort  pending definitive treatment. -Ativan as needed for nausea anxiety and insomnia -Other supportive medications sent to his pharmacy -Infectious disease referral to weigh in on the need for hepatitis B prophylaxis in the setting of core antibody positivity for hep B and the need for using Rituxan. -Explained the new diagnosis, staging, prognosis and treatment options in details. -We will be proceeding with to six cycles of Bendamustine Rituxan followed by consideration of maintenance Rituxan therapy. -We will consider use of growth factor as needed  FOLLOW UP: Chemo-counseling for Bendamustine/Rituxan for mantle cell lymphoma ASAP Plz schedule to start BR (D1 and D2) in 7 days with labs on D1. MD visit 1 visit post C1D1 of treatment for toxicity check Infectious disease consultation for +ve Hep B core antibody in setting of Rituxan use   The total time spent in the appt was 45 minutes and more than 50% was on counseling and direct patient cares.  All of the patient's questions were answered with apparent satisfaction. The patient knows to call the clinic with any problems, questions or concerns.    Sullivan Lone MD [place] AAHIVMS [facility] Hematology/Oncology Physician [facility]  (Office):       [phone] (Work cell):  [phone] (Fax):           [phone]  06/25/2020 9:18 AM  I, Yevette Edwards, am acting as a scribe for Dr. Sullivan Lone.   .I have reviewed the above documentation for accuracy and completeness, and I agree with the above. Brunetta Genera MD     ADDENDUM   I discussed case with Dr. Linus Salmons on 06/26/2020-no recommendation to consider hepatitis B prophylaxis for core antibody positivity with negative hepatitis B surface antigen and HBV DNA PCR negative.  We will cancel ID consultation  .Brunetta Genera MD

## 2020-06-26 ENCOUNTER — Telehealth: Payer: Self-pay | Admitting: *Deleted

## 2020-06-26 DIAGNOSIS — Z7189 Other specified counseling: Secondary | ICD-10-CM | POA: Insufficient documentation

## 2020-06-26 DIAGNOSIS — C8318 Mantle cell lymphoma, lymph nodes of multiple sites: Secondary | ICD-10-CM | POA: Insufficient documentation

## 2020-06-26 NOTE — Progress Notes (Signed)
START ON PATHWAY REGIMEN - Lymphoma and CLL     A cycle is every 28 days:     Bendamustine      Rituximab-xxxx   **Always confirm dose/schedule in your pharmacy ordering system**  Patient Characteristics: Mantle Cell Lymphoma, First Line, Aggressive Disease or Treatment Indicated, Stage II - IV, Not a Transplant Candidate Disease Type: Not Applicable Disease Type: Mantle Cell Lymphoma Disease Type: Not Applicable Line of Therapy: First Line Patient Characteristics: Not a Transplant Candidate Intent of Therapy: Non-Curative / Palliative Intent, Discussed with Patient

## 2020-06-26 NOTE — Telephone Encounter (Signed)
Brian Kaiser called - wants to know if he needs to have an echo before he starts chemotherapy?  Question routed to Dr. Irene Limbo

## 2020-06-28 ENCOUNTER — Other Ambulatory Visit: Payer: Self-pay | Admitting: *Deleted

## 2020-06-28 ENCOUNTER — Telehealth: Payer: Self-pay | Admitting: Hematology

## 2020-06-28 DIAGNOSIS — C8318 Mantle cell lymphoma, lymph nodes of multiple sites: Secondary | ICD-10-CM

## 2020-06-28 DIAGNOSIS — R161 Splenomegaly, not elsewhere classified: Secondary | ICD-10-CM

## 2020-06-28 MED ORDER — PREDNISONE 20 MG PO TABS: 20.0000 mg | ORAL_TABLET | Freq: Every day | ORAL | Status: DC

## 2020-06-28 MED ORDER — PREDNISONE 20 MG PO TABS: 20.0000 mg | ORAL_TABLET | Freq: Every day | ORAL | 0 refills | Status: AC

## 2020-06-28 NOTE — Telephone Encounter (Signed)
Scheduled per 10/05 los, patient has been called and notified. 

## 2020-06-28 NOTE — Progress Notes (Signed)
Pharmacist Chemotherapy Monitoring - Initial Assessment    Anticipated start date: 07/04/20  Regimen:  . Are orders appropriate based on the patient's diagnosis, regimen, and cycle? Yes . Does the plan date match the patient's scheduled date? Yes . Is the sequencing of drugs appropriate? Yes . Are the premedications appropriate for the patient's regimen? Yes . Prior Authorization for treatment is: Not Started o If applicable, is the correct biosimilar selected based on the patient's insurance? yes  Organ Function and Labs: Marland Kitchen Are dose adjustments needed based on the patient's renal function, hepatic function, or hematologic function? No . Are appropriate labs ordered prior to the start of patient's treatment? Yes . Other organ system assessment, if indicated: N/A . The following baseline labs, if indicated, have been ordered: rituximab: baseline Hepatitis B labs  Dose Assessment: . Are the drug doses appropriate? Yes . Are the following correct: o Drug concentrations Yes o IV fluid compatible with drug Yes o Administration routes Yes o Timing of therapy Yes . If applicable, does the patient have documented access for treatment and/or plans for port-a-cath placement? no . If applicable, have lifetime cumulative doses been properly documented and assessed? not applicable Lifetime Dose Tracking  No doses have been documented on this patient for the following tracked chemicals: Doxorubicin, Epirubicin, Idarubicin, Daunorubicin, Mitoxantrone, Bleomycin, Oxaliplatin, Carboplatin, Liposomal Doxorubicin  o   Toxicity Monitoring/Prevention: . The patient has the following take home antiemetics prescribed: Ondansetron, Prochlorperazine and Dexamethasone . The patient has the following take home medications prescribed: N/A (allopurinol) . Medication allergies and previous infusion related reactions, if applicable, have been reviewed and addressed. Yes . The patient's current medication list  has been assessed for drug-drug interactions with their chemotherapy regimen. no significant drug-drug interactions were identified on review.  Order Review: . Are the treatment plan orders signed? Yes . Is the patient scheduled to see a provider prior to their treatment? No  I verify that I have reviewed each item in the above checklist and answered each question accordingly.  Detric Scalisi, Jacqlyn Larsen 06/28/2020 2:20 PM

## 2020-07-01 ENCOUNTER — Other Ambulatory Visit: Payer: Self-pay

## 2020-07-01 ENCOUNTER — Inpatient Hospital Stay: Payer: 59

## 2020-07-03 ENCOUNTER — Other Ambulatory Visit: Payer: Self-pay | Admitting: *Deleted

## 2020-07-03 DIAGNOSIS — C8318 Mantle cell lymphoma, lymph nodes of multiple sites: Secondary | ICD-10-CM

## 2020-07-04 ENCOUNTER — Other Ambulatory Visit: Payer: Self-pay

## 2020-07-04 ENCOUNTER — Inpatient Hospital Stay: Payer: 59

## 2020-07-04 ENCOUNTER — Other Ambulatory Visit: Payer: Self-pay | Admitting: *Deleted

## 2020-07-04 VITALS — BP 141/81 | HR 78 | Temp 98.4°F | Resp 18

## 2020-07-04 DIAGNOSIS — Z5111 Encounter for antineoplastic chemotherapy: Secondary | ICD-10-CM | POA: Diagnosis not present

## 2020-07-04 DIAGNOSIS — R21 Rash and other nonspecific skin eruption: Secondary | ICD-10-CM | POA: Diagnosis not present

## 2020-07-04 DIAGNOSIS — Z7189 Other specified counseling: Secondary | ICD-10-CM

## 2020-07-04 DIAGNOSIS — C8318 Mantle cell lymphoma, lymph nodes of multiple sites: Secondary | ICD-10-CM

## 2020-07-04 DIAGNOSIS — Z5112 Encounter for antineoplastic immunotherapy: Secondary | ICD-10-CM | POA: Diagnosis not present

## 2020-07-04 DIAGNOSIS — R634 Abnormal weight loss: Secondary | ICD-10-CM | POA: Diagnosis not present

## 2020-07-04 DIAGNOSIS — R7402 Elevation of levels of lactic acid dehydrogenase (LDH): Secondary | ICD-10-CM | POA: Diagnosis not present

## 2020-07-04 DIAGNOSIS — R1012 Left upper quadrant pain: Secondary | ICD-10-CM | POA: Diagnosis not present

## 2020-07-04 DIAGNOSIS — R161 Splenomegaly, not elsewhere classified: Secondary | ICD-10-CM | POA: Diagnosis not present

## 2020-07-04 DIAGNOSIS — D696 Thrombocytopenia, unspecified: Secondary | ICD-10-CM | POA: Diagnosis not present

## 2020-07-04 DIAGNOSIS — C8314 Mantle cell lymphoma, lymph nodes of axilla and upper limb: Secondary | ICD-10-CM | POA: Diagnosis not present

## 2020-07-04 LAB — CMP (CANCER CENTER ONLY)
ALT: 44 U/L (ref 0–44)
AST: 35 U/L (ref 15–41)
Albumin: 4.1 g/dL (ref 3.5–5.0)
Alkaline Phosphatase: 75 U/L (ref 38–126)
Anion gap: 7 (ref 5–15)
BUN: 26 mg/dL — ABNORMAL HIGH (ref 8–23)
CO2: 29 mmol/L (ref 22–32)
Calcium: 9.5 mg/dL (ref 8.9–10.3)
Chloride: 103 mmol/L (ref 98–111)
Creatinine: 1.15 mg/dL (ref 0.61–1.24)
GFR, Estimated: >60 mL/min (ref >60)
Glucose, Bld: 65 mg/dL — ABNORMAL LOW (ref 70–99)
Potassium: 3.7 mmol/L (ref 3.5–5.1)
Sodium: 139 mmol/L (ref 135–145)
Total Bilirubin: 0.5 mg/dL (ref 0.3–1.2)
Total Protein: 6.1 g/dL — ABNORMAL LOW (ref 6.5–8.1)

## 2020-07-04 LAB — CBC WITH DIFFERENTIAL (CANCER CENTER ONLY)
Abs Immature Granulocytes: 0.04 10*3/uL (ref 0.00–0.07)
Basophils Absolute: 0 10*3/uL (ref 0.0–0.1)
Basophils Relative: 1 %
Eosinophils Absolute: 0 10*3/uL (ref 0.0–0.5)
Eosinophils Relative: 1 %
HCT: 38.1 % — ABNORMAL LOW (ref 39.0–52.0)
Hemoglobin: 12.5 g/dL — ABNORMAL LOW (ref 13.0–17.0)
Immature Granulocytes: 1 %
Lymphocytes Relative: 38 %
Lymphs Abs: 2.2 10*3/uL (ref 0.7–4.0)
MCH: 30.2 pg (ref 26.0–34.0)
MCHC: 32.8 g/dL (ref 30.0–36.0)
MCV: 92 fL (ref 80.0–100.0)
Monocytes Absolute: 0.6 10*3/uL (ref 0.1–1.0)
Monocytes Relative: 11 %
Neutro Abs: 2.8 10*3/uL (ref 1.7–7.7)
Neutrophils Relative %: 48 %
Platelet Count: 142 10*3/uL — ABNORMAL LOW (ref 150–400)
RBC: 4.14 MIL/uL — ABNORMAL LOW (ref 4.22–5.81)
RDW: 14.1 % (ref 11.5–15.5)
WBC Count: 5.7 10*3/uL (ref 4.0–10.5)
nRBC: 0 % (ref 0.0–0.2)

## 2020-07-04 LAB — LACTATE DEHYDROGENASE: LDH: 333 U/L — ABNORMAL HIGH (ref 98–192)

## 2020-07-04 LAB — URIC ACID: Uric Acid, Serum: 4.3 mg/dL (ref 3.7–8.6)

## 2020-07-04 MED ORDER — FAMOTIDINE IN NACL 20-0.9 MG/50ML-% IV SOLN
INTRAVENOUS | Status: AC
  Filled 2020-07-04: qty 50

## 2020-07-04 MED ORDER — SODIUM CHLORIDE 0.9 % IV SOLN
Freq: Once | INTRAVENOUS | Status: AC
  Filled 2020-07-04: qty 250

## 2020-07-04 MED ORDER — DEXAMETHASONE 4 MG PO TABS: 8.0000 mg | ORAL_TABLET | Freq: Every day | ORAL | 1 refills | Status: DC

## 2020-07-04 MED ORDER — SODIUM CHLORIDE 0.9% FLUSH
10.0000 mL | INTRAVENOUS | Status: DC | PRN
  Filled 2020-07-04: qty 10

## 2020-07-04 MED ORDER — SODIUM CHLORIDE 0.9 % IV SOLN
90.0000 mg/m2 | Freq: Once | INTRAVENOUS | Status: AC
  Administered 2020-07-04: 175 mg via INTRAVENOUS
  Filled 2020-07-04: qty 7

## 2020-07-04 MED ORDER — HEPARIN SOD (PORK) LOCK FLUSH 100 UNIT/ML IV SOLN
500.0000 [IU] | Freq: Once | INTRAVENOUS | Status: DC | PRN
  Filled 2020-07-04: qty 5

## 2020-07-04 MED ORDER — PALONOSETRON HCL INJECTION 0.25 MG/5ML
0.2500 mg | Freq: Once | INTRAVENOUS | Status: AC
  Administered 2020-07-04: 0.25 mg via INTRAVENOUS

## 2020-07-04 MED ORDER — ONDANSETRON HCL 8 MG PO TABS: 8.0000 mg | ORAL_TABLET | Freq: Two times a day (BID) | ORAL | 1 refills | Status: DC | PRN

## 2020-07-04 MED ORDER — SODIUM CHLORIDE 0.9 % IV SOLN
375.0000 mg/m2 | Freq: Once | INTRAVENOUS | Status: AC
  Administered 2020-07-04: 700 mg via INTRAVENOUS
  Filled 2020-07-04: qty 20

## 2020-07-04 MED ORDER — PALONOSETRON HCL INJECTION 0.25 MG/5ML
INTRAVENOUS | Status: AC
  Filled 2020-07-04: qty 5

## 2020-07-04 MED ORDER — DIPHENHYDRAMINE HCL 25 MG PO CAPS
50.0000 mg | ORAL_CAPSULE | Freq: Once | ORAL | Status: AC
  Administered 2020-07-04: 50 mg via ORAL

## 2020-07-04 MED ORDER — ACETAMINOPHEN 325 MG PO TABS
ORAL_TABLET | ORAL | Status: AC
  Filled 2020-07-04: qty 2

## 2020-07-04 MED ORDER — ACETAMINOPHEN 325 MG PO TABS
650.0000 mg | ORAL_TABLET | Freq: Once | ORAL | Status: AC
  Administered 2020-07-04: 650 mg via ORAL

## 2020-07-04 MED ORDER — DIPHENHYDRAMINE HCL 25 MG PO CAPS
ORAL_CAPSULE | ORAL | Status: AC
  Filled 2020-07-04: qty 2

## 2020-07-04 MED ORDER — FAMOTIDINE IN NACL 20-0.9 MG/50ML-% IV SOLN
20.0000 mg | Freq: Once | INTRAVENOUS | Status: AC
  Administered 2020-07-04: 20 mg via INTRAVENOUS

## 2020-07-04 MED ORDER — SODIUM CHLORIDE 0.9 % IV SOLN
10.0000 mg | Freq: Once | INTRAVENOUS | Status: AC
  Administered 2020-07-04: 10 mg via INTRAVENOUS
  Filled 2020-07-04: qty 10

## 2020-07-04 MED ORDER — PROCHLORPERAZINE MALEATE 10 MG PO TABS: 10.0000 mg | ORAL_TABLET | Freq: Four times a day (QID) | ORAL | 1 refills | Status: DC | PRN

## 2020-07-04 NOTE — Patient Instructions (Signed)
Famotidine injection What is this medicine? FAMOTIDINE (fa MOE ti deen) is a type of antihistamine that blocks the release of stomach acid. It is used to treat stomach or intestinal ulcers. It can relieve ulcer pain and discomfort, and the heartburn from acid reflux. This medicine may be used for other purposes; ask your health care provider or pharmacist if you have questions. COMMON BRAND NAME(S): Pepcid What should I tell my health care provider before I take this medicine? They need to know if you have any of these conditions:  kidney or liver disease  an unusual or allergic reaction to famotidine, other medicines, foods, dyes, or preservatives  pregnant or trying to get pregnant  breast-feeding How should I use this medicine? This medicine is for infusion into a vein. It is given by a health care professional in a hospital or clinic setting. Talk to your pediatrician regarding the use of this medicine in children. Special care may be needed. Overdosage: If you think you have taken too much of this medicine contact a poison control center or emergency room at once. NOTE: This medicine is only for you. Do not share this medicine with others. What if I miss a dose? This does not apply. What may interact with this medicine?  delavirdine  itraconazole  ketoconazole This list may not describe all possible interactions. Give your health care provider a list of all the medicines, herbs, non-prescription drugs, or dietary supplements you use. Also tell them if you smoke, drink alcohol, or use illegal drugs. Some items may interact with your medicine. What should I watch for while using this medicine? Tell your doctor or health care professional if your condition does not start to get better or gets worse. Do not take with aspirin, ibuprofen, or other antiinflammatory medicines. These can aggravate your condition. Do not smoke cigarettes or drink alcohol. These increase irritation in your  stomach and can increase the time it will take for ulcers to heal. Cigarettes and alcohol can also worsen acid reflux or heartburn. If you get black, tarry stools or vomit up what looks like coffee grounds, call your doctor or health care professional at once. You may have a bleeding ulcer. This medicine may cause a decrease in vitamin B12. You should make sure that you get enough vitamin B12 while you are taking this medicine. Discuss the foods you eat and the vitamins you take with your health care professional. What side effects may I notice from receiving this medicine? Side effects that you should report to your doctor or health care professional as soon as possible:  allergic reactions like skin rash, itching or hives, swelling of the face, lips, or tongue  agitation, nervousness  confusion  hallucinations Side effects that usually do not require medical attention (report to your doctor or health care professional if they continue or are bothersome):  constipation  diarrhea  dizziness  headache This list may not describe all possible side effects. Call your doctor for medical advice about side effects. You may report side effects to FDA at 1-800-FDA-1088. Where should I keep my medicine? This medicine is given in a hospital or clinic. You will not be given this medicine to store at home. NOTE: This sheet is a summary. It may not cover all possible information. If you have questions about this medicine, talk to your doctor, pharmacist, or health care provider.  2020 Elsevier/Gold Standard (2017-04-23 13:16:46) Diphenhydramine capsules or tablets What is this medicine? DIPHENHYDRAMINE (dye fen HYE dra meen)  is an antihistamine. It is used to treat the symptoms of an allergic reaction. It is also used to treat Parkinson's disease. This medicine is also used to prevent and to treat motion sickness and as a nighttime sleep aid. This medicine may be used for other purposes; ask your  health care provider or pharmacist if you have questions. COMMON BRAND NAME(S): Alka-Seltzer Plus Allergy, Aller-G-Time, Banophen, Benadryl Allergy, Benadryl Allergy Dye Free, Benadryl Allergy Kapgel, Benadryl Allergy Ultratab, Diphedryl, Diphenhist, Genahist, Geri-Dryl, PHARBEDRYL, Q-Dryl, Gretta Began, Valu-Dryl, Vicks ZzzQuil Nightime Sleep-Aid What should I tell my health care provider before I take this medicine? They need to know if you have any of these conditions:  asthma or lung disease  glaucoma  high blood pressure or heart disease  liver disease  pain or difficulty passing urine  prostate trouble  ulcers or other stomach problems  an unusual or allergic reaction to diphenhydramine, other medicines foods, dyes, or preservatives such as sulfites  pregnant or trying to get pregnant  breast-feeding How should I use this medicine? Take this medicine by mouth with a full glass of water. Follow the directions on the prescription label. Take your doses at regular intervals. Do not take your medicine more often than directed. To prevent motion sickness start taking this medicine 30 to 60 minutes before you leave. Talk to your pediatrician regarding the use of this medicine in children. Special care may be needed. Patients over 48 years old may have a stronger reaction and need a smaller dose. Overdosage: If you think you have taken too much of this medicine contact a poison control center or emergency room at once. NOTE: This medicine is only for you. Do not share this medicine with others. What if I miss a dose? If you miss a dose, take it as soon as you can. If it is almost time for your next dose, take only that dose. Do not take double or extra doses. What may interact with this medicine? Do not take this medicine with any of the following medications:  MAOIs like Carbex, Eldepryl, Marplan, Nardil, and Parnate This medicine may also interact with the following  medications:  alcohol  barbiturates, like phenobarbital  medicines for bladder spasm like oxybutynin, tolterodine  medicines for blood pressure  medicines for depression, anxiety, or psychotic disturbances  medicines for movement abnormalities or Parkinson's disease  medicines for sleep  other medicines for cold, cough or allergy  some medicines for the stomach like chlordiazepoxide, dicyclomine This list may not describe all possible interactions. Give your health care provider a list of all the medicines, herbs, non-prescription drugs, or dietary supplements you use. Also tell them if you smoke, drink alcohol, or use illegal drugs. Some items may interact with your medicine. What should I watch for while using this medicine? Visit your doctor or health care professional for regular check ups. Tell your doctor if your symptoms do not improve or if they get worse. Your mouth may get dry. Chewing sugarless gum or sucking hard candy, and drinking plenty of water may help. Contact your doctor if the problem does not go away or is severe. This medicine may cause dry eyes and blurred vision. If you wear contact lenses you may feel some discomfort. Lubricating drops may help. See your eye doctor if the problem does not go away or is severe. You may get drowsy or dizzy. Do not drive, use machinery, or do anything that needs mental alertness until you know how this medicine affects  you. Do not stand or sit up quickly, especially if you are an older patient. This reduces the risk of dizzy or fainting spells. Alcohol may interfere with the effect of this medicine. Avoid alcoholic drinks. What side effects may I notice from receiving this medicine? Side effects that you should report to your doctor or health care professional as soon as possible:  allergic reactions like skin rash, itching or hives, swelling of the face, lips, or tongue  changes in vision  confused, agitated,  nervous  irregular or fast heartbeat  tremor  trouble passing urine  unusual bleeding or bruising  unusually weak or tired Side effects that usually do not require medical attention (report to your doctor or health care professional if they continue or are bothersome):  constipation, diarrhea  drowsy  headache  loss of appetite  stomach upset, vomiting  thick mucous This list may not describe all possible side effects. Call your doctor for medical advice about side effects. You may report side effects to FDA at 1-800-FDA-1088. Where should I keep my medicine? Keep out of the reach of children. This medicine can be abused. Keep your medicine in a safe place. Store at room temperature between 15 and 30 degrees C (59 and 86 degrees F). Keep container closed tightly. Throw away any unused medicine after the expiration date. NOTE: This sheet is a summary. It may not cover all possible information. If you have questions about this medicine, talk to your doctor, pharmacist, or health care provider.  2020 Elsevier/Gold Standard (2019-06-16 10:18:35) Acetaminophen tablets or caplets What is this medicine? ACETAMINOPHEN (a set a MEE noe fen) is a pain reliever. It is used to treat mild pain and fever. This medicine may be used for other purposes; ask your health care provider or pharmacist if you have questions. COMMON BRAND NAME(S): Aceta, Actamin, Anacin Aspirin Free, Genapap, Genebs, Mapap, Pain & Fever, Pain and Fever, PAIN RELIEF, PAIN RELIEF Extra Strength, Pain Reliever, Panadol, PHARBETOL, Q-Pap, Q-Pap Extra Strength, Tylenol, Tylenol CrushableTablet, Tylenol Extra Strength, XS No Aspirin, XS Pain Reliever What should I tell my health care provider before I take this medicine? They need to know if you have any of these conditions:  if you often drink alcohol  liver disease  an unusual or allergic reaction to acetaminophen, other medicines, foods, dyes, or  preservatives  pregnant or trying to get pregnant  breast-feeding How should I use this medicine? Take this medicine by mouth with a glass of water. Follow the directions on the package or prescription label. Take your medicine at regular intervals. Do not take your medicine more often than directed. Talk to your pediatrician regarding the use of this medicine in children. While this drug may be prescribed for children as young as 54 years of age for selected conditions, precautions do apply. Overdosage: If you think you have taken too much of this medicine contact a poison control center or emergency room at once. NOTE: This medicine is only for you. Do not share this medicine with others. What if I miss a dose? If you miss a dose, take it as soon as you can. If it is almost time for your next dose, take only that dose. Do not take double or extra doses. What may interact with this medicine?  alcohol  imatinib  isoniazid  other medicines with acetaminophen This list may not describe all possible interactions. Give your health care provider a list of all the medicines, herbs, non-prescription drugs, or dietary  supplements you use. Also tell them if you smoke, drink alcohol, or use illegal drugs. Some items may interact with your medicine. What should I watch for while using this medicine? Tell your doctor or health care professional if the pain lasts more than 10 days (5 days for children), if it gets worse, or if there is a new or different kind of pain. Also, check with your doctor if a fever lasts for more than 3 days. Do not take other medicines that contain acetaminophen with this medicine. Always read labels carefully. If you have questions, ask your doctor or pharmacist. If you take too much acetaminophen get medical help right away. Too much acetaminophen can be very dangerous and cause liver damage. Even if you do not have symptoms, it is important to get help right away. What side  effects may I notice from receiving this medicine? Side effects that you should report to your doctor or health care professional as soon as possible:  allergic reactions like skin rash, itching or hives, swelling of the face, lips, or tongue  breathing problems  fever or sore throat  redness, blistering, peeling or loosening of the skin, including inside the mouth  trouble passing urine or change in the amount of urine  unusual bleeding or bruising  unusually weak or tired  yellowing of the eyes or skin Side effects that usually do not require medical attention (report to your doctor or health care professional if they continue or are bothersome):  headache  nausea, stomach upset This list may not describe all possible side effects. Call your doctor for medical advice about side effects. You may report side effects to FDA at 1-800-FDA-1088. Where should I keep my medicine? Keep out of reach of children. Store at room temperature between 20 and 25 degrees C (68 and 77 degrees F). Protect from moisture and heat. Throw away any unused medicine after the expiration date. NOTE: This sheet is a summary. It may not cover all possible information. If you have questions about this medicine, talk to your doctor, pharmacist, or health care provider.  2020 Elsevier/Gold Standard (2013-05-01 12:54:16) Palonosetron Injection What is this medicine? PALONOSETRON (pal oh NOE se tron) is used to prevent nausea and vomiting caused by chemotherapy. It also helps prevent delayed nausea and vomiting that may occur a few days after your treatment. This medicine may be used for other purposes; ask your health care provider or pharmacist if you have questions. COMMON BRAND NAME(S): Aloxi What should I tell my health care provider before I take this medicine? They need to know if you have any of these conditions:  an unusual or allergic reaction to palonosetron, dolasetron, granisetron, ondansetron,  other medicines, foods, dyes, or preservatives  pregnant or trying to get pregnant  breast-feeding How should I use this medicine? This medicine is for infusion into a vein. It is given by a health care professional in a hospital or clinic setting. Talk to your pediatrician regarding the use of this medicine in children. While this drug may be prescribed for children as young as 1 month for selected conditions, precautions do apply. Overdosage: If you think you have taken too much of this medicine contact a poison control center or emergency room at once. NOTE: This medicine is only for you. Do not share this medicine with others. What if I miss a dose? This does not apply. What may interact with this medicine?  certain medicines for depression, anxiety, or psychotic disturbances  fentanyl  linezolid  MAOIs like Carbex, Eldepryl, Marplan, Nardil, and Parnate  methylene blue (injected into a vein)  tramadol This list may not describe all possible interactions. Give your health care provider a list of all the medicines, herbs, non-prescription drugs, or dietary supplements you use. Also tell them if you smoke, drink alcohol, or use illegal drugs. Some items may interact with your medicine. What should I watch for while using this medicine? Your condition will be monitored carefully while you are receiving this medicine. What side effects may I notice from receiving this medicine? Side effects that you should report to your doctor or health care professional as soon as possible:  allergic reactions like skin rash, itching or hives, swelling of the face, lips, or tongue  breathing problems  confusion  dizziness  fast, irregular heartbeat  fever and chills  loss of balance or coordination  seizures  sweating  swelling of the hands and feet  tremors  unusually weak or tired Side effects that usually do not require medical attention (report to your doctor or health care  professional if they continue or are bothersome):  constipation or diarrhea  headache This list may not describe all possible side effects. Call your doctor for medical advice about side effects. You may report side effects to FDA at 1-800-FDA-1088. Where should I keep my medicine? This drug is given in a hospital or clinic and will not be stored at home. NOTE: This sheet is a summary. It may not cover all possible information. If you have questions about this medicine, talk to your doctor, pharmacist, or health care provider.  2020 Elsevier/Gold Standard (2013-07-14 10:38:36) Rituximab injection What is this medicine? RITUXIMAB (ri TUX i mab) is a monoclonal antibody. It is used to treat certain types of cancer like non-Hodgkin lymphoma and chronic lymphocytic leukemia. It is also used to treat rheumatoid arthritis, granulomatosis with polyangiitis (or Wegener's granulomatosis), microscopic polyangiitis, and pemphigus vulgaris. This medicine may be used for other purposes; ask your health care provider or pharmacist if you have questions. COMMON BRAND NAME(S): Rituxan, RUXIENCE What should I tell my health care provider before I take this medicine? They need to know if you have any of these conditions:  heart disease  infection (especially a virus infection such as hepatitis B, chickenpox, cold sores, or herpes)  immune system problems  irregular heartbeat  kidney disease  low blood counts, like low white cell, platelet, or red cell counts  lung or breathing disease, like asthma  recently received or scheduled to receive a vaccine  an unusual or allergic reaction to rituximab, other medicines, foods, dyes, or preservatives  pregnant or trying to get pregnant  breast-feeding How should I use this medicine? This medicine is for infusion into a vein. It is administered in a hospital or clinic by a specially trained health care professional. A special MedGuide will be given to  you by the pharmacist with each prescription and refill. Be sure to read this information carefully each time. Talk to your pediatrician regarding the use of this medicine in children. This medicine is not approved for use in children. Overdosage: If you think you have taken too much of this medicine contact a poison control center or emergency room at once. NOTE: This medicine is only for you. Do not share this medicine with others. What if I miss a dose? It is important not to miss a dose. Call your doctor or health care professional if you are unable to keep an  appointment. What may interact with this medicine?  cisplatin  live virus vaccines This list may not describe all possible interactions. Give your health care provider a list of all the medicines, herbs, non-prescription drugs, or dietary supplements you use. Also tell them if you smoke, drink alcohol, or use illegal drugs. Some items may interact with your medicine. What should I watch for while using this medicine? Your condition will be monitored carefully while you are receiving this medicine. You may need blood work done while you are taking this medicine. This medicine can cause serious allergic reactions. To reduce your risk you may need to take medicine before treatment with this medicine. Take your medicine as directed. In some patients, this medicine may cause a serious brain infection that may cause death. If you have any problems seeing, thinking, speaking, walking, or standing, tell your healthcare professional right away. If you cannot reach your healthcare professional, urgently seek other source of medical care. Call your doctor or health care professional for advice if you get a fever, chills or sore throat, or other symptoms of a cold or flu. Do not treat yourself. This drug decreases your body's ability to fight infections. Try to avoid being around people who are sick. Do not become pregnant while taking this medicine  or for at least 12 months after stopping it. Women should inform their doctor if they wish to become pregnant or think they might be pregnant. There is a potential for serious side effects to an unborn child. Talk to your health care professional or pharmacist for more information. Do not breast-feed an infant while taking this medicine or for at least 6 months after stopping it. What side effects may I notice from receiving this medicine? Side effects that you should report to your doctor or health care professional as soon as possible:  allergic reactions like skin rash, itching or hives; swelling of the face, lips, or tongue  breathing problems  chest pain  changes in vision  diarrhea  headache with fever, neck stiffness, sensitivity to light, nausea, or confusion  fast, irregular heartbeat  loss of memory  low blood counts - this medicine may decrease the number of white blood cells, red blood cells and platelets. You may be at increased risk for infections and bleeding.  mouth sores  problems with balance, talking, or walking  redness, blistering, peeling or loosening of the skin, including inside the mouth  signs of infection - fever or chills, cough, sore throat, pain or difficulty passing urine  signs and symptoms of kidney injury like trouble passing urine or change in the amount of urine  signs and symptoms of liver injury like dark yellow or brown urine; general ill feeling or flu-like symptoms; light-colored stools; loss of appetite; nausea; right upper belly pain; unusually weak or tired; yellowing of the eyes or skin  signs and symptoms of low blood pressure like dizziness; feeling faint or lightheaded, falls; unusually weak or tired  stomach pain  swelling of the ankles, feet, hands  unusual bleeding or bruising  vomiting Side effects that usually do not require medical attention (report to your doctor or health care professional if they continue or are  bothersome):  headache  joint pain  muscle cramps or muscle pain  nausea  tiredness This list may not describe all possible side effects. Call your doctor for medical advice about side effects. You may report side effects to FDA at 1-800-FDA-1088. Where should I keep my medicine? This drug  is given in a hospital or clinic and will not be stored at home. NOTE: This sheet is a summary. It may not cover all possible information. If you have questions about this medicine, talk to your doctor, pharmacist, or health care provider.  2020 Elsevier/Gold Standard (2018-10-19 22:01:36) Bendamustine Injection What is this medicine? BENDAMUSTINE (BEN da MUS teen) is a chemotherapy drug. It is used to treat chronic lymphocytic leukemia and non-Hodgkin lymphoma. This medicine may be used for other purposes; ask your health care provider or pharmacist if you have questions. COMMON BRAND NAME(S): Kristine Royal, Treanda What should I tell my health care provider before I take this medicine? They need to know if you have any of these conditions:  infection (especially a virus infection such as chickenpox, cold sores, or herpes)  kidney disease  liver disease  an unusual or allergic reaction to bendamustine, mannitol, other medicines, foods, dyes, or preservatives  pregnant or trying to get pregnant  breast-feeding How should I use this medicine? This medicine is for infusion into a vein. It is given by a health care professional in a hospital or clinic setting. Talk to your pediatrician regarding the use of this medicine in children. Special care may be needed. Overdosage: If you think you have taken too much of this medicine contact a poison control center or emergency room at once. NOTE: This medicine is only for you. Do not share this medicine with others. What if I miss a dose? It is important not to miss your dose. Call your doctor or health care professional if you are unable to keep  an appointment. What may interact with this medicine? Do not take this medicine with any of the following medications:  clozapine This medicine may also interact with the following medications:  atazanavir  cimetidine  ciprofloxacin  enoxacin  fluvoxamine  medicines for seizures like carbamazepine and phenobarbital  mexiletine  rifampin  tacrine  thiabendazole  zileuton This list may not describe all possible interactions. Give your health care provider a list of all the medicines, herbs, non-prescription drugs, or dietary supplements you use. Also tell them if you smoke, drink alcohol, or use illegal drugs. Some items may interact with your medicine. What should I watch for while using this medicine? This drug may make you feel generally unwell. This is not uncommon, as chemotherapy can affect healthy cells as well as cancer cells. Report any side effects. Continue your course of treatment even though you feel ill unless your doctor tells you to stop. You may need blood work done while you are taking this medicine. Call your doctor or healthcare provider for advice if you get a fever, chills or sore throat, or other symptoms of a cold or flu. Do not treat yourself. This drug decreases your body's ability to fight infections. Try to avoid being around people who are sick. This medicine may cause serious skin reactions. They can happen weeks to months after starting the medicine. Contact your healthcare provider right away if you notice fevers or flu-like symptoms with a rash. The rash may be red or purple and then turn into blisters or peeling of the skin. Or, you might notice a red rash with swelling of the face, lips or lymph nodes in your neck or under your arms. This medicine may increase your risk to bruise or bleed. Call your doctor or healthcare provider if you notice any unusual bleeding. Talk to your doctor about your risk of cancer. You may be more  at risk for certain  types of cancers if you take this medicine. Do not become pregnant while taking this medicine or for at least 6 months after stopping it. Women should inform their doctor if they wish to become pregnant or think they might be pregnant. Men should not father a child while taking this medicine and for at least 3 months after stopping it. There is a potential for serious side effects to an unborn child. Talk to your healthcare provider or pharmacist for more information. Do not breast-feed an infant while taking this medicine or for at least 1 week after stopping it. This medicine may make it more difficult to father a child. You should talk with your doctor or healthcare provider if you are concerned about your fertility. What side effects may I notice from receiving this medicine? Side effects that you should report to your doctor or health care professional as soon as possible:  allergic reactions like skin rash, itching or hives, swelling of the face, lips, or tongue  low blood counts - this medicine may decrease the number of white blood cells, red blood cells and platelets. You may be at increased risk for infections and bleeding.  rash, fever, and swollen lymph nodes  redness, blistering, peeling, or loosening of the skin, including inside the mouth  signs of infection like fever or chills, cough, sore throat, pain or difficulty passing urine  signs of decreased platelets or bleeding like bruising, pinpoint red spots on the skin, black, tarry stools, blood in the urine  signs of decreased red blood cells like being unusually weak or tired, fainting spells, lightheadedness  signs and symptoms of kidney injury like trouble passing urine or change in the amount of urine  signs and symptoms of liver injury like dark yellow or brown urine; general ill feeling or flu-like symptoms; light-colored stools; loss of appetite; nausea; right upper belly pain; unusually weak or tired; yellowing of the  eyes or skin Side effects that usually do not require medical attention (report to your doctor or health care professional if they continue or are bothersome):  constipation  decreased appetite  diarrhea  headache  mouth sores  nausea, vomiting  tiredness This list may not describe all possible side effects. Call your doctor for medical advice about side effects. You may report side effects to FDA at 1-800-FDA-1088. Where should I keep my medicine? This drug is given in a hospital or clinic and will not be stored at home. NOTE: This sheet is a summary. It may not cover all possible information. If you have questions about this medicine, talk to your doctor, pharmacist, or health care provider.  2020 Elsevier/Gold Standard (2018-11-29 10:26:46)

## 2020-07-05 ENCOUNTER — Inpatient Hospital Stay: Payer: 59

## 2020-07-05 VITALS — BP 151/87 | HR 70 | Temp 97.9°F | Resp 18

## 2020-07-05 DIAGNOSIS — D696 Thrombocytopenia, unspecified: Secondary | ICD-10-CM | POA: Diagnosis not present

## 2020-07-05 DIAGNOSIS — C8318 Mantle cell lymphoma, lymph nodes of multiple sites: Secondary | ICD-10-CM

## 2020-07-05 DIAGNOSIS — Z5111 Encounter for antineoplastic chemotherapy: Secondary | ICD-10-CM | POA: Diagnosis not present

## 2020-07-05 DIAGNOSIS — R161 Splenomegaly, not elsewhere classified: Secondary | ICD-10-CM | POA: Diagnosis not present

## 2020-07-05 DIAGNOSIS — Z5112 Encounter for antineoplastic immunotherapy: Secondary | ICD-10-CM | POA: Diagnosis not present

## 2020-07-05 DIAGNOSIS — C8314 Mantle cell lymphoma, lymph nodes of axilla and upper limb: Secondary | ICD-10-CM | POA: Diagnosis not present

## 2020-07-05 DIAGNOSIS — R7402 Elevation of levels of lactic acid dehydrogenase (LDH): Secondary | ICD-10-CM | POA: Diagnosis not present

## 2020-07-05 DIAGNOSIS — R634 Abnormal weight loss: Secondary | ICD-10-CM | POA: Diagnosis not present

## 2020-07-05 DIAGNOSIS — Z7189 Other specified counseling: Secondary | ICD-10-CM

## 2020-07-05 DIAGNOSIS — R1012 Left upper quadrant pain: Secondary | ICD-10-CM | POA: Diagnosis not present

## 2020-07-05 DIAGNOSIS — R21 Rash and other nonspecific skin eruption: Secondary | ICD-10-CM | POA: Diagnosis not present

## 2020-07-05 MED ORDER — SODIUM CHLORIDE 0.9 % IV SOLN
10.0000 mg | Freq: Once | INTRAVENOUS | Status: AC
  Administered 2020-07-05: 10 mg via INTRAVENOUS
  Filled 2020-07-05: qty 10

## 2020-07-05 MED ORDER — SODIUM CHLORIDE 0.9 % IV SOLN
Freq: Once | INTRAVENOUS | Status: AC
  Filled 2020-07-05: qty 250

## 2020-07-05 MED ORDER — SODIUM CHLORIDE 0.9 % IV SOLN
90.0000 mg/m2 | Freq: Once | INTRAVENOUS | Status: AC
  Administered 2020-07-05: 175 mg via INTRAVENOUS
  Filled 2020-07-05: qty 7

## 2020-07-05 NOTE — Patient Instructions (Signed)
[facility] Discharge Instructions for Patients Receiving Chemotherapy  Today you received the following chemotherapy agents Bendeka.  To help prevent nausea and vomiting after your treatment, we encourage you to take your nausea medication as directed.   If you develop nausea and vomiting that is not controlled by your nausea medication, call the clinic.   BELOW ARE SYMPTOMS THAT SHOULD BE REPORTED IMMEDIATELY:  *FEVER GREATER THAN 100.5 F  *CHILLS WITH OR WITHOUT FEVER  NAUSEA AND VOMITING THAT IS NOT CONTROLLED WITH YOUR NAUSEA MEDICATION  *UNUSUAL SHORTNESS OF BREATH  *UNUSUAL BRUISING OR BLEEDING  TENDERNESS IN MOUTH AND THROAT WITH OR WITHOUT PRESENCE OF ULCERS  *URINARY PROBLEMS  *BOWEL PROBLEMS  UNUSUAL RASH Items with * indicate a potential emergency and should be followed up as soon as possible.  Feel free to call the clinic should you have any questions or concerns. The clinic phone number is [phone].  Please show the CHEMO ALERT CARD at check-in to the Emergency Department and triage nurse.   

## 2020-07-05 NOTE — Progress Notes (Signed)
Pt discharged in no apparent distress. Pt left ambulatory without assistance. Pt aware of discharge instructions and verbalized understanding and had no further questions.  

## 2020-07-08 ENCOUNTER — Other Ambulatory Visit: Payer: Self-pay | Admitting: Hematology

## 2020-07-09 DIAGNOSIS — Z760 Encounter for issue of repeat prescription: Secondary | ICD-10-CM | POA: Diagnosis not present

## 2020-07-10 ENCOUNTER — Other Ambulatory Visit: Payer: Self-pay | Admitting: *Deleted

## 2020-07-10 ENCOUNTER — Inpatient Hospital Stay: Payer: 59

## 2020-07-10 ENCOUNTER — Other Ambulatory Visit: Payer: Self-pay

## 2020-07-10 ENCOUNTER — Inpatient Hospital Stay (HOSPITAL_BASED_OUTPATIENT_CLINIC_OR_DEPARTMENT_OTHER): Payer: 59 | Admitting: Hematology

## 2020-07-10 VITALS — BP 121/76 | HR 71 | Temp 97.5°F | Resp 18 | Ht 69.0 in | Wt 169.4 lb

## 2020-07-10 DIAGNOSIS — C8318 Mantle cell lymphoma, lymph nodes of multiple sites: Secondary | ICD-10-CM

## 2020-07-10 DIAGNOSIS — D696 Thrombocytopenia, unspecified: Secondary | ICD-10-CM | POA: Diagnosis not present

## 2020-07-10 DIAGNOSIS — C8314 Mantle cell lymphoma, lymph nodes of axilla and upper limb: Secondary | ICD-10-CM | POA: Diagnosis not present

## 2020-07-10 DIAGNOSIS — R1012 Left upper quadrant pain: Secondary | ICD-10-CM | POA: Diagnosis not present

## 2020-07-10 DIAGNOSIS — Z5112 Encounter for antineoplastic immunotherapy: Secondary | ICD-10-CM | POA: Diagnosis not present

## 2020-07-10 DIAGNOSIS — Z7189 Other specified counseling: Secondary | ICD-10-CM

## 2020-07-10 DIAGNOSIS — R21 Rash and other nonspecific skin eruption: Secondary | ICD-10-CM | POA: Diagnosis not present

## 2020-07-10 DIAGNOSIS — R161 Splenomegaly, not elsewhere classified: Secondary | ICD-10-CM | POA: Diagnosis not present

## 2020-07-10 DIAGNOSIS — Z5111 Encounter for antineoplastic chemotherapy: Secondary | ICD-10-CM | POA: Diagnosis not present

## 2020-07-10 DIAGNOSIS — R634 Abnormal weight loss: Secondary | ICD-10-CM | POA: Diagnosis not present

## 2020-07-10 DIAGNOSIS — R7402 Elevation of levels of lactic acid dehydrogenase (LDH): Secondary | ICD-10-CM | POA: Diagnosis not present

## 2020-07-10 LAB — CBC WITH DIFFERENTIAL (CANCER CENTER ONLY)
Abs Immature Granulocytes: 0.01 10*3/uL (ref 0.00–0.07)
Basophils Absolute: 0 10*3/uL (ref 0.0–0.1)
Basophils Relative: 0 %
Eosinophils Absolute: 0 10*3/uL (ref 0.0–0.5)
Eosinophils Relative: 1 %
HCT: 36.8 % — ABNORMAL LOW (ref 39.0–52.0)
Hemoglobin: 12.5 g/dL — ABNORMAL LOW (ref 13.0–17.0)
Immature Granulocytes: 0 %
Lymphocytes Relative: 8 %
Lymphs Abs: 0.3 10*3/uL — ABNORMAL LOW (ref 0.7–4.0)
MCH: 29.3 pg (ref 26.0–34.0)
MCHC: 34 g/dL (ref 30.0–36.0)
MCV: 86.4 fL (ref 80.0–100.0)
Monocytes Absolute: 0.3 10*3/uL (ref 0.1–1.0)
Monocytes Relative: 7 %
Neutro Abs: 3.6 10*3/uL (ref 1.7–7.7)
Neutrophils Relative %: 84 %
Platelet Count: 163 10*3/uL (ref 150–400)
RBC: 4.26 MIL/uL (ref 4.22–5.81)
RDW: 14 % (ref 11.5–15.5)
WBC Count: 4.3 10*3/uL (ref 4.0–10.5)
nRBC: 0 % (ref 0.0–0.2)

## 2020-07-10 LAB — CMP (CANCER CENTER ONLY)
ALT: 27 U/L (ref 0–44)
AST: 21 U/L (ref 15–41)
Albumin: 3.2 g/dL — ABNORMAL LOW (ref 3.5–5.0)
Alkaline Phosphatase: 70 U/L (ref 38–126)
Anion gap: 7 (ref 5–15)
BUN: 29 mg/dL — ABNORMAL HIGH (ref 8–23)
CO2: 27 mmol/L (ref 22–32)
Calcium: 8.9 mg/dL (ref 8.9–10.3)
Chloride: 104 mmol/L (ref 98–111)
Creatinine: 1.12 mg/dL (ref 0.61–1.24)
GFR, Estimated: >60 mL/min (ref >60)
Glucose, Bld: 91 mg/dL (ref 70–99)
Potassium: 4.2 mmol/L (ref 3.5–5.1)
Sodium: 138 mmol/L (ref 135–145)
Total Bilirubin: 0.5 mg/dL (ref 0.3–1.2)
Total Protein: 6.2 g/dL — ABNORMAL LOW (ref 6.5–8.1)

## 2020-07-10 LAB — HEPATITIS B CORE ANTIBODY, TOTAL: Hep B Core Total Ab: REACTIVE — AB

## 2020-07-10 LAB — URIC ACID: Uric Acid, Serum: 6.2 mg/dL (ref 3.7–8.6)

## 2020-07-10 LAB — HEPATITIS B SURFACE ANTIGEN: Hepatitis B Surface Ag: NONREACTIVE

## 2020-07-10 NOTE — Progress Notes (Signed)
HEMATOLOGY/ONCOLOGY CONSULTATION NOTE  Date of Service: 07/10/2020  Patient Care Team: Brian Arabian, MD as PCP - General (Family Medicine)  CHIEF COMPLAINTS/PURPOSE OF CONSULTATION:  Continue mx of mantle cell lymphoma  HISTORY OF PRESENTING ILLNESS:   Brian Kaiser is a wonderful 64 y.o. male who has been referred to [place] by Dr. Gaynelle Kaiser for evaluation and management of concern for lymphoma. Pt is accompanied today by his wife, Brian Kaiser. The pt reports that he is doing well overall.    The pt reports that last November he had an Upper Endoscopy because he was having abdominal bloating. Pt was found to have gastritis and was given Protonix and Pepcid to use as needed. Pt has not used Protonix and Pepcid in two months. His abdominal bloating has been intermittent since November, but has been better recently.   Last July pt had surgery on his right forearm and was experiencing difficulty sleeping due to him continuously rolling on the arm. This lasted for some time. Pt had his second COVID19 vaccine in February and his second Shingles vaccine two weeks later. After this he began experiencing fatigue, which is still present. His fatigue is not progressive and he is able to complete his daily task. Pt has drenching night sweats that started two months ago. These do not occur every night. He has lost 15 lbs in the last few months without effort, but started back going to the gym in May. Pt has noticed that left inguinal lymph node has fluctuated in size since he was diagnosed with Hepatitis B nearly 20 years ago. Recently the lymph node has grown in size and become more firm. He has also noticed an increase in the size of a right cervical lymph node. Pt has had left testicular discomfort in the last few weeks. He reports b/l ankle swelling, calf cramping and upper left abdominal pain with certain sleeping positions.  His Hepatitis B was picked up after he was experiencing abdominal discomfort  and was treated with milk thistle. It has been monitored via labwork over the years. Pt had a Basal Cell lesion on his left thigh and back a few months ago. They have been removed.   Of note prior to the patient's visit today, pt has had CT C/A/P ([id number]) completed on 06/06/2020 with results revealing "Extensive adenopathy in the chest, abdomen and pelvis. Marked splenomegaly. Findings most concerning for lymphoma. Small amount of free fluid in the abdomen and pelvis."   Most recent lab results (06/03/2020) of CBC w/diff & CMP is as follows: all values are WNL except for PLT at 135K. 06/03/2020 TSH at 7.86 06/03/2020 PSA at 0.20 06/03/2020 Sed Rate at 1  On review of systems, pt reports night sweats, new lumps/bumps, ankle swelling, calf cramping, constipation, change in color of bowel movements, left testicular pain, fatigue, unexpected weight loss, improving abdominal bloating, early satiety and denies SOB, chest pain, fevers, chills, bloody/black stools, headaches, vision changes, bone pain, back pain, flank pain and any other symptoms.   On PMHx the pt reports GERD, Forearm laceration, Right Forearm Ligament Repair, Hepatitis B, Hyperlipidemia. On Family Hx the pt reports that his brother was diagnosed with DLBCL a year ago.  INTERVAL HISTORY: Brian Kaiser is a wonderful 64 y.o. male who is here for evaluation and management of his Mantle Cell Lymphoma. He is here after C1 Bendamustine/Rtiuxan. We are joined today by his wife. The patient's last visit with [place] was on 06/25/2020. The pt reports that he is  doing well overall.  The pt reports that he had some stomach upset after taking Prednisone. This has resolved. Pt has a red, itchy rash that appears in various places (mainly extremities) and typically occurs after physical exertion. These rashes only last about 30 minutes. He denies any rash with treatment. Pt is drinking 3-4 quarts of water per day. He would prefer to continue receiving his  infusions in [facility].   Lab results today (07/10/20) of CBC w/diff and CMP is as follows: all values are WNL except for Hgb at 12.5, HCT at 36.8, Lymphs Abs at 0.3K, BUN at 29, Total Protein at 6.2, Albumin at 3.2. 07/10/2020 Uric acid at 6.2  On review of systems, pt reports rash, fatigue and denies low appetite, abdominal pain, SOB, chest pain, dysuria, constipation, diarrhea, leg swelling, back pain, fevers, chills, drenching night sweats, nausea and any other symptoms.   MEDICAL HISTORY:  Past Medical History:  Diagnosis Date  . Allergy   . Forearm laceration    right forearm  . GERD (gastroesophageal reflux disease)   . Hepatitis B   . Hyperlipidemia   . Pulmonary lesion    Left pulmonary schwannoma  . Schwannoma    left lower lung    SURGICAL HISTORY: Past Surgical History:  Procedure Laterality Date  . COLONOSCOPY    . LIGAMENT REPAIR Right 04/19/2019   Right forearm  . LIPOMA EXCISION Left    neck  . LUNG SURGERY Left   . OTHER SURGICAL HISTORY Left    pulmonary schwannoma excision  . UPPER GASTROINTESTINAL ENDOSCOPY    . WOUND EXPLORATION Right 04/19/2019   Procedure: WOUND EXPLORATION; REPAIR RIGHT FOREARM LACERATION;  Surgeon: Charlotte Crumb, MD;  Location: [facility];  Service: Orthopedics;  Laterality: Right;  AXILLARY BLOCK AND MAC    SOCIAL HISTORY: Social History   Socioeconomic History  . Marital status: Married    Spouse name: Not on file  . Number of children: Not on file  . Years of education: Not on file  . Highest education level: Not on file  Occupational History  . Not on file  Tobacco Use  . Smoking status: Never Smoker  . Smokeless tobacco: Never Used  Vaping Use  . Vaping Use: Never used  Substance and Sexual Activity  . Alcohol use: No    Alcohol/week: 0.0 standard drinks  . Drug use: No  . Sexual activity: Not on file  Other Topics Concern  . Not on file  Social History Narrative  . Not on file   Social  Determinants of Health   Financial Resource Strain:   . Difficulty of Paying Living Expenses: Not on file  Food Insecurity:   . Worried About Charity fundraiser in the Last Year: Not on file  . Ran Out of Food in the Last Year: Not on file  Transportation Needs:   . Lack of Transportation (Medical): Not on file  . Lack of Transportation (Non-Medical): Not on file  Physical Activity:   . Days of Exercise per Week: Not on file  . Minutes of Exercise per Session: Not on file  Stress:   . Feeling of Stress : Not on file  Social Connections:   . Frequency of Communication with Friends and Family: Not on file  . Frequency of Social Gatherings with Friends and Family: Not on file  . Attends Religious Services: Not on file  . Active Member of Clubs or Organizations: Not on file  . Attends  Club or Organization Meetings: Not on file  . Marital Status: Not on file  Intimate Partner Violence:   . Fear of Current or Ex-Partner: Not on file  . Emotionally Abused: Not on file  . Physically Abused: Not on file  . Sexually Abused: Not on file    FAMILY HISTORY: Family History  Problem Relation Age of Onset  . Macular degeneration Mother   . Dementia Mother   . Diverticulitis Mother   . Prostate cancer Paternal Grandfather   . Diverticulitis Brother   . Colon cancer Neg Hx   . Colon polyps Neg Hx   . Esophageal cancer Neg Hx   . Liver cancer Neg Hx   . Pancreatic cancer Neg Hx   . Rectal cancer Neg Hx   . Stomach cancer Neg Hx     ALLERGIES:  is allergic to augmentin [amoxicillin-pot clavulanate].  MEDICATIONS:  Current Outpatient Medications  Medication Sig Dispense Refill  . acetaminophen (TYLENOL) 325 MG tablet Take 325 mg by mouth every 6 (six) hours as needed for moderate pain or headache.    . allopurinol (ZYLOPRIM) 100 MG tablet Take 1 tablet (100 mg total) by mouth 2 (two) times daily. 60 tablet 0  . cholecalciferol (VITAMIN D3) 25 MCG (1000 UNIT) tablet Take 1,000 Units  by mouth daily.    Marland Kitchen dexamethasone (DECADRON) 4 MG tablet Take 2 tablets (8 mg total) by mouth daily. Start the day after bendamustine chemotherapy for 2 days. Take with food. 30 tablet 1  . famotidine (PEPCID) 20 MG tablet Take 1 tablet (20 mg total) by mouth at bedtime. (Patient taking differently: Take 20 mg by mouth daily as needed for heartburn. ) 30 tablet 3  . LORazepam (ATIVAN) 0.5 MG tablet Take 1 tablet (0.5 mg total) by mouth every 8 (eight) hours as needed for anxiety. 30 tablet 0  . Multiple Vitamin (MULTIVITAMIN) tablet Take 1 tablet by mouth daily.    . Omega-3 Fatty Acids (FISH OIL) 1000 MG CAPS Take 1,000 mg by mouth daily.    . ondansetron (ZOFRAN) 8 MG tablet Take 1 tablet (8 mg total) by mouth 2 (two) times daily as needed for refractory nausea / vomiting. Start on day 2 after bendamustine chemo. 30 tablet 1  . pantoprazole (PROTONIX) 20 MG tablet TAKE 1 TABLET BY MOUTH ONCE A DAY (Patient taking differently: Take 20 mg by mouth daily as needed for heartburn. ) 90 tablet 3  . prochlorperazine (COMPAZINE) 10 MG tablet Take 1 tablet (10 mg total) by mouth every 6 (six) hours as needed (Nausea or vomiting). 30 tablet 1  . vitamin C (ASCORBIC ACID) 250 MG tablet Take 250 mg by mouth daily.     No current facility-administered medications for this visit.    REVIEW OF SYSTEMS:   A 10+ POINT REVIEW OF SYSTEMS WAS OBTAINED including neurology, dermatology, psychiatry, cardiac, respiratory, lymph, extremities, GI, GU, Musculoskeletal, constitutional, breasts, reproductive, HEENT.  All pertinent positives are noted in the HPI.  All others are negative.   PHYSICAL EXAMINATION: ECOG PERFORMANCE STATUS: 1 - Symptomatic but completely ambulatory  . Vitals:   07/10/20 1525  BP: 121/76  Pulse: 71  Resp: 18  Temp: (!) 97.5 F (36.4 C)  SpO2: 100%   Filed Weights   07/10/20 1525  Weight: 169 lb 6.4 oz (76.8 kg)   .Body mass index is 25.02 kg/m.   GENERAL:alert, in no acute  distress and comfortable SKIN: no acute rashes, no significant lesions EYES: conjunctiva are  pink and non-injected, sclera anicteric OROPHARYNX: MMM, no exudates, no oropharyngeal erythema or ulceration NECK: supple, no JVD LYMPH: Improvement in right axillary and inguinal lymph nodes. LUNGS: clear to auscultation b/l with normal respiratory effort HEART: regular rate & rhythm ABDOMEN:  normoactive bowel sounds , non tender, not distended. No palpable hepatomegaly. Splenomegaly 2 finger breadths below the costal margin. Extremity: no pedal edema PSYCH: alert & oriented x 3 with fluent speech NEURO: no focal motor/sensory deficits  LABORATORY DATA:  I have reviewed the data as listed  . CBC Latest Ref Rng & Units 07/10/2020 07/04/2020 06/20/2020  WBC 4.0 - 10.5 K/uL 4.3 5.7 5.0  Hemoglobin 13.0 - 17.0 g/dL 12.5(L) 12.5(L) 13.3  Hematocrit 39 - 52 % 36.8(L) 38.1(L) 41.7  Platelets 150 - 400 K/uL 163 142(L) 132(L)    . CMP Latest Ref Rng & Units 07/10/2020 07/04/2020 06/11/2020  Glucose 70 - 99 mg/dL 91 65(L) 82  BUN 8 - 23 mg/dL 29(H) 26(H) 15  Creatinine 0.61 - 1.24 mg/dL 1.12 1.15 1.10  Sodium 135 - 145 mmol/L 138 139 140  Potassium 3.5 - 5.1 mmol/L 4.2 3.7 4.1  Chloride 98 - 111 mmol/L 104 103 107  CO2 22 - 32 mmol/L _0 Calcium 8.9 - 10.3 mg/dL 8.9 9.5 9.1  Total Protein 6.5 - 8.1 g/dL 6.2(L) 6.1(L) 6.5  Total Bilirubin 0.3 - 1.2 mg/dL 0.5 0.5 0.7  Alkaline Phos 38 - 126 U/L 70 75 73  AST 15 - 41 U/L 21 35 41  ALT 0 - 44 U/L 27 44 23   06/20/2020 Right Axillary Surgical Pathology Report [id number]):    06/20/2020 Right Axillary Lymph Node Flow Pathology ([id number]):   06/11/2020 Flow Pathology ([id number]):   RADIOGRAPHIC STUDIES: I have personally reviewed the radiological images as listed and agreed with the findings in the report. NM PET Image Initial (PI) Skull Base To Thigh  Result Date: 06/19/2020 CLINICAL DATA:  Initial treatment strategy  for non-Hodgkin's lymphoma. EXAM: NUCLEAR MEDICINE PET SKULL BASE TO THIGH TECHNIQUE: 10.1 mCi F-18 FDG was injected intravenously. Full-ring PET imaging was performed from the skull base to thigh after the radiotracer. CT data was obtained and used for attenuation correction and anatomic localization. Fasting blood glucose: 76 mg/dl COMPARISON:  CT 06/06/2020 FINDINGS: Mediastinal blood pool activity: SUV max 1.5 Liver activity: SUV max 3.1 NECK: Multiple small bilateral hypermetabolic cervical lymph nodes. Example RIGHT level 2 lymph node with SUV max equal 8.1. This is one of the larger lymph nodes at 1.5 cm short axis. Incidental CT findings: None CHEST: Bilateral hypermetabolic axillary adenopathy. Example LEFT axial lymph node measures 12 mm short axis with SUV max equal 7.7. Prevascular, internal mammary and mediastinal nodal metastasis. Example subcarinal lymph node measures 20 mm short axis with SUV max 8.4. Anterior mediastinum/internal mammary node on the LEFT measuring 16 mm with SUV max equal 13.2. Hypermetabolic adenopathy in the precordial space. For example 20 mm a nodule anterior to the RIGHT hepatic lobe with SUV max equal 7.2. Incidental CT findings: No pulmonary nodules ABDOMEN/PELVIS: Spleen is massively enlarged measuring 10.8 by 17.6 x 28 cm (volume = 2800 cm^3). Spleen is uniformly hypermetabolic with SUV max equal 6.3. Enlarged hypermetabolic periportal and gastrohepatic ligament lymph nodes. Example periportal node measuring 3.2 cm with SUV max equal 8.3. Hypermetabolic adenopathy extends the retroperitoneal periaortic nodal stations. Adenopathy extends along the iliac chains into the inguinal region. Example LEFT external iliac node along the operator space measuring 16 mm short  axis with SUV max equal 10.3. No hypermetabolic lesion within the liver. Kidneys are normal. Bowel appears normal. Incidental CT findings: none SKELETON: No abnormal activity within the marrow space. Incidental CT  findings: none IMPRESSION: 1. Widespread intensely hypermetabolic adenopathy involving the cervical neck lymph nodes, mediastinal nodes, axillary nodes, precordial nodes, periportal nodes, periaortic retroperitoneal nodes, iliac nodes and inguinal nodes. 2. Massively enlarged hypermetabolic spleen consistent with lymphoma. 3. No pulmonary or liver involvement. 4. No evidence of lymphoma in the marrow space. Electronically Signed   By: Suzy Bouchard M.D.   On: 06/19/2020 13:58   [place] CORE BIOPSY (LYMPH NODES)  Result Date: 06/20/2020 INDICATION: 64 year old male referred for biopsy of lymph node, possible lymphoma EXAM: ULTRASOUND-GUIDED LYMPH NODE BIOPSY MEDICATIONS: None ANESTHESIA/SEDATION: Moderate (conscious) sedation was employed during this procedure. A total of Versed mg and Fentanyl mcg was administered intravenously. Moderate Sedation Time: minutes. The patient's level of consciousness and vital signs were monitored continuously by radiology nursing throughout the procedure under my direct supervision. FLUOROSCOPY TIME:  Fluoroscopy Time:  minutes  seconds ( mGy). COMPLICATIONS: None immediate. PROCEDURE: Informed written consent was obtained from the patient after a thorough discussion of the procedural risks, benefits and alternatives. All questions were addressed. Maximal Sterile Barrier Technique was utilized including caps, mask, sterile gowns, sterile gloves, sterile drape, hand hygiene and skin antiseptic. A timeout was performed prior to the initiation of the procedure. Ultrasound survey was performed with images stored and sent to PACs. The right axillary region was prepped with chlorhexidine in a sterile fashion, and a sterile drape was applied covering the operative field. A sterile gown and sterile gloves were used for the procedure. Local anesthesia was provided with 1% Lidocaine. Ultrasound guidance was used to infiltrate the region with 1% lidocaine for local anesthesia. Small stab  incision was made with 11 blade scalpel. Multiple 16 gauge core biopsy were then acquired of the right axillary lymph node using ultrasound guidance. Images were stored. Tissue placed into saline for transport. Final image was stored after biopsy. Patient tolerated the procedure well and remained hemodynamically stable throughout. No complications were encountered and no significant blood loss was encounter IMPRESSION: Status post ultrasound-guided biopsy of right axillary lymph node Signed, Dulcy Fanny. Dellia Nims, RPVI [facility] Radiology Electronically Signed   By: Corrie Mckusick D.O.   On: 06/20/2020 14:33    ASSESSMENT & PLAN:   64 yo with   1) newly diagnosed stage IV mantle cell lymphoma with extensive lymphadenopathy and massive splenomegaly . 2) massive splenomegaly likely related to mantle cell lymphoma  3) thrombocytopenia-mild platelets of 132k likely related to lymphoma. 4) hepatitis B core antibody positive, surface antigen negative, HBV DNA PCR negative 5) elevated LDH due to lymphoma PLAN: -Discussed pt labwork today, 07/10/20; blood counts are holding well, BUN is elevated, other chemistries look good, Uric acid is WNL. -The pt has no prohibitive toxicities from continuing Bendamustine + Rituxan at this time. -Advised pt that Allopurinol carries a high risk of causing rashes. Discontinue immediately. -Recommended that the pt continue to eat well, drink at least 48-64 oz of water each day, and walk 20-30 minutes each day.   -Recommend pt receive the annual flu vaccine. Wait two weeks between vaccination if possible. -Discussed CDC guidelines regarding the Jackson booster. Will give after C2.  -Continue to recommend crowd avoidance.  -Will see back in three weeks with labs   FOLLOW UP: -F/u with Dr Irene Limbo with labs on 07/31/2020 -  Plz schedule C2 of BR D1 on 11/11 and D2 on 11/12 (at [place]- per patients preference).   The  total time spent in the appt was 30 minutes and more than 50% was on counseling and direct patient cares.  All of the patient's questions were answered with apparent satisfaction. The patient knows to call the clinic with any problems, questions or concerns.    Sullivan Lone MD [place] AAHIVMS [facility] Hematology/Oncology Physician [facility]  (Office):       [phone] (Work cell):  [phone] (Fax):           [phone]  07/10/2020 4:18 PM  I, Yevette Edwards, am acting as a scribe for Dr. Sullivan Lone.   .I have reviewed the above documentation for accuracy and completeness, and I agree with the above. Brunetta Genera MD

## 2020-07-17 ENCOUNTER — Telehealth: Payer: Self-pay | Admitting: *Deleted

## 2020-07-17 NOTE — Telephone Encounter (Signed)
Faxed signed Flu Vaccine Accomodation Request signed by Dr. Irene Limbo (advises patient to wait to have vaccine until after 08/05/20) to [facility] @ [phone]. Fax confirmation received.

## 2020-07-25 ENCOUNTER — Other Ambulatory Visit: Payer: Self-pay | Admitting: *Deleted

## 2020-07-30 NOTE — Progress Notes (Signed)
HEMATOLOGY/ONCOLOGY CONSULTATION NOTE  Date of Service: 07/31/2020  Patient Care Team: Brian Arabian, MD as PCP - General (Family Medicine)  CHIEF COMPLAINTS/PURPOSE OF CONSULTATION:  Continue mx of mantle cell lymphoma  HISTORY OF PRESENTING ILLNESS:   Brian Kaiser is a wonderful 64 y.o. male who has been referred to [place] by Dr. Gaynelle Kaiser for evaluation and management of concern for lymphoma. Pt is accompanied today by his wife, Brian Kaiser. The pt reports that he is doing well overall.    The pt reports that last November he had an Upper Endoscopy because he was having abdominal bloating. Pt was found to have gastritis and was given Protonix and Pepcid to use as needed. Pt has not used Protonix and Pepcid in two months. His abdominal bloating has been intermittent since November, but has been better recently.   Last July pt had surgery on his right forearm and was experiencing difficulty sleeping due to him continuously rolling on the arm. This lasted for some time. Pt had his second COVID19 vaccine in February and his second Shingles vaccine two weeks later. After this he began experiencing fatigue, which is still present. His fatigue is not progressive and he is able to complete his daily task. Pt has drenching night sweats that started two months ago. These do not occur every night. He has lost 15 lbs in the last few months without effort, but started back going to the gym in May. Pt has noticed that left inguinal lymph node has fluctuated in size since he was diagnosed with Hepatitis B nearly 20 years ago. Recently the lymph node has grown in size and become more firm. He has also noticed an increase in the size of a right cervical lymph node. Pt has had left testicular discomfort in the last few weeks. He reports b/l ankle swelling, calf cramping and upper left abdominal pain with certain sleeping positions.  His Hepatitis B was picked up after he was experiencing abdominal discomfort  and was treated with milk thistle. It has been monitored via labwork over the years. Pt had a Basal Cell lesion on his left thigh and back a few months ago. They have been removed.   Of note prior to the patient's visit today, pt has had CT C/A/P ([id number]) completed on 06/06/2020 with results revealing "Extensive adenopathy in the chest, abdomen and pelvis. Marked splenomegaly. Findings most concerning for lymphoma. Small amount of free fluid in the abdomen and pelvis."   Most recent lab results (06/03/2020) of CBC w/diff & CMP is as follows: all values are WNL except for PLT at 135K. 06/03/2020 TSH at 7.86 06/03/2020 PSA at 0.20 06/03/2020 Sed Rate at 1  On review of systems, pt reports night sweats, new lumps/bumps, ankle swelling, calf cramping, constipation, change in color of bowel movements, left testicular pain, fatigue, unexpected weight loss, improving abdominal bloating, early satiety and denies SOB, chest pain, fevers, chills, bloody/black stools, headaches, vision changes, bone pain, back pain, flank pain and any other symptoms.   On PMHx the pt reports GERD, Forearm laceration, Right Forearm Ligament Repair, Hepatitis B, Hyperlipidemia. On Family Hx the pt reports that his brother was diagnosed with DLBCL a year ago.  INTERVAL HISTORY: Brian Kaiser is a wonderful 64 y.o. male who is here for evaluation and management of his Mantle Cell Lymphoma. He is here prior to C2 Bendamustine/Rituxan. The patient's last visit with [place] was on 07/10/2020. The pt reports that he is doing well overall.  The pt  reports that he is feeling well and denies any new symptoms or concerns. The pain in his abdomen has resolved. He is taking 3000 IU Vitamin D daily.   Lab results today (07/31/20) of CBC w/diff and CMP is as follows: all values are WNL except for HCT at 38.5, Total Protein at 6.4, Albumin at 3.4.  On review of systems, pt denies fevers, chills, night sweats, unexpected weight loss,  abdominal pain, constipation, leg swelling and any other symptoms.   MEDICAL HISTORY:  Past Medical History:  Diagnosis Date  . Allergy   . Forearm laceration    right forearm  . GERD (gastroesophageal reflux disease)   . Hepatitis B   . Hyperlipidemia   . Pulmonary lesion    Left pulmonary schwannoma  . Schwannoma    left lower lung    SURGICAL HISTORY: Past Surgical History:  Procedure Laterality Date  . COLONOSCOPY    . LIGAMENT REPAIR Right 04/19/2019   Right forearm  . LIPOMA EXCISION Left    neck  . LUNG SURGERY Left   . OTHER SURGICAL HISTORY Left    pulmonary schwannoma excision  . UPPER GASTROINTESTINAL ENDOSCOPY    . WOUND EXPLORATION Right 04/19/2019   Procedure: WOUND EXPLORATION; REPAIR RIGHT FOREARM LACERATION;  Surgeon: Charlotte Crumb, MD;  Location: [facility];  Service: Orthopedics;  Laterality: Right;  AXILLARY BLOCK AND MAC    SOCIAL HISTORY: Social History   Socioeconomic History  . Marital status: Married    Spouse name: Not on file  . Number of children: Not on file  . Years of education: Not on file  . Highest education level: Not on file  Occupational History  . Not on file  Tobacco Use  . Smoking status: Never Smoker  . Smokeless tobacco: Never Used  Vaping Use  . Vaping Use: Never used  Substance and Sexual Activity  . Alcohol use: No    Alcohol/week: 0.0 standard drinks  . Drug use: No  . Sexual activity: Not on file  Other Topics Concern  . Not on file  Social History Narrative  . Not on file   Social Determinants of Health   Financial Resource Strain:   . Difficulty of Paying Living Expenses: Not on file  Food Insecurity:   . Worried About Charity fundraiser in the Last Year: Not on file  . Ran Out of Food in the Last Year: Not on file  Transportation Needs:   . Lack of Transportation (Medical): Not on file  . Lack of Transportation (Non-Medical): Not on file  Physical Activity:   . Days of Exercise  per Week: Not on file  . Minutes of Exercise per Session: Not on file  Stress:   . Feeling of Stress : Not on file  Social Connections:   . Frequency of Communication with Friends and Family: Not on file  . Frequency of Social Gatherings with Friends and Family: Not on file  . Attends Religious Services: Not on file  . Active Member of Clubs or Organizations: Not on file  . Attends Archivist Meetings: Not on file  . Marital Status: Not on file  Intimate Partner Violence:   . Fear of Current or Ex-Partner: Not on file  . Emotionally Abused: Not on file  . Physically Abused: Not on file  . Sexually Abused: Not on file    FAMILY HISTORY: Family History  Problem Relation Age of Onset  . Macular degeneration Mother   .  Dementia Mother   . Diverticulitis Mother   . Prostate cancer Paternal Grandfather   . Diverticulitis Brother   . Colon cancer Neg Hx   . Colon polyps Neg Hx   . Esophageal cancer Neg Hx   . Liver cancer Neg Hx   . Pancreatic cancer Neg Hx   . Rectal cancer Neg Hx   . Stomach cancer Neg Hx     ALLERGIES:  is allergic to augmentin [amoxicillin-pot clavulanate].  MEDICATIONS:  Current Outpatient Medications  Medication Sig Dispense Refill  . acetaminophen (TYLENOL) 325 MG tablet Take 325 mg by mouth every 6 (six) hours as needed for moderate pain or headache.    . allopurinol (ZYLOPRIM) 100 MG tablet Take 1 tablet (100 mg total) by mouth 2 (two) times daily. 60 tablet 0  . cholecalciferol (VITAMIN D3) 25 MCG (1000 UNIT) tablet Take 1,000 Units by mouth daily.    Marland Kitchen dexamethasone (DECADRON) 4 MG tablet Take 2 tablets (8 mg total) by mouth daily. Start the day after bendamustine chemotherapy for 2 days. Take with food. 30 tablet 1  . famotidine (PEPCID) 20 MG tablet Take 1 tablet (20 mg total) by mouth at bedtime. (Patient taking differently: Take 20 mg by mouth daily as needed for heartburn. ) 30 tablet 3  . LORazepam (ATIVAN) 0.5 MG tablet Take 1 tablet  (0.5 mg total) by mouth every 8 (eight) hours as needed for anxiety. 30 tablet 0  . Multiple Vitamin (MULTIVITAMIN) tablet Take 1 tablet by mouth daily.    . Omega-3 Fatty Acids (FISH OIL) 1000 MG CAPS Take 1,000 mg by mouth daily.    . ondansetron (ZOFRAN) 8 MG tablet Take 1 tablet (8 mg total) by mouth 2 (two) times daily as needed for refractory nausea / vomiting. Start on day 2 after bendamustine chemo. 30 tablet 1  . pantoprazole (PROTONIX) 20 MG tablet TAKE 1 TABLET BY MOUTH ONCE A DAY (Patient taking differently: Take 20 mg by mouth daily as needed for heartburn. ) 90 tablet 3  . prochlorperazine (COMPAZINE) 10 MG tablet Take 1 tablet (10 mg total) by mouth every 6 (six) hours as needed (Nausea or vomiting). 30 tablet 1  . vitamin C (ASCORBIC ACID) 250 MG tablet Take 250 mg by mouth daily.     No current facility-administered medications for this visit.    REVIEW OF SYSTEMS:   A 10+ POINT REVIEW OF SYSTEMS WAS OBTAINED including neurology, dermatology, psychiatry, cardiac, respiratory, lymph, extremities, GI, GU, Musculoskeletal, constitutional, breasts, reproductive, HEENT.  All pertinent positives are noted in the HPI.  All others are negative.   PHYSICAL EXAMINATION: ECOG PERFORMANCE STATUS: 1 - Symptomatic but completely ambulatory  . Vitals:   07/31/20 1023  BP: (!) 142/98  Pulse: 68  Resp: 18  Temp: (!) 97.1 F (36.2 C)  SpO2: 100%   Filed Weights   07/31/20 1023  Weight: 173 lb 4.8 oz (78.6 kg)   .Body mass index is 25.59 kg/m.   GENERAL:alert, in no acute distress and comfortable SKIN: no acute rashes, no significant lesions EYES: conjunctiva are pink and non-injected, sclera anicteric OROPHARYNX: MMM, no exudates, no oropharyngeal erythema or ulceration NECK: supple, no JVD LYMPH:  no palpable lymphadenopathy in the cervical, axillary or inguinal regions LUNGS: clear to auscultation b/l with normal respiratory effort HEART: regular rate & rhythm ABDOMEN:   normoactive bowel sounds , non tender, not distended. No palpable hepatosplenomegaly.  Extremity: no pedal edema PSYCH: alert & oriented x 3 with fluent  speech NEURO: no focal motor/sensory deficits  LABORATORY DATA:  I have reviewed the data as listed  . CBC Latest Ref Rng & Units 07/31/2020 07/10/2020 07/04/2020  WBC 4.0 - 10.5 K/uL 4.6 4.3 5.7  Hemoglobin 13.0 - 17.0 g/dL 13.1 12.5(L) 12.5(L)  Hematocrit 39 - 52 % 38.5(L) 36.8(L) 38.1(L)  Platelets 150 - 400 K/uL 154 163 142(L)    . CMP Latest Ref Rng & Units 07/31/2020 07/10/2020 07/04/2020  Glucose 70 - 99 mg/dL 81 91 65(L)  BUN 8 - 23 mg/dL 16 29(H) 26(H)  Creatinine 0.61 - 1.24 mg/dL 1.08 1.12 1.15  Sodium 135 - 145 mmol/L 144 138 139  Potassium 3.5 - 5.1 mmol/L 4.2 4.2 3.7  Chloride 98 - 111 mmol/L 108 104 103  CO2 22 - 32 mmol/L _0 Calcium 8.9 - 10.3 mg/dL 9.1 8.9 9.5  Total Protein 6.5 - 8.1 g/dL 6.4(L) 6.2(L) 6.1(L)  Total Bilirubin 0.3 - 1.2 mg/dL 0.6 0.5 0.5  Alkaline Phos 38 - 126 U/L 53 70 75  AST 15 - 41 U/L 22 21 35  ALT 0 - 44 U/L 16 27 44   06/20/2020 Right Axillary Surgical Pathology Report [id number]):    06/20/2020 Right Axillary Lymph Node Flow Pathology ([id number]):   06/11/2020 Flow Pathology ([id number]):   RADIOGRAPHIC STUDIES: I have personally reviewed the radiological images as listed and agreed with the findings in the report. No results found.  ASSESSMENT & PLAN:   64 yo with   1) newly diagnosed stage IV mantle cell lymphoma with extensive lymphadenopathy and massive splenomegaly . 2) massive splenomegaly likely related to mantle cell lymphoma  3) thrombocytopenia-mild platelets of 132k likely related to lymphoma. 4) hepatitis B core antibody positive, surface antigen negative, HBV DNA PCR negative 5) elevated LDH due to lymphoma PLAN: -Discussed pt labwork today, 07/31/20; blood counts and chemistries are holding steady.  -The pt has no prohibitive toxicities  from continuing C2 Bendamustine + Rituxan tomorrow. -Discussed CDC guidelines regarding the [employer] booster. Recommend pt receive 7-10 days prior to C3. -Recommend pt receive the annual flu vaccine. Wait two weeks between vaccinations if possible.  -Continue daily Vitamin D -Plan to repeat PET/CT after C4 -Will see back in 4 weeks with labs   FOLLOW UP: Plz schedule C3 of BR (D1 and D2) in 4 weeks as per orders (at [facility] per patient preference) Labs and MD visit 1-2 days prior to C3 of BR   The total time spent in the appt was 30 minutes and more than 50% was on counseling and direct patient cares, ordering , co-ordinating and management of chemo-immunotherapy.  All of the patient's questions were answered with apparent satisfaction. The patient knows to call the clinic with any problems, questions or concerns.    Sullivan Lone MD [facility] AAHIVMS [facility] Hematology/Oncology Physician [place]  (Office):       [phone] (Work cell):  [phone] (Fax):           [phone]  07/31/2020 11:05 AM  I, Yevette Edwards, am acting as a scribe for Dr. Sullivan Lone.   .I have reviewed the above documentation for accuracy and completeness, and I agree with the above. Brunetta Genera MD

## 2020-07-31 ENCOUNTER — Inpatient Hospital Stay (HOSPITAL_BASED_OUTPATIENT_CLINIC_OR_DEPARTMENT_OTHER): Payer: 59 | Admitting: Hematology

## 2020-07-31 ENCOUNTER — Inpatient Hospital Stay: Payer: 59 | Attending: Hematology

## 2020-07-31 ENCOUNTER — Other Ambulatory Visit: Payer: Self-pay

## 2020-07-31 VITALS — BP 142/98 | HR 68 | Temp 97.1°F | Resp 18 | Ht 69.0 in | Wt 173.3 lb

## 2020-07-31 DIAGNOSIS — R161 Splenomegaly, not elsewhere classified: Secondary | ICD-10-CM | POA: Diagnosis not present

## 2020-07-31 DIAGNOSIS — Z5112 Encounter for antineoplastic immunotherapy: Secondary | ICD-10-CM | POA: Diagnosis not present

## 2020-07-31 DIAGNOSIS — R7989 Other specified abnormal findings of blood chemistry: Secondary | ICD-10-CM | POA: Insufficient documentation

## 2020-07-31 DIAGNOSIS — R7402 Elevation of levels of lactic acid dehydrogenase (LDH): Secondary | ICD-10-CM | POA: Diagnosis not present

## 2020-07-31 DIAGNOSIS — Z79899 Other long term (current) drug therapy: Secondary | ICD-10-CM | POA: Diagnosis not present

## 2020-07-31 DIAGNOSIS — R768 Other specified abnormal immunological findings in serum: Secondary | ICD-10-CM | POA: Insufficient documentation

## 2020-07-31 DIAGNOSIS — D696 Thrombocytopenia, unspecified: Secondary | ICD-10-CM | POA: Insufficient documentation

## 2020-07-31 DIAGNOSIS — C8314 Mantle cell lymphoma, lymph nodes of axilla and upper limb: Secondary | ICD-10-CM | POA: Insufficient documentation

## 2020-07-31 DIAGNOSIS — Z5111 Encounter for antineoplastic chemotherapy: Secondary | ICD-10-CM | POA: Insufficient documentation

## 2020-07-31 DIAGNOSIS — C8318 Mantle cell lymphoma, lymph nodes of multiple sites: Secondary | ICD-10-CM

## 2020-07-31 DIAGNOSIS — Z7189 Other specified counseling: Secondary | ICD-10-CM

## 2020-07-31 LAB — CBC WITH DIFFERENTIAL (CANCER CENTER ONLY)
Abs Immature Granulocytes: 0.01 10*3/uL (ref 0.00–0.07)
Basophils Absolute: 0.1 10*3/uL (ref 0.0–0.1)
Basophils Relative: 2 %
Eosinophils Absolute: 0.2 10*3/uL (ref 0.0–0.5)
Eosinophils Relative: 5 %
HCT: 38.5 % — ABNORMAL LOW (ref 39.0–52.0)
Hemoglobin: 13.1 g/dL (ref 13.0–17.0)
Immature Granulocytes: 0 %
Lymphocytes Relative: 26 %
Lymphs Abs: 1.2 10*3/uL (ref 0.7–4.0)
MCH: 30.4 pg (ref 26.0–34.0)
MCHC: 34 g/dL (ref 30.0–36.0)
MCV: 89.3 fL (ref 80.0–100.0)
Monocytes Absolute: 0.5 10*3/uL (ref 0.1–1.0)
Monocytes Relative: 10 %
Neutro Abs: 2.6 10*3/uL (ref 1.7–7.7)
Neutrophils Relative %: 57 %
Platelet Count: 154 10*3/uL (ref 150–400)
RBC: 4.31 MIL/uL (ref 4.22–5.81)
RDW: 14.3 % (ref 11.5–15.5)
WBC Count: 4.6 10*3/uL (ref 4.0–10.5)
nRBC: 0 % (ref 0.0–0.2)

## 2020-07-31 LAB — CMP (CANCER CENTER ONLY)
ALT: 16 U/L (ref 0–44)
AST: 22 U/L (ref 15–41)
Albumin: 3.4 g/dL — ABNORMAL LOW (ref 3.5–5.0)
Alkaline Phosphatase: 53 U/L (ref 38–126)
Anion gap: 7 (ref 5–15)
BUN: 16 mg/dL (ref 8–23)
CO2: 29 mmol/L (ref 22–32)
Calcium: 9.1 mg/dL (ref 8.9–10.3)
Chloride: 108 mmol/L (ref 98–111)
Creatinine: 1.08 mg/dL (ref 0.61–1.24)
GFR, Estimated: >60 mL/min (ref >60)
Glucose, Bld: 81 mg/dL (ref 70–99)
Potassium: 4.2 mmol/L (ref 3.5–5.1)
Sodium: 144 mmol/L (ref 135–145)
Total Bilirubin: 0.6 mg/dL (ref 0.3–1.2)
Total Protein: 6.4 g/dL — ABNORMAL LOW (ref 6.5–8.1)

## 2020-08-01 ENCOUNTER — Ambulatory Visit: Payer: 59

## 2020-08-01 ENCOUNTER — Inpatient Hospital Stay: Payer: 59

## 2020-08-01 ENCOUNTER — Other Ambulatory Visit: Payer: 59

## 2020-08-01 ENCOUNTER — Ambulatory Visit: Payer: 59 | Admitting: Hematology

## 2020-08-01 VITALS — BP 131/83 | HR 66 | Temp 98.3°F | Resp 18

## 2020-08-01 DIAGNOSIS — C8318 Mantle cell lymphoma, lymph nodes of multiple sites: Secondary | ICD-10-CM

## 2020-08-01 DIAGNOSIS — D696 Thrombocytopenia, unspecified: Secondary | ICD-10-CM | POA: Diagnosis not present

## 2020-08-01 DIAGNOSIS — Z79899 Other long term (current) drug therapy: Secondary | ICD-10-CM | POA: Diagnosis not present

## 2020-08-01 DIAGNOSIS — Z5111 Encounter for antineoplastic chemotherapy: Secondary | ICD-10-CM | POA: Diagnosis not present

## 2020-08-01 DIAGNOSIS — C8314 Mantle cell lymphoma, lymph nodes of axilla and upper limb: Secondary | ICD-10-CM | POA: Diagnosis not present

## 2020-08-01 DIAGNOSIS — R7402 Elevation of levels of lactic acid dehydrogenase (LDH): Secondary | ICD-10-CM | POA: Diagnosis not present

## 2020-08-01 DIAGNOSIS — R768 Other specified abnormal immunological findings in serum: Secondary | ICD-10-CM | POA: Diagnosis not present

## 2020-08-01 DIAGNOSIS — R7989 Other specified abnormal findings of blood chemistry: Secondary | ICD-10-CM | POA: Diagnosis not present

## 2020-08-01 DIAGNOSIS — Z5112 Encounter for antineoplastic immunotherapy: Secondary | ICD-10-CM | POA: Diagnosis not present

## 2020-08-01 DIAGNOSIS — R161 Splenomegaly, not elsewhere classified: Secondary | ICD-10-CM | POA: Diagnosis not present

## 2020-08-01 DIAGNOSIS — Z7189 Other specified counseling: Secondary | ICD-10-CM

## 2020-08-01 MED ORDER — ACETAMINOPHEN 325 MG PO TABS
650.0000 mg | ORAL_TABLET | Freq: Once | ORAL | Status: AC
  Administered 2020-08-01: 650 mg via ORAL

## 2020-08-01 MED ORDER — PALONOSETRON HCL INJECTION 0.25 MG/5ML
INTRAVENOUS | Status: AC
  Filled 2020-08-01: qty 5

## 2020-08-01 MED ORDER — PALONOSETRON HCL INJECTION 0.25 MG/5ML
0.2500 mg | Freq: Once | INTRAVENOUS | Status: AC
  Administered 2020-08-01: 0.25 mg via INTRAVENOUS

## 2020-08-01 MED ORDER — ACETAMINOPHEN 325 MG PO TABS
ORAL_TABLET | ORAL | Status: AC
  Filled 2020-08-01: qty 2

## 2020-08-01 MED ORDER — SODIUM CHLORIDE 0.9 % IV SOLN
90.0000 mg/m2 | Freq: Once | INTRAVENOUS | Status: AC
  Administered 2020-08-01: 175 mg via INTRAVENOUS
  Filled 2020-08-01: qty 7

## 2020-08-01 MED ORDER — SODIUM CHLORIDE 0.9 % IV SOLN
375.0000 mg/m2 | Freq: Once | INTRAVENOUS | Status: AC
  Administered 2020-08-01: 700 mg via INTRAVENOUS
  Filled 2020-08-01: qty 20

## 2020-08-01 MED ORDER — FAMOTIDINE IN NACL 20-0.9 MG/50ML-% IV SOLN
20.0000 mg | Freq: Once | INTRAVENOUS | Status: AC
  Administered 2020-08-01: 20 mg via INTRAVENOUS

## 2020-08-01 MED ORDER — FAMOTIDINE IN NACL 20-0.9 MG/50ML-% IV SOLN
INTRAVENOUS | Status: AC
  Filled 2020-08-01: qty 50

## 2020-08-01 MED ORDER — SODIUM CHLORIDE 0.9 % IV SOLN
Freq: Once | INTRAVENOUS | Status: AC
  Filled 2020-08-01: qty 250

## 2020-08-01 MED ORDER — DIPHENHYDRAMINE HCL 25 MG PO CAPS
ORAL_CAPSULE | ORAL | Status: AC
  Filled 2020-08-01: qty 2

## 2020-08-01 MED ORDER — SODIUM CHLORIDE 0.9 % IV SOLN
10.0000 mg | Freq: Once | INTRAVENOUS | Status: AC
  Administered 2020-08-01: 10 mg via INTRAVENOUS
  Filled 2020-08-01: qty 10

## 2020-08-01 MED ORDER — DIPHENHYDRAMINE HCL 25 MG PO CAPS
50.0000 mg | ORAL_CAPSULE | Freq: Once | ORAL | Status: AC
  Administered 2020-08-01: 50 mg via ORAL

## 2020-08-01 NOTE — Patient Instructions (Signed)
Rituximab injection What is this medicine? RITUXIMAB (ri TUX i mab) is a monoclonal antibody. It is used to treat certain types of cancer like non-Hodgkin lymphoma and chronic lymphocytic leukemia. It is also used to treat rheumatoid arthritis, granulomatosis with polyangiitis (or Wegener's granulomatosis), microscopic polyangiitis, and pemphigus vulgaris. This medicine may be used for other purposes; ask your health care provider or pharmacist if you have questions. COMMON BRAND NAME(S): Rituxan, RUXIENCE What should I tell my health care provider before I take this medicine? They need to know if you have any of these conditions:  heart disease  infection (especially a virus infection such as hepatitis B, chickenpox, cold sores, or herpes)  immune system problems  irregular heartbeat  kidney disease  low blood counts, like low white cell, platelet, or red cell counts  lung or breathing disease, like asthma  recently received or scheduled to receive a vaccine  an unusual or allergic reaction to rituximab, other medicines, foods, dyes, or preservatives  pregnant or trying to get pregnant  breast-feeding How should I use this medicine? This medicine is for infusion into a vein. It is administered in a hospital or clinic by a specially trained health care professional. A special MedGuide will be given to you by the pharmacist with each prescription and refill. Be sure to read this information carefully each time. Talk to your pediatrician regarding the use of this medicine in children. This medicine is not approved for use in children. Overdosage: If you think you have taken too much of this medicine contact a poison control center or emergency room at once. NOTE: This medicine is only for you. Do not share this medicine with others. What if I miss a dose? It is important not to miss a dose. Call your doctor or health care professional if you are unable to keep an appointment. What  may interact with this medicine?  cisplatin  live virus vaccines This list may not describe all possible interactions. Give your health care provider a list of all the medicines, herbs, non-prescription drugs, or dietary supplements you use. Also tell them if you smoke, drink alcohol, or use illegal drugs. Some items may interact with your medicine. What should I watch for while using this medicine? Your condition will be monitored carefully while you are receiving this medicine. You may need blood work done while you are taking this medicine. This medicine can cause serious allergic reactions. To reduce your risk you may need to take medicine before treatment with this medicine. Take your medicine as directed. In some patients, this medicine may cause a serious brain infection that may cause death. If you have any problems seeing, thinking, speaking, walking, or standing, tell your healthcare professional right away. If you cannot reach your healthcare professional, urgently seek other source of medical care. Call your doctor or health care professional for advice if you get a fever, chills or sore throat, or other symptoms of a cold or flu. Do not treat yourself. This drug decreases your body's ability to fight infections. Try to avoid being around people who are sick. Do not become pregnant while taking this medicine or for at least 12 months after stopping it. Women should inform their doctor if they wish to become pregnant or think they might be pregnant. There is a potential for serious side effects to an unborn child. Talk to your health care professional or pharmacist for more information. Do not breast-feed an infant while taking this medicine or for at   least 6 months after stopping it. What side effects may I notice from receiving this medicine? Side effects that you should report to your doctor or health care professional as soon as possible:  allergic reactions like skin rash, itching or  hives; swelling of the face, lips, or tongue  breathing problems  chest pain  changes in vision  diarrhea  headache with fever, neck stiffness, sensitivity to light, nausea, or confusion  fast, irregular heartbeat  loss of memory  low blood counts - this medicine may decrease the number of white blood cells, red blood cells and platelets. You may be at increased risk for infections and bleeding.  mouth sores  problems with balance, talking, or walking  redness, blistering, peeling or loosening of the skin, including inside the mouth  signs of infection - fever or chills, cough, sore throat, pain or difficulty passing urine  signs and symptoms of kidney injury like trouble passing urine or change in the amount of urine  signs and symptoms of liver injury like dark yellow or brown urine; general ill feeling or flu-like symptoms; light-colored stools; loss of appetite; nausea; right upper belly pain; unusually weak or tired; yellowing of the eyes or skin  signs and symptoms of low blood pressure like dizziness; feeling faint or lightheaded, falls; unusually weak or tired  stomach pain  swelling of the ankles, feet, hands  unusual bleeding or bruising  vomiting Side effects that usually do not require medical attention (report to your doctor or health care professional if they continue or are bothersome):  headache  joint pain  muscle cramps or muscle pain  nausea  tiredness This list may not describe all possible side effects. Call your doctor for medical advice about side effects. You may report side effects to FDA at 1-800-FDA-1088. Where should I keep my medicine? This drug is given in a hospital or clinic and will not be stored at home. NOTE: This sheet is a summary. It may not cover all possible information. If you have questions about this medicine, talk to your doctor, pharmacist, or health care provider.  2020 Elsevier/Gold Standard (2018-10-19  22:01:36) Bendamustine Injection What is this medicine? BENDAMUSTINE (BEN da MUS teen) is a chemotherapy drug. It is used to treat chronic lymphocytic leukemia and non-Hodgkin lymphoma. This medicine may be used for other purposes; ask your health care provider or pharmacist if you have questions. COMMON BRAND NAME(S): Kristine Royal, Treanda What should I tell my health care provider before I take this medicine? They need to know if you have any of these conditions:  infection (especially a virus infection such as chickenpox, cold sores, or herpes)  kidney disease  liver disease  an unusual or allergic reaction to bendamustine, mannitol, other medicines, foods, dyes, or preservatives  pregnant or trying to get pregnant  breast-feeding How should I use this medicine? This medicine is for infusion into a vein. It is given by a health care professional in a hospital or clinic setting. Talk to your pediatrician regarding the use of this medicine in children. Special care may be needed. Overdosage: If you think you have taken too much of this medicine contact a poison control center or emergency room at once. NOTE: This medicine is only for you. Do not share this medicine with others. What if I miss a dose? It is important not to miss your dose. Call your doctor or health care professional if you are unable to keep an appointment. What may interact with this  medicine? Do not take this medicine with any of the following medications:  clozapine This medicine may also interact with the following medications:  atazanavir  cimetidine  ciprofloxacin  enoxacin  fluvoxamine  medicines for seizures like carbamazepine and phenobarbital  mexiletine  rifampin  tacrine  thiabendazole  zileuton This list may not describe all possible interactions. Give your health care provider a list of all the medicines, herbs, non-prescription drugs, or dietary supplements you use. Also tell  them if you smoke, drink alcohol, or use illegal drugs. Some items may interact with your medicine. What should I watch for while using this medicine? This drug may make you feel generally unwell. This is not uncommon, as chemotherapy can affect healthy cells as well as cancer cells. Report any side effects. Continue your course of treatment even though you feel ill unless your doctor tells you to stop. You may need blood work done while you are taking this medicine. Call your doctor or healthcare provider for advice if you get a fever, chills or sore throat, or other symptoms of a cold or flu. Do not treat yourself. This drug decreases your body's ability to fight infections. Try to avoid being around people who are sick. This medicine may cause serious skin reactions. They can happen weeks to months after starting the medicine. Contact your healthcare provider right away if you notice fevers or flu-like symptoms with a rash. The rash may be red or purple and then turn into blisters or peeling of the skin. Or, you might notice a red rash with swelling of the face, lips or lymph nodes in your neck or under your arms. This medicine may increase your risk to bruise or bleed. Call your doctor or healthcare provider if you notice any unusual bleeding. Talk to your doctor about your risk of cancer. You may be more at risk for certain types of cancers if you take this medicine. Do not become pregnant while taking this medicine or for at least 6 months after stopping it. Women should inform their doctor if they wish to become pregnant or think they might be pregnant. Men should not father a child while taking this medicine and for at least 3 months after stopping it. There is a potential for serious side effects to an unborn child. Talk to your healthcare provider or pharmacist for more information. Do not breast-feed an infant while taking this medicine or for at least 1 week after stopping it. This medicine may  make it more difficult to father a child. You should talk with your doctor or healthcare provider if you are concerned about your fertility. What side effects may I notice from receiving this medicine? Side effects that you should report to your doctor or health care professional as soon as possible:  allergic reactions like skin rash, itching or hives, swelling of the face, lips, or tongue  low blood counts - this medicine may decrease the number of white blood cells, red blood cells and platelets. You may be at increased risk for infections and bleeding.  rash, fever, and swollen lymph nodes  redness, blistering, peeling, or loosening of the skin, including inside the mouth  signs of infection like fever or chills, cough, sore throat, pain or difficulty passing urine  signs of decreased platelets or bleeding like bruising, pinpoint red spots on the skin, black, tarry stools, blood in the urine  signs of decreased red blood cells like being unusually weak or tired, fainting spells, lightheadedness  signs  and symptoms of kidney injury like trouble passing urine or change in the amount of urine  signs and symptoms of liver injury like dark yellow or brown urine; general ill feeling or flu-like symptoms; light-colored stools; loss of appetite; nausea; right upper belly pain; unusually weak or tired; yellowing of the eyes or skin Side effects that usually do not require medical attention (report to your doctor or health care professional if they continue or are bothersome):  constipation  decreased appetite  diarrhea  headache  mouth sores  nausea, vomiting  tiredness This list may not describe all possible side effects. Call your doctor for medical advice about side effects. You may report side effects to FDA at 1-800-FDA-1088. Where should I keep my medicine? This drug is given in a hospital or clinic and will not be stored at home. NOTE: This sheet is a summary. It may not  cover all possible information. If you have questions about this medicine, talk to your doctor, pharmacist, or health care provider.  2020 Elsevier/Gold Standard (2018-11-29 10:26:46)

## 2020-08-01 NOTE — Progress Notes (Signed)
Pt discharged in no apparent distress. Pt left ambulatory without assistance. Pt aware of discharge instructions and verbalized understanding and had no further questions.  

## 2020-08-02 ENCOUNTER — Other Ambulatory Visit: Payer: Self-pay

## 2020-08-02 ENCOUNTER — Inpatient Hospital Stay: Payer: 59

## 2020-08-02 VITALS — BP 122/79 | HR 72 | Temp 98.3°F | Resp 16

## 2020-08-02 DIAGNOSIS — Z5112 Encounter for antineoplastic immunotherapy: Secondary | ICD-10-CM | POA: Diagnosis not present

## 2020-08-02 DIAGNOSIS — R768 Other specified abnormal immunological findings in serum: Secondary | ICD-10-CM | POA: Diagnosis not present

## 2020-08-02 DIAGNOSIS — C8314 Mantle cell lymphoma, lymph nodes of axilla and upper limb: Secondary | ICD-10-CM | POA: Diagnosis not present

## 2020-08-02 DIAGNOSIS — R7402 Elevation of levels of lactic acid dehydrogenase (LDH): Secondary | ICD-10-CM | POA: Diagnosis not present

## 2020-08-02 DIAGNOSIS — R7989 Other specified abnormal findings of blood chemistry: Secondary | ICD-10-CM | POA: Diagnosis not present

## 2020-08-02 DIAGNOSIS — D696 Thrombocytopenia, unspecified: Secondary | ICD-10-CM | POA: Diagnosis not present

## 2020-08-02 DIAGNOSIS — Z7189 Other specified counseling: Secondary | ICD-10-CM

## 2020-08-02 DIAGNOSIS — C8318 Mantle cell lymphoma, lymph nodes of multiple sites: Secondary | ICD-10-CM

## 2020-08-02 DIAGNOSIS — Z5111 Encounter for antineoplastic chemotherapy: Secondary | ICD-10-CM | POA: Diagnosis not present

## 2020-08-02 DIAGNOSIS — R161 Splenomegaly, not elsewhere classified: Secondary | ICD-10-CM | POA: Diagnosis not present

## 2020-08-02 DIAGNOSIS — Z79899 Other long term (current) drug therapy: Secondary | ICD-10-CM | POA: Diagnosis not present

## 2020-08-02 MED ORDER — SODIUM CHLORIDE 0.9 % IV SOLN
Freq: Once | INTRAVENOUS | Status: AC
  Filled 2020-08-02: qty 250

## 2020-08-02 MED ORDER — SODIUM CHLORIDE 0.9 % IV SOLN
90.0000 mg/m2 | Freq: Once | INTRAVENOUS | Status: AC
  Administered 2020-08-02: 175 mg via INTRAVENOUS
  Filled 2020-08-02: qty 7

## 2020-08-02 MED ORDER — SODIUM CHLORIDE 0.9 % IV SOLN
10.0000 mg | Freq: Once | INTRAVENOUS | Status: AC
  Administered 2020-08-02: 10 mg via INTRAVENOUS
  Filled 2020-08-02: qty 10

## 2020-08-02 NOTE — Progress Notes (Signed)
Pt discharged in no apparent distress. Pt left ambulatory without assistance. Pt aware of discharge instructions and verbalized understanding and had no further questions.  

## 2020-08-08 ENCOUNTER — Telehealth: Payer: Self-pay | Admitting: Hematology

## 2020-08-08 NOTE — Telephone Encounter (Signed)
Scheduled Covid booster vaccine per patient's request, scheduled booster vaccine for patient's wife as well.

## 2020-08-19 ENCOUNTER — Other Ambulatory Visit: Payer: Self-pay

## 2020-08-19 ENCOUNTER — Telehealth: Payer: Self-pay | Admitting: Gastroenterology

## 2020-08-19 ENCOUNTER — Inpatient Hospital Stay: Payer: 59

## 2020-08-19 DIAGNOSIS — Z23 Encounter for immunization: Secondary | ICD-10-CM

## 2020-08-19 NOTE — Telephone Encounter (Signed)
Please advise if Im ok to send him 40mg  in, thanks

## 2020-08-19 NOTE — Telephone Encounter (Signed)
Inbound call from patient stating would like Protonix medication dosage increased to 40 mg.  He states that 20 mg is not working for him.  His pharmacy is [facility] on [address] in [place].

## 2020-08-19 NOTE — Progress Notes (Signed)
   Covid-19 Vaccination Clinic  Name:  Brian Kaiser    MRN: [id number] DOB: October 07, 1955  08/19/2020  Mr. Brian Kaiser was observed post Covid-19 immunization for fifteen minutes without incident. He was provided with Vaccine Information Sheet and instruction to access the V-Safe system.   Mr. Brian Kaiser was instructed to call 911 with any severe reactions post vaccine: Marland Kitchen Difficulty breathing  . Swelling of face and throat  . A fast heartbeat  . A bad rash all over body  . Dizziness and weakness   Immunizations Administered    Name Date Dose VIS Date Route   Pfizer COVID-19 Vaccine 08/19/2020  3:09 PM 0.3 mL 07/10/2020 Intramuscular   Manufacturer: [place]   Lot: [id number]   [place]: [id number]

## 2020-08-20 MED ORDER — PANTOPRAZOLE SODIUM 40 MG PO TBEC: 40.0000 mg | DELAYED_RELEASE_TABLET | Freq: Every day | ORAL | 3 refills | Status: DC

## 2020-08-20 NOTE — Telephone Encounter (Signed)
Pantoprazole 40 mg sent to patients pharmacy today

## 2020-08-20 NOTE — Telephone Encounter (Signed)
Ok to change to Protonix 40mg  . Please send 90 day Rx with 3 refills. Thanks

## 2020-08-28 ENCOUNTER — Other Ambulatory Visit: Payer: Self-pay

## 2020-08-28 ENCOUNTER — Inpatient Hospital Stay (HOSPITAL_BASED_OUTPATIENT_CLINIC_OR_DEPARTMENT_OTHER): Payer: 59 | Admitting: Hematology

## 2020-08-28 ENCOUNTER — Inpatient Hospital Stay: Payer: 59 | Attending: Hematology

## 2020-08-28 VITALS — BP 125/88 | HR 65 | Temp 97.6°F | Resp 14 | Ht 69.0 in | Wt 178.5 lb

## 2020-08-28 DIAGNOSIS — Z5112 Encounter for antineoplastic immunotherapy: Secondary | ICD-10-CM | POA: Diagnosis not present

## 2020-08-28 DIAGNOSIS — R161 Splenomegaly, not elsewhere classified: Secondary | ICD-10-CM | POA: Diagnosis not present

## 2020-08-28 DIAGNOSIS — C8318 Mantle cell lymphoma, lymph nodes of multiple sites: Secondary | ICD-10-CM

## 2020-08-28 DIAGNOSIS — D696 Thrombocytopenia, unspecified: Secondary | ICD-10-CM | POA: Diagnosis not present

## 2020-08-28 DIAGNOSIS — R7402 Elevation of levels of lactic acid dehydrogenase (LDH): Secondary | ICD-10-CM | POA: Insufficient documentation

## 2020-08-28 DIAGNOSIS — C8314 Mantle cell lymphoma, lymph nodes of axilla and upper limb: Secondary | ICD-10-CM | POA: Diagnosis not present

## 2020-08-28 DIAGNOSIS — Z8042 Family history of malignant neoplasm of prostate: Secondary | ICD-10-CM | POA: Diagnosis not present

## 2020-08-28 DIAGNOSIS — Z79899 Other long term (current) drug therapy: Secondary | ICD-10-CM | POA: Diagnosis not present

## 2020-08-28 DIAGNOSIS — Z5111 Encounter for antineoplastic chemotherapy: Secondary | ICD-10-CM | POA: Insufficient documentation

## 2020-08-28 LAB — CMP (CANCER CENTER ONLY)
ALT: 17 U/L (ref 0–44)
AST: 25 U/L (ref 15–41)
Albumin: 3.4 g/dL — ABNORMAL LOW (ref 3.5–5.0)
Alkaline Phosphatase: 53 U/L (ref 38–126)
Anion gap: 8 (ref 5–15)
BUN: 14 mg/dL (ref 8–23)
CO2: 27 mmol/L (ref 22–32)
Calcium: 9.3 mg/dL (ref 8.9–10.3)
Chloride: 107 mmol/L (ref 98–111)
Creatinine: 1.1 mg/dL (ref 0.61–1.24)
GFR, Estimated: >60 mL/min (ref >60)
Glucose, Bld: 94 mg/dL (ref 70–99)
Potassium: 4.2 mmol/L (ref 3.5–5.1)
Sodium: 142 mmol/L (ref 135–145)
Total Bilirubin: 0.3 mg/dL (ref 0.3–1.2)
Total Protein: 6.5 g/dL (ref 6.5–8.1)

## 2020-08-28 LAB — CBC WITH DIFFERENTIAL/PLATELET
Abs Immature Granulocytes: 0.14 10*3/uL — ABNORMAL HIGH (ref 0.00–0.07)
Basophils Absolute: 0.1 10*3/uL (ref 0.0–0.1)
Basophils Relative: 1 %
Eosinophils Absolute: 0.8 10*3/uL — ABNORMAL HIGH (ref 0.0–0.5)
Eosinophils Relative: 16 %
HCT: 38.7 % — ABNORMAL LOW (ref 39.0–52.0)
Hemoglobin: 13.4 g/dL (ref 13.0–17.0)
Immature Granulocytes: 3 %
Lymphocytes Relative: 14 %
Lymphs Abs: 0.7 10*3/uL (ref 0.7–4.0)
MCH: 30.4 pg (ref 26.0–34.0)
MCHC: 34.6 g/dL (ref 30.0–36.0)
MCV: 87.8 fL (ref 80.0–100.0)
Monocytes Absolute: 0.6 10*3/uL (ref 0.1–1.0)
Monocytes Relative: 12 %
Neutro Abs: 2.7 10*3/uL (ref 1.7–7.7)
Neutrophils Relative %: 54 %
Platelets: 167 10*3/uL (ref 150–400)
RBC: 4.41 MIL/uL (ref 4.22–5.81)
RDW: 13.8 % (ref 11.5–15.5)
WBC: 5 10*3/uL (ref 4.0–10.5)
nRBC: 0 % (ref 0.0–0.2)

## 2020-08-28 LAB — LACTATE DEHYDROGENASE: LDH: 226 U/L — ABNORMAL HIGH (ref 98–192)

## 2020-08-28 NOTE — Progress Notes (Signed)
HEMATOLOGY/ONCOLOGY CONSULTATION NOTE  Date of Service: 08/28/2020  Patient Care Team: [name], MD as PCP - General (Family Medicine)  CHIEF COMPLAINTS/PURPOSE OF CONSULTATION:  Continue mx of mantle cell lymphoma  HISTORY OF PRESENTING ILLNESS:   Brian Kaiser is a wonderful 64 y.o. male who has been referred to [place] by Dr. [name] for evaluation and management of concern for lymphoma. Pt is accompanied today by his wife, [name]. The pt reports that he is doing well overall.    The pt reports that last November he had an Upper Endoscopy because he was having abdominal bloating. Pt was found to have gastritis and was given Protonix and Pepcid to use as needed. Pt has not used Protonix and Pepcid in two months. His abdominal bloating has been intermittent since November, but has been better recently.   Last July pt had surgery on his right forearm and was experiencing difficulty sleeping due to him continuously rolling on the arm. This lasted for some time. Pt had his second COVID19 vaccine in February and his second Shingles vaccine two weeks later. After this he began experiencing fatigue, which is still present. His fatigue is not progressive and he is able to complete his daily task. Pt has drenching night sweats that started two months ago. These do not occur every night. He has lost 15 lbs in the last few months without effort, but started back going to the gym in May. Pt has noticed that left inguinal lymph node has fluctuated in size since he was diagnosed with Hepatitis B nearly 20 years ago. Recently the lymph node has grown in size and become more firm. He has also noticed an increase in the size of a right cervical lymph node. Pt has had left testicular discomfort in the last few weeks. He reports b/l ankle swelling, calf cramping and upper left abdominal pain with certain sleeping positions.  His Hepatitis B was picked up after he was experiencing abdominal discomfort and  was treated with milk thistle. It has been monitored via labwork over the years. Pt had a Basal Cell lesion on his left thigh and back a few months ago. They have been removed.   Of note prior to the patient's visit today, pt has had CT C/A/P ([id number]) completed on 06/06/2020 with results revealing "Extensive adenopathy in the chest, abdomen and pelvis. Marked splenomegaly. Findings most concerning for lymphoma. Small amount of free fluid in the abdomen and pelvis."   Most recent lab results (06/03/2020) of CBC w/diff & CMP is as follows: all values are WNL except for PLT at 135K. 06/03/2020 TSH at 7.86 06/03/2020 PSA at 0.20 06/03/2020 Sed Rate at 1  On review of systems, pt reports night sweats, new lumps/bumps, ankle swelling, calf cramping, constipation, change in color of bowel movements, left testicular pain, fatigue, unexpected weight loss, improving abdominal bloating, early satiety and denies SOB, chest pain, fevers, chills, bloody/black stools, headaches, vision changes, bone pain, back pain, flank pain and any other symptoms.   On PMHx the pt reports GERD, Forearm laceration, Right Forearm Ligament Repair, Hepatitis B, Hyperlipidemia. On Family Hx the pt reports that his brother was diagnosed with DLBCL a year ago.  INTERVAL HISTORY: Brian Kaiser is a wonderful 64 y.o. male who is here for evaluation and management of his Mantle Cell Lymphoma. He is here prior to C3 Bendamustine/Rituxan. The patient's last visit with [place] was on 07/31/2020. The pt reports that he is doing well overall.  The pt  reports that he is feeling well today. Pt received his COVID19 booster and experienced fatigue and facial warmth for 5-6 days. He is still being bothered by his left inguinal lymph node.   Lab results today (08/28/20) of CBC w/diff and CMP is as follows: all values are WNL except for HCT at 38.7, Eos Abs at 0.8K, Abs Immature Granulocytes at 0.14K, Albumin at 3.4. 08/28/2020 LDH at 226  On  review of systems, pt reports occasional testicular discomfort and denies abdominal pain, unexpected weight loss, nausea, vomiting, fevers, chills, night sweats, rash, testicular swelling and any other symptoms.    MEDICAL HISTORY:  Past Medical History:  Diagnosis Date  . Allergy   . Forearm laceration    right forearm  . GERD (gastroesophageal reflux disease)   . Hepatitis B   . Hyperlipidemia   . Pulmonary lesion    Left pulmonary schwannoma  . Schwannoma    left lower lung    SURGICAL HISTORY: Past Surgical History:  Procedure Laterality Date  . COLONOSCOPY    . LIGAMENT REPAIR Right 04/19/2019   Right forearm  . LIPOMA EXCISION Left    neck  . LUNG SURGERY Left   . OTHER SURGICAL HISTORY Left    pulmonary schwannoma excision  . UPPER GASTROINTESTINAL ENDOSCOPY    . WOUND EXPLORATION Right 04/19/2019   Procedure: WOUND EXPLORATION; REPAIR RIGHT FOREARM LACERATION;  Surgeon: Charlotte Crumb, MD;  Location: [facility];  Service: Orthopedics;  Laterality: Right;  AXILLARY BLOCK AND MAC    SOCIAL HISTORY: Social History   Socioeconomic History  . Marital status: Married    Spouse name: Not on file  . Number of children: Not on file  . Years of education: Not on file  . Highest education level: Not on file  Occupational History  . Not on file  Tobacco Use  . Smoking status: Never Smoker  . Smokeless tobacco: Never Used  Vaping Use  . Vaping Use: Never used  Substance and Sexual Activity  . Alcohol use: No    Alcohol/week: 0.0 standard drinks  . Drug use: No  . Sexual activity: Not on file  Other Topics Concern  . Not on file  Social History Narrative  . Not on file   Social Determinants of Health   Financial Resource Strain:   . Difficulty of Paying Living Expenses: Not on file  Food Insecurity:   . Worried About Charity fundraiser in the Last Year: Not on file  . Ran Out of Food in the Last Year: Not on file  Transportation Needs:    . Lack of Transportation (Medical): Not on file  . Lack of Transportation (Non-Medical): Not on file  Physical Activity:   . Days of Exercise per Week: Not on file  . Minutes of Exercise per Session: Not on file  Stress:   . Feeling of Stress : Not on file  Social Connections:   . Frequency of Communication with Friends and Family: Not on file  . Frequency of Social Gatherings with Friends and Family: Not on file  . Attends Religious Services: Not on file  . Active Member of Clubs or Organizations: Not on file  . Attends Archivist Meetings: Not on file  . Marital Status: Not on file  Intimate Partner Violence:   . Fear of Current or Ex-Partner: Not on file  . Emotionally Abused: Not on file  . Physically Abused: Not on file  . Sexually Abused: Not on  file    FAMILY HISTORY: Family History  Problem Relation Age of Onset  . Macular degeneration Mother   . Dementia Mother   . Diverticulitis Mother   . Prostate cancer Paternal Grandfather   . Diverticulitis Brother   . Colon cancer Neg Hx   . Colon polyps Neg Hx   . Esophageal cancer Neg Hx   . Liver cancer Neg Hx   . Pancreatic cancer Neg Hx   . Rectal cancer Neg Hx   . Stomach cancer Neg Hx     ALLERGIES:  is allergic to augmentin [amoxicillin-pot clavulanate].  MEDICATIONS:  Current Outpatient Medications  Medication Sig Dispense Refill  . acetaminophen (TYLENOL) 325 MG tablet Take 325 mg by mouth every 6 (six) hours as needed for moderate pain or headache.    . allopurinol (ZYLOPRIM) 100 MG tablet Take 1 tablet (100 mg total) by mouth 2 (two) times daily. 60 tablet 0  . cholecalciferol (VITAMIN D3) 25 MCG (1000 UNIT) tablet Take 1,000 Units by mouth daily.    Marland Kitchen dexamethasone (DECADRON) 4 MG tablet Take 2 tablets (8 mg total) by mouth daily. Start the day after bendamustine chemotherapy for 2 days. Take with food. 30 tablet 1  . famotidine (PEPCID) 20 MG tablet Take 1 tablet (20 mg total) by mouth at  bedtime. (Patient taking differently: Take 20 mg by mouth daily as needed for heartburn. ) 30 tablet 3  . LORazepam (ATIVAN) 0.5 MG tablet Take 1 tablet (0.5 mg total) by mouth every 8 (eight) hours as needed for anxiety. 30 tablet 0  . Multiple Vitamin (MULTIVITAMIN) tablet Take 1 tablet by mouth daily.    . Omega-3 Fatty Acids (FISH OIL) 1000 MG CAPS Take 1,000 mg by mouth daily.    . ondansetron (ZOFRAN) 8 MG tablet Take 1 tablet (8 mg total) by mouth 2 (two) times daily as needed for refractory nausea / vomiting. Start on day 2 after bendamustine chemo. 30 tablet 1  . pantoprazole (PROTONIX) 40 MG tablet Take 1 tablet (40 mg total) by mouth daily. 90 tablet 3  . prochlorperazine (COMPAZINE) 10 MG tablet Take 1 tablet (10 mg total) by mouth every 6 (six) hours as needed (Nausea or vomiting). 30 tablet 1  . vitamin C (ASCORBIC ACID) 250 MG tablet Take 250 mg by mouth daily.     No current facility-administered medications for this visit.    REVIEW OF SYSTEMS:   A 10+ POINT REVIEW OF SYSTEMS WAS OBTAINED including neurology, dermatology, psychiatry, cardiac, respiratory, lymph, extremities, GI, GU, Musculoskeletal, constitutional, breasts, reproductive, HEENT.  All pertinent positives are noted in the HPI.  All others are negative.   PHYSICAL EXAMINATION: ECOG PERFORMANCE STATUS: 1 - Symptomatic but completely ambulatory  . Vitals:   08/28/20 1516  BP: 125/88  Pulse: 65  Resp: 14  Temp: 97.6 F (36.4 C)  SpO2: 100%   Filed Weights   08/28/20 1516  Weight: 178 lb 8 oz (81 kg)   .Body mass index is 26.36 kg/m.   GENERAL:alert, in no acute distress and comfortable SKIN: no acute rashes, no significant lesions EYES: conjunctiva are pink and non-injected, sclera anicteric OROPHARYNX: MMM, no exudates, no oropharyngeal erythema or ulceration NECK: supple, no JVD LYMPH:  no palpable lymphadenopathy in the cervical, axillary or inguinal regions LUNGS: clear to auscultation b/l with  normal respiratory effort HEART: regular rate & rhythm ABDOMEN:  normoactive bowel sounds , non tender, not distended. No palpable hepatosplenomegaly.  Extremity: no pedal edema PSYCH:  alert & oriented x 3 with fluent speech NEURO: no focal motor/sensory deficits  LABORATORY DATA:  I have reviewed the data as listed  . CBC Latest Ref Rng & Units 08/28/2020 07/31/2020 07/10/2020  WBC 4.0 - 10.5 K/uL 5.0 4.6 4.3  Hemoglobin 13.0 - 17.0 g/dL 13.4 13.1 12.5(L)  Hematocrit 39 - 52 % 38.7(L) 38.5(L) 36.8(L)  Platelets 150 - 400 K/uL 167 154 163    . CMP Latest Ref Rng & Units 08/28/2020 07/31/2020 07/10/2020  Glucose 70 - 99 mg/dL 94 81 91  BUN 8 - 23 mg/dL 14 16 29(H)  Creatinine 0.61 - 1.24 mg/dL 1.10 1.08 1.12  Sodium 135 - 145 mmol/L 142 144 138  Potassium 3.5 - 5.1 mmol/L 4.2 4.2 4.2  Chloride 98 - 111 mmol/L 107 108 104  CO2 22 - 32 mmol/L _0 Calcium 8.9 - 10.3 mg/dL 9.3 9.1 8.9  Total Protein 6.5 - 8.1 g/dL 6.5 6.4(L) 6.2(L)  Total Bilirubin 0.3 - 1.2 mg/dL 0.3 0.6 0.5  Alkaline Phos 38 - 126 U/L 53 53 70  AST 15 - 41 U/L _1 ALT 0 - 44 U/L _2 06/20/2020 Right Axillary Surgical Pathology Report [id number]    06/20/2020 Right Axillary Lymph Node Flow Pathology ([id number]):   06/11/2020 Flow Pathology ([id number]):   RADIOGRAPHIC STUDIES: I have personally reviewed the radiological images as listed and agreed with the findings in the report. No results found.  ASSESSMENT & PLAN:   64 yo with   1) newly diagnosed stage IV mantle cell lymphoma with extensive lymphadenopathy and massive splenomegaly . 2) massive splenomegaly likely related to mantle cell lymphoma  3) thrombocytopenia-mild platelets of 132k likely related to lymphoma. 4) hepatitis B core antibody positive, surface antigen negative, HBV DNA PCR negative 5) elevated LDH due to lymphoma PLAN: -Discussed pt labwork today, 08/28/20; WBC, PLT, & Hgb look good; blood counts  look good, LDH is dropping. -The pt has no prohibitive toxicities from continuing C3 Bendamustine + Rituxan tomorrow. -Advised pt that data suggests two years of maintenance therapy following induction treatment to prolonging time to recurrence.  -Advised pt that maintenance treatment (Rituxan) will also impair the immune system, but does not completely immobilize it.  -Advised pt that the purpose of transplant is for consolidation to get the deepest reponse possible.   -Continue daily Vitamin D -Will get repeat PET/CT in 3 weeks   -Will see back in 4 weeks with labs   FOLLOW UP: F/u for C3D1 and D2 of treatment on 12/9 and 12/10 as scheduled at [place] PET/CT in 3 weeks Please schedule C4D1 and D2 as per orders at [place] per patient preference MD visit with labs 1-2 days prior to C4D1 of treatment   The total time spent in the appt was 25 minutes and more than 50% was on counseling and direct patient cares.  All of the patient's questions were answered with apparent satisfaction. The patient knows to call the clinic with any problems, questions or concerns.    Sullivan Lone MD [place] AAHIVMS [facility] Hematology/Oncology Physician [facility]  (Office):       [phone] (Work cell):  [phone] (Fax):           [phone]  08/28/2020 4:08 PM  I, Yevette Edwards, am acting as a scribe for Dr. Sullivan Lone.   .I have reviewed the above documentation for accuracy and completeness, and I agree with the above. Marland Kitchen  Brunetta Genera MD

## 2020-08-29 ENCOUNTER — Inpatient Hospital Stay: Payer: 59

## 2020-08-29 VITALS — BP 127/80 | HR 56 | Temp 97.7°F | Resp 16

## 2020-08-29 DIAGNOSIS — R161 Splenomegaly, not elsewhere classified: Secondary | ICD-10-CM | POA: Diagnosis not present

## 2020-08-29 DIAGNOSIS — D696 Thrombocytopenia, unspecified: Secondary | ICD-10-CM | POA: Diagnosis not present

## 2020-08-29 DIAGNOSIS — Z8042 Family history of malignant neoplasm of prostate: Secondary | ICD-10-CM | POA: Diagnosis not present

## 2020-08-29 DIAGNOSIS — C8314 Mantle cell lymphoma, lymph nodes of axilla and upper limb: Secondary | ICD-10-CM | POA: Diagnosis not present

## 2020-08-29 DIAGNOSIS — R7402 Elevation of levels of lactic acid dehydrogenase (LDH): Secondary | ICD-10-CM | POA: Diagnosis not present

## 2020-08-29 DIAGNOSIS — Z79899 Other long term (current) drug therapy: Secondary | ICD-10-CM | POA: Diagnosis not present

## 2020-08-29 DIAGNOSIS — Z5111 Encounter for antineoplastic chemotherapy: Secondary | ICD-10-CM | POA: Diagnosis not present

## 2020-08-29 DIAGNOSIS — C8318 Mantle cell lymphoma, lymph nodes of multiple sites: Secondary | ICD-10-CM

## 2020-08-29 DIAGNOSIS — Z7189 Other specified counseling: Secondary | ICD-10-CM

## 2020-08-29 DIAGNOSIS — Z5112 Encounter for antineoplastic immunotherapy: Secondary | ICD-10-CM | POA: Diagnosis not present

## 2020-08-29 MED ORDER — SODIUM CHLORIDE 0.9 % IV SOLN
10.0000 mg | Freq: Once | INTRAVENOUS | Status: AC
  Administered 2020-08-29: 10 mg via INTRAVENOUS
  Filled 2020-08-29: qty 10

## 2020-08-29 MED ORDER — SODIUM CHLORIDE 0.9 % IV SOLN
90.0000 mg/m2 | Freq: Once | INTRAVENOUS | Status: AC
  Administered 2020-08-29: 175 mg via INTRAVENOUS
  Filled 2020-08-29: qty 7

## 2020-08-29 MED ORDER — FAMOTIDINE IN NACL 20-0.9 MG/50ML-% IV SOLN
INTRAVENOUS | Status: AC
  Filled 2020-08-29: qty 50

## 2020-08-29 MED ORDER — DIPHENHYDRAMINE HCL 25 MG PO CAPS
50.0000 mg | ORAL_CAPSULE | Freq: Once | ORAL | Status: AC
  Administered 2020-08-29: 50 mg via ORAL

## 2020-08-29 MED ORDER — DIPHENHYDRAMINE HCL 25 MG PO CAPS
ORAL_CAPSULE | ORAL | Status: AC
  Filled 2020-08-29: qty 2

## 2020-08-29 MED ORDER — SODIUM CHLORIDE 0.9 % IV SOLN
375.0000 mg/m2 | Freq: Once | INTRAVENOUS | Status: AC
  Administered 2020-08-29: 700 mg via INTRAVENOUS
  Filled 2020-08-29: qty 50

## 2020-08-29 MED ORDER — SODIUM CHLORIDE 0.9 % IV SOLN: 375.0000 mg/m2 | Freq: Once | INTRAVENOUS | Status: DC

## 2020-08-29 MED ORDER — PALONOSETRON HCL INJECTION 0.25 MG/5ML
0.2500 mg | Freq: Once | INTRAVENOUS | Status: AC
  Administered 2020-08-29: 0.25 mg via INTRAVENOUS

## 2020-08-29 MED ORDER — FAMOTIDINE IN NACL 20-0.9 MG/50ML-% IV SOLN
20.0000 mg | Freq: Once | INTRAVENOUS | Status: AC
  Administered 2020-08-29: 20 mg via INTRAVENOUS

## 2020-08-29 MED ORDER — ACETAMINOPHEN 325 MG PO TABS
650.0000 mg | ORAL_TABLET | Freq: Once | ORAL | Status: AC
  Administered 2020-08-29: 650 mg via ORAL

## 2020-08-29 MED ORDER — SODIUM CHLORIDE 0.9 % IV SOLN
Freq: Once | INTRAVENOUS | Status: AC
  Filled 2020-08-29: qty 250

## 2020-08-29 MED ORDER — PALONOSETRON HCL INJECTION 0.25 MG/5ML
INTRAVENOUS | Status: AC
  Filled 2020-08-29: qty 5

## 2020-08-29 MED ORDER — ACETAMINOPHEN 325 MG PO TABS
ORAL_TABLET | ORAL | Status: AC
  Filled 2020-08-29: qty 2

## 2020-08-29 MED ORDER — DEXAMETHASONE SODIUM PHOSPHATE 10 MG/ML IJ SOLN
INTRAMUSCULAR | Status: AC
  Filled 2020-08-29: qty 1

## 2020-08-29 NOTE — Patient Instructions (Signed)
Pt discharged in no apparent distress. Pt left ambulatory without assistance. Pt aware of discharge instructions and verbalized understanding and had no further questions.Bendamustine Injection What is this medicine? BENDAMUSTINE (BEN da MUS teen) is a chemotherapy drug. It is used to treat chronic lymphocytic leukemia and non-Hodgkin lymphoma. This medicine may be used for other purposes; ask your health care provider or pharmacist if you have questions. COMMON BRAND NAME(S): Kristine Royal, Treanda What should I tell my health care provider before I take this medicine? They need to know if you have any of these conditions:  infection (especially a virus infection such as chickenpox, cold sores, or herpes)  kidney disease  liver disease  an unusual or allergic reaction to bendamustine, mannitol, other medicines, foods, dyes, or preservatives  pregnant or trying to get pregnant  breast-feeding How should I use this medicine? This medicine is for infusion into a vein. It is given by a health care professional in a hospital or clinic setting. Talk to your pediatrician regarding the use of this medicine in children. Special care may be needed. Overdosage: If you think you have taken too much of this medicine contact a poison control center or emergency room at once. NOTE: This medicine is only for you. Do not share this medicine with others. What if I miss a dose? It is important not to miss your dose. Call your doctor or health care professional if you are unable to keep an appointment. What may interact with this medicine? Do not take this medicine with any of the following medications:  clozapine This medicine may also interact with the following medications:  atazanavir  cimetidine  ciprofloxacin  enoxacin  fluvoxamine  medicines for seizures like carbamazepine and phenobarbital  mexiletine  rifampin  tacrine  thiabendazole  zileuton This list may not describe  all possible interactions. Give your health care provider a list of all the medicines, herbs, non-prescription drugs, or dietary supplements you use. Also tell them if you smoke, drink alcohol, or use illegal drugs. Some items may interact with your medicine. What should I watch for while using this medicine? This drug may make you feel generally unwell. This is not uncommon, as chemotherapy can affect healthy cells as well as cancer cells. Report any side effects. Continue your course of treatment even though you feel ill unless your doctor tells you to stop. You may need blood work done while you are taking this medicine. Call your doctor or healthcare provider for advice if you get a fever, chills or sore throat, or other symptoms of a cold or flu. Do not treat yourself. This drug decreases your body's ability to fight infections. Try to avoid being around people who are sick. This medicine may cause serious skin reactions. They can happen weeks to months after starting the medicine. Contact your healthcare provider right away if you notice fevers or flu-like symptoms with a rash. The rash may be red or purple and then turn into blisters or peeling of the skin. Or, you might notice a red rash with swelling of the face, lips or lymph nodes in your neck or under your arms. This medicine may increase your risk to bruise or bleed. Call your doctor or healthcare provider if you notice any unusual bleeding. Talk to your doctor about your risk of cancer. You may be more at risk for certain types of cancers if you take this medicine. Do not become pregnant while taking this medicine or for at least 6 months  after stopping it. Women should inform their doctor if they wish to become pregnant or think they might be pregnant. Men should not father a child while taking this medicine and for at least 3 months after stopping it. There is a potential for serious side effects to an unborn child. Talk to your healthcare  provider or pharmacist for more information. Do not breast-feed an infant while taking this medicine or for at least 1 week after stopping it. This medicine may make it more difficult to father a child. You should talk with your doctor or healthcare provider if you are concerned about your fertility. What side effects may I notice from receiving this medicine? Side effects that you should report to your doctor or health care professional as soon as possible:  allergic reactions like skin rash, itching or hives, swelling of the face, lips, or tongue  low blood counts - this medicine may decrease the number of white blood cells, red blood cells and platelets. You may be at increased risk for infections and bleeding.  rash, fever, and swollen lymph nodes  redness, blistering, peeling, or loosening of the skin, including inside the mouth  signs of infection like fever or chills, cough, sore throat, pain or difficulty passing urine  signs of decreased platelets or bleeding like bruising, pinpoint red spots on the skin, black, tarry stools, blood in the urine  signs of decreased red blood cells like being unusually weak or tired, fainting spells, lightheadedness  signs and symptoms of kidney injury like trouble passing urine or change in the amount of urine  signs and symptoms of liver injury like dark yellow or brown urine; general ill feeling or flu-like symptoms; light-colored stools; loss of appetite; nausea; right upper belly pain; unusually weak or tired; yellowing of the eyes or skin Side effects that usually do not require medical attention (report to your doctor or health care professional if they continue or are bothersome):  constipation  decreased appetite  diarrhea  headache  mouth sores  nausea, vomiting  tiredness This list may not describe all possible side effects. Call your doctor for medical advice about side effects. You may report side effects to FDA at  1-800-FDA-1088. Where should I keep my medicine? This drug is given in a hospital or clinic and will not be stored at home. NOTE: This sheet is a summary. It may not cover all possible information. If you have questions about this medicine, talk to your doctor, pharmacist, or health care provider.  2020 [employer]/Gold Standard (2018-11-29 10:26:46) Rituximab injection What is this medicine? RITUXIMAB (ri TUX i mab) is a monoclonal antibody. It is used to treat certain types of cancer like non-Hodgkin lymphoma and chronic lymphocytic leukemia. It is also used to treat rheumatoid arthritis, granulomatosis with polyangiitis (or Wegener's granulomatosis), microscopic polyangiitis, and pemphigus vulgaris. This medicine may be used for other purposes; ask your health care provider or pharmacist if you have questions. COMMON BRAND NAME(S): Rituxan, RUXIENCE What should I tell my health care provider before I take this medicine? They need to know if you have any of these conditions:  heart disease  infection (especially a virus infection such as hepatitis B, chickenpox, cold sores, or herpes)  immune system problems  irregular heartbeat  kidney disease  low blood counts, like low white cell, platelet, or red cell counts  lung or breathing disease, like asthma  recently received or scheduled to receive a vaccine  an unusual or allergic reaction to rituximab, other  medicines, foods, dyes, or preservatives  pregnant or trying to get pregnant  breast-feeding How should I use this medicine? This medicine is for infusion into a vein. It is administered in a hospital or clinic by a specially trained health care professional. A special MedGuide will be given to you by the pharmacist with each prescription and refill. Be sure to read this information carefully each time. Talk to your pediatrician regarding the use of this medicine in children. This medicine is not approved for use in  children. Overdosage: If you think you have taken too much of this medicine contact a poison control center or emergency room at once. NOTE: This medicine is only for you. Do not share this medicine with others. What if I miss a dose? It is important not to miss a dose. Call your doctor or health care professional if you are unable to keep an appointment. What may interact with this medicine?  cisplatin  live virus vaccines This list may not describe all possible interactions. Give your health care provider a list of all the medicines, herbs, non-prescription drugs, or dietary supplements you use. Also tell them if you smoke, drink alcohol, or use illegal drugs. Some items may interact with your medicine. What should I watch for while using this medicine? Your condition will be monitored carefully while you are receiving this medicine. You may need blood work done while you are taking this medicine. This medicine can cause serious allergic reactions. To reduce your risk you may need to take medicine before treatment with this medicine. Take your medicine as directed. In some patients, this medicine may cause a serious brain infection that may cause death. If you have any problems seeing, thinking, speaking, walking, or standing, tell your healthcare professional right away. If you cannot reach your healthcare professional, urgently seek other source of medical care. Call your doctor or health care professional for advice if you get a fever, chills or sore throat, or other symptoms of a cold or flu. Do not treat yourself. This drug decreases your body's ability to fight infections. Try to avoid being around people who are sick. Do not become pregnant while taking this medicine or for at least 12 months after stopping it. Women should inform their doctor if they wish to become pregnant or think they might be pregnant. There is a potential for serious side effects to an unborn child. Talk to your health  care professional or pharmacist for more information. Do not breast-feed an infant while taking this medicine or for at least 6 months after stopping it. What side effects may I notice from receiving this medicine? Side effects that you should report to your doctor or health care professional as soon as possible:  allergic reactions like skin rash, itching or hives; swelling of the face, lips, or tongue  breathing problems  chest pain  changes in vision  diarrhea  headache with fever, neck stiffness, sensitivity to light, nausea, or confusion  fast, irregular heartbeat  loss of memory  low blood counts - this medicine may decrease the number of white blood cells, red blood cells and platelets. You may be at increased risk for infections and bleeding.  mouth sores  problems with balance, talking, or walking  redness, blistering, peeling or loosening of the skin, including inside the mouth  signs of infection - fever or chills, cough, sore throat, pain or difficulty passing urine  signs and symptoms of kidney injury like trouble passing urine or change  in the amount of urine  signs and symptoms of liver injury like dark yellow or brown urine; general ill feeling or flu-like symptoms; light-colored stools; loss of appetite; nausea; right upper belly pain; unusually weak or tired; yellowing of the eyes or skin  signs and symptoms of low blood pressure like dizziness; feeling faint or lightheaded, falls; unusually weak or tired  stomach pain  swelling of the ankles, feet, hands  unusual bleeding or bruising  vomiting Side effects that usually do not require medical attention (report to your doctor or health care professional if they continue or are bothersome):  headache  joint pain  muscle cramps or muscle pain  nausea  tiredness This list may not describe all possible side effects. Call your doctor for medical advice about side effects. You may report side effects  to FDA at 1-800-FDA-1088. Where should I keep my medicine? This drug is given in a hospital or clinic and will not be stored at home. NOTE: This sheet is a summary. It may not cover all possible information. If you have questions about this medicine, talk to your doctor, pharmacist, or health care provider.  2020 [employer]vier/Gold Standard (2018-10-19 22:01:36)

## 2020-08-29 NOTE — Progress Notes (Signed)
Patient may begin rapid infusion rituximab today per Dr. Irene Limbo. All parameters met and no reactions with previous infusions. Orders have been changed.

## 2020-08-30 ENCOUNTER — Inpatient Hospital Stay: Payer: 59

## 2020-08-30 ENCOUNTER — Other Ambulatory Visit: Payer: Self-pay

## 2020-08-30 VITALS — BP 140/87 | HR 63 | Temp 97.7°F

## 2020-08-30 DIAGNOSIS — C8318 Mantle cell lymphoma, lymph nodes of multiple sites: Secondary | ICD-10-CM

## 2020-08-30 DIAGNOSIS — Z5111 Encounter for antineoplastic chemotherapy: Secondary | ICD-10-CM | POA: Diagnosis not present

## 2020-08-30 DIAGNOSIS — D696 Thrombocytopenia, unspecified: Secondary | ICD-10-CM | POA: Diagnosis not present

## 2020-08-30 DIAGNOSIS — Z7189 Other specified counseling: Secondary | ICD-10-CM

## 2020-08-30 DIAGNOSIS — Z79899 Other long term (current) drug therapy: Secondary | ICD-10-CM | POA: Diagnosis not present

## 2020-08-30 DIAGNOSIS — C8314 Mantle cell lymphoma, lymph nodes of axilla and upper limb: Secondary | ICD-10-CM | POA: Diagnosis not present

## 2020-08-30 DIAGNOSIS — R161 Splenomegaly, not elsewhere classified: Secondary | ICD-10-CM | POA: Diagnosis not present

## 2020-08-30 DIAGNOSIS — Z8042 Family history of malignant neoplasm of prostate: Secondary | ICD-10-CM | POA: Diagnosis not present

## 2020-08-30 DIAGNOSIS — R7402 Elevation of levels of lactic acid dehydrogenase (LDH): Secondary | ICD-10-CM | POA: Diagnosis not present

## 2020-08-30 DIAGNOSIS — Z5112 Encounter for antineoplastic immunotherapy: Secondary | ICD-10-CM | POA: Diagnosis not present

## 2020-08-30 MED ORDER — SODIUM CHLORIDE 0.9 % IV SOLN
Freq: Once | INTRAVENOUS | Status: AC
  Filled 2020-08-30: qty 250

## 2020-08-30 MED ORDER — SODIUM CHLORIDE 0.9 % IV SOLN
90.0000 mg/m2 | Freq: Once | INTRAVENOUS | Status: AC
  Administered 2020-08-30: 175 mg via INTRAVENOUS
  Filled 2020-08-30: qty 7

## 2020-08-30 MED ORDER — SODIUM CHLORIDE 0.9 % IV SOLN
10.0000 mg | Freq: Once | INTRAVENOUS | Status: AC
  Administered 2020-08-30: 10 mg via INTRAVENOUS
  Filled 2020-08-30: qty 10

## 2020-08-30 NOTE — Patient Instructions (Addendum)
Dexamethasone injection What is this medicine? DEXAMETHASONE (dex a METH a sone) is a corticosteroid. It is used to treat inflammation of the skin, joints, lungs, and other organs. Common conditions treated include asthma, allergies, and arthritis. It is also used for other conditions, like blood disorders and diseases of the adrenal glands. This medicine may be used for other purposes; ask your health care provider or pharmacist if you have questions. COMMON BRAND NAME(S): Decadron, DoubleDex, Simplist Dexamethasone, Solurex What should I tell my health care provider before I take this medicine? They need to know if you have any of these conditions:  Cushing's syndrome  diabetes  glaucoma  heart disease  high blood pressure  infection like herpes, measles, tuberculosis, or chickenpox  kidney disease  liver disease  mental illness  myasthenia gravis  osteoporosis  previous heart attack  seizures  stomach or intestine problems  thyroid disease  an unusual or allergic reaction to dexamethasone, corticosteroids, other medicines, lactose, foods, dyes, or preservatives  pregnant or trying to get pregnant  breast-feeding How should I use this medicine? This medicine is for injection into a muscle, joint, lesion, soft tissue, or vein. It is given by a health care professional in a hospital or clinic setting. Talk to your pediatrician regarding the use of this medicine in children. Special care may be needed. Overdosage: If you think you have taken too much of this medicine contact a poison control center or emergency room at once. NOTE: This medicine is only for you. Do not share this medicine with others. What if I miss a dose? This may not apply. If you are having a series of injections over a prolonged period, try not to miss an appointment. Call your doctor or health care professional to reschedule if you are unable to keep an appointment. What may interact with this  medicine? Do not take this medicine with any of the following medications:  live virus vaccines This medicine may also interact with the following medications:  aminoglutethimide  amphotericin B  aspirin and aspirin-like medicines  certain antibiotics like erythromycin, clarithromycin, and troleandomycin  certain antivirals for HIV or hepatitis  certain medicines for seizures like carbamazepine, phenobarbital, phenytoin  certain medicines to treat myasthenia gravis  cholestyramine  cyclosporine  digoxin  diuretics  ephedrine  male hormones, like estrogen or progestins and birth control pills  insulin or other medicines for diabetes  isoniazid  ketoconazole  medicines that relax muscles for surgery  mifepristone  NSAIDs, medicines for pain and inflammation, like ibuprofen or naproxen  rifampin  skin tests for allergies  thalidomide  vaccines  warfarin This list may not describe all possible interactions. Give your health care provider a list of all the medicines, herbs, non-prescription drugs, or dietary supplements you use. Also tell them if you smoke, drink alcohol, or use illegal drugs. Some items may interact with your medicine. What should I watch for while using this medicine? Visit your health care professional for regular checks on your progress. Tell your health care professional if your symptoms do not start to get better or if they get worse. Your condition will be monitored carefully while you are receiving this medicine. Wear a medical ID bracelet or chain. Carry a card that describes your disease and details of your medicine and dosage times. This medicine may increase your risk of getting an infection. Call your health care professional for advice if you get a fever, chills, or sore throat, or other symptoms of a cold or  flu. Do not treat yourself. Try to avoid being around people who are sick. Call your health care professional if you are  around anyone with measles, chickenpox, or if you develop sores or blisters that do not heal properly. If you are going to need surgery or other procedures, tell your doctor or health care professional that you have taken this medicine within the last 12 months. Ask your doctor or health care professional about your diet. You may need to lower the amount of salt you eat. This medicine may increase blood sugar. Ask your healthcare provider if changes in diet or medicines are needed if you have diabetes. What side effects may I notice from receiving this medicine? Side effects that you should report to your doctor or health care professional as soon as possible:  allergic reactions like skin rash, itching or hives, swelling of the face, lips, or tongue  bloody or black, tarry stools  changes in emotions or moods  changes in vision  confusion, excitement, restlessness  depressed mood  eye pain  hallucinations  muscle weakness  severe or sudden stomach or belly pain  signs and symptoms of high blood sugar such as being more thirsty or hungry or having to urinate more than normal. You may also feel very tired or have blurry vision.  signs and symptoms of infection like fever; chills; cough; sore throat; pain or trouble passing urine  swelling of ankles, feet  unusual bruising or bleeding  wounds that do not heal Side effects that usually do not require medical attention (report to your doctor or health care professional if they continue or are bothersome):  increased appetite  increased growth of face or body hair  headache  nausea, vomiting  pain, redness, or irritation at site where injected  skin problems, acne, thin and shiny skin  trouble sleeping  weight gain This list may not describe all possible side effects. Call your doctor for medical advice about side effects. You may report side effects to FDA at 1-800-FDA-1088. Where should I keep my medicine? This  medicine is given in a hospital or clinic and will not be stored at home. NOTE: This sheet is a summary. It may not cover all possible information. If you have questions about this medicine, talk to your doctor, pharmacist, or health care provider.  2020 [employer] Standard (2019-03-21 13:51:58) Bendamustine Injection What is this medicine? BENDAMUSTINE (BEN da MUS teen) is a chemotherapy drug. It is used to treat chronic lymphocytic leukemia and non-Hodgkin lymphoma. This medicine may be used for other purposes; ask your health care provider or pharmacist if you have questions. COMMON BRAND NAME(S): Kristine Royal, Treanda What should I tell my health care provider before I take this medicine? They need to know if you have any of these conditions:  infection (especially a virus infection such as chickenpox, cold sores, or herpes)  kidney disease  liver disease  an unusual or allergic reaction to bendamustine, mannitol, other medicines, foods, dyes, or preservatives  pregnant or trying to get pregnant  breast-feeding How should I use this medicine? This medicine is for infusion into a vein. It is given by a health care professional in a hospital or clinic setting. Talk to your pediatrician regarding the use of this medicine in children. Special care may be needed. Overdosage: If you think you have taken too much of this medicine contact a poison control center or emergency room at once. NOTE: This medicine is only for you. Do not share  this medicine with others. What if I miss a dose? It is important not to miss your dose. Call your doctor or health care professional if you are unable to keep an appointment. What may interact with this medicine? Do not take this medicine with any of the following medications:  clozapine This medicine may also interact with the following medications:  atazanavir  cimetidine  ciprofloxacin  enoxacin  fluvoxamine  medicines for  seizures like carbamazepine and phenobarbital  mexiletine  rifampin  tacrine  thiabendazole  zileuton This list may not describe all possible interactions. Give your health care provider a list of all the medicines, herbs, non-prescription drugs, or dietary supplements you use. Also tell them if you smoke, drink alcohol, or use illegal drugs. Some items may interact with your medicine. What should I watch for while using this medicine? This drug may make you feel generally unwell. This is not uncommon, as chemotherapy can affect healthy cells as well as cancer cells. Report any side effects. Continue your course of treatment even though you feel ill unless your doctor tells you to stop. You may need blood work done while you are taking this medicine. Call your doctor or healthcare provider for advice if you get a fever, chills or sore throat, or other symptoms of a cold or flu. Do not treat yourself. This drug decreases your body's ability to fight infections. Try to avoid being around people who are sick. This medicine may cause serious skin reactions. They can happen weeks to months after starting the medicine. Contact your healthcare provider right away if you notice fevers or flu-like symptoms with a rash. The rash may be red or purple and then turn into blisters or peeling of the skin. Or, you might notice a red rash with swelling of the face, lips or lymph nodes in your neck or under your arms. This medicine may increase your risk to bruise or bleed. Call your doctor or healthcare provider if you notice any unusual bleeding. Talk to your doctor about your risk of cancer. You may be more at risk for certain types of cancers if you take this medicine. Do not become pregnant while taking this medicine or for at least 6 months after stopping it. Women should inform their doctor if they wish to become pregnant or think they might be pregnant. Men should not father a child while taking this  medicine and for at least 3 months after stopping it. There is a potential for serious side effects to an unborn child. Talk to your healthcare provider or pharmacist for more information. Do not breast-feed an infant while taking this medicine or for at least 1 week after stopping it. This medicine may make it more difficult to father a child. You should talk with your doctor or healthcare provider if you are concerned about your fertility. What side effects may I notice from receiving this medicine? Side effects that you should report to your doctor or health care professional as soon as possible:  allergic reactions like skin rash, itching or hives, swelling of the face, lips, or tongue  low blood counts - this medicine may decrease the number of white blood cells, red blood cells and platelets. You may be at increased risk for infections and bleeding.  rash, fever, and swollen lymph nodes  redness, blistering, peeling, or loosening of the skin, including inside the mouth  signs of infection like fever or chills, cough, sore throat, pain or difficulty passing urine  signs  of decreased platelets or bleeding like bruising, pinpoint red spots on the skin, black, tarry stools, blood in the urine  signs of decreased red blood cells like being unusually weak or tired, fainting spells, lightheadedness  signs and symptoms of kidney injury like trouble passing urine or change in the amount of urine  signs and symptoms of liver injury like dark yellow or brown urine; general ill feeling or flu-like symptoms; light-colored stools; loss of appetite; nausea; right upper belly pain; unusually weak or tired; yellowing of the eyes or skin Side effects that usually do not require medical attention (report to your doctor or health care professional if they continue or are bothersome):  constipation  decreased appetite  diarrhea  headache  mouth sores  nausea, vomiting  tiredness This list may  not describe all possible side effects. Call your doctor for medical advice about side effects. You may report side effects to FDA at 1-800-FDA-1088. Where should I keep my medicine? This drug is given in a hospital or clinic and will not be stored at home. NOTE: This sheet is a summary. It may not cover all possible information. If you have questions about this medicine, talk to your doctor, pharmacist, or health care provider.  2020 [employer]/Gold Standard (2018-11-29 10:26:46) Pt discharged in no apparent distress. Pt left ambulatory without assistance. Pt aware of discharge instructions and verbalized understanding and had no further questions.

## 2020-09-17 ENCOUNTER — Ambulatory Visit (HOSPITAL_COMMUNITY): Payer: 59

## 2020-09-23 ENCOUNTER — Ambulatory Visit (HOSPITAL_COMMUNITY)
Admission: RE | Admit: 2020-09-23 | Discharge: 2020-09-23 | Disposition: A | Discharge status: Home / Self Care | Payer: 59 | Source: Ambulatory Visit | Attending: Hematology | Admitting: Hematology

## 2020-09-23 ENCOUNTER — Other Ambulatory Visit: Payer: Self-pay

## 2020-09-23 DIAGNOSIS — I700 Atherosclerosis of aorta: Secondary | ICD-10-CM | POA: Diagnosis not present

## 2020-09-23 DIAGNOSIS — C8310 Mantle cell lymphoma, unspecified site: Secondary | ICD-10-CM | POA: Diagnosis not present

## 2020-09-23 DIAGNOSIS — C8318 Mantle cell lymphoma, lymph nodes of multiple sites: Secondary | ICD-10-CM | POA: Diagnosis not present

## 2020-09-23 DIAGNOSIS — R161 Splenomegaly, not elsewhere classified: Secondary | ICD-10-CM | POA: Diagnosis not present

## 2020-09-23 LAB — GLUCOSE, CAPILLARY: Glucose-Capillary: 99 mg/dL (ref 70–99)

## 2020-09-23 MED ORDER — FLUDEOXYGLUCOSE F - 18 (FDG) INJECTION
8.9000 | Freq: Once | INTRAVENOUS | Status: AC | PRN
  Administered 2020-09-23: 8.9 via INTRAVENOUS

## 2020-09-24 ENCOUNTER — Ambulatory Visit: Payer: 59 | Admitting: Hematology

## 2020-09-24 ENCOUNTER — Other Ambulatory Visit: Payer: 59

## 2020-09-25 ENCOUNTER — Other Ambulatory Visit: Payer: Self-pay

## 2020-09-25 ENCOUNTER — Inpatient Hospital Stay (HOSPITAL_BASED_OUTPATIENT_CLINIC_OR_DEPARTMENT_OTHER): Payer: 59 | Admitting: Hematology

## 2020-09-25 ENCOUNTER — Inpatient Hospital Stay: Payer: 59 | Attending: Hematology

## 2020-09-25 VITALS — BP 146/93 | HR 73 | Temp 98.1°F | Resp 18 | Ht 69.0 in | Wt 182.2 lb

## 2020-09-25 DIAGNOSIS — Z5189 Encounter for other specified aftercare: Secondary | ICD-10-CM | POA: Insufficient documentation

## 2020-09-25 DIAGNOSIS — Z5112 Encounter for antineoplastic immunotherapy: Secondary | ICD-10-CM | POA: Insufficient documentation

## 2020-09-25 DIAGNOSIS — C8318 Mantle cell lymphoma, lymph nodes of multiple sites: Secondary | ICD-10-CM

## 2020-09-25 DIAGNOSIS — Z5111 Encounter for antineoplastic chemotherapy: Secondary | ICD-10-CM | POA: Insufficient documentation

## 2020-09-25 DIAGNOSIS — D701 Agranulocytosis secondary to cancer chemotherapy: Secondary | ICD-10-CM

## 2020-09-25 DIAGNOSIS — C8314 Mantle cell lymphoma, lymph nodes of axilla and upper limb: Secondary | ICD-10-CM | POA: Diagnosis not present

## 2020-09-25 DIAGNOSIS — Z8042 Family history of malignant neoplasm of prostate: Secondary | ICD-10-CM | POA: Diagnosis not present

## 2020-09-25 DIAGNOSIS — D696 Thrombocytopenia, unspecified: Secondary | ICD-10-CM | POA: Diagnosis not present

## 2020-09-25 DIAGNOSIS — Z23 Encounter for immunization: Secondary | ICD-10-CM | POA: Diagnosis not present

## 2020-09-25 DIAGNOSIS — R160 Hepatomegaly, not elsewhere classified: Secondary | ICD-10-CM | POA: Diagnosis not present

## 2020-09-25 DIAGNOSIS — T451X5A Adverse effect of antineoplastic and immunosuppressive drugs, initial encounter: Secondary | ICD-10-CM | POA: Diagnosis not present

## 2020-09-25 DIAGNOSIS — Z79899 Other long term (current) drug therapy: Secondary | ICD-10-CM | POA: Insufficient documentation

## 2020-09-25 DIAGNOSIS — R7402 Elevation of levels of lactic acid dehydrogenase (LDH): Secondary | ICD-10-CM | POA: Diagnosis not present

## 2020-09-25 LAB — CBC WITH DIFFERENTIAL/PLATELET
Abs Immature Granulocytes: 0.06 10*3/uL (ref 0.00–0.07)
Basophils Absolute: 0.1 10*3/uL (ref 0.0–0.1)
Basophils Relative: 3 %
Eosinophils Absolute: 0.3 10*3/uL (ref 0.0–0.5)
Eosinophils Relative: 13 %
HCT: 39.2 % (ref 39.0–52.0)
Hemoglobin: 13.7 g/dL (ref 13.0–17.0)
Immature Granulocytes: 3 %
Lymphocytes Relative: 22 %
Lymphs Abs: 0.5 10*3/uL — ABNORMAL LOW (ref 0.7–4.0)
MCH: 30.2 pg (ref 26.0–34.0)
MCHC: 34.9 g/dL (ref 30.0–36.0)
MCV: 86.5 fL (ref 80.0–100.0)
Monocytes Absolute: 0.6 10*3/uL (ref 0.1–1.0)
Monocytes Relative: 26 %
Neutro Abs: 0.8 10*3/uL — ABNORMAL LOW (ref 1.7–7.7)
Neutrophils Relative %: 33 %
Platelets: 184 10*3/uL (ref 150–400)
RBC: 4.53 MIL/uL (ref 4.22–5.81)
RDW: 13.6 % (ref 11.5–15.5)
WBC: 2.4 10*3/uL — ABNORMAL LOW (ref 4.0–10.5)
nRBC: 0 % (ref 0.0–0.2)

## 2020-09-25 LAB — CMP (CANCER CENTER ONLY)
ALT: 26 U/L (ref 0–44)
AST: 36 U/L (ref 15–41)
Albumin: 3.7 g/dL (ref 3.5–5.0)
Alkaline Phosphatase: 62 U/L (ref 38–126)
Anion gap: 10 (ref 5–15)
BUN: 13 mg/dL (ref 8–23)
CO2: 25 mmol/L (ref 22–32)
Calcium: 9.2 mg/dL (ref 8.9–10.3)
Chloride: 108 mmol/L (ref 98–111)
Creatinine: 1.12 mg/dL (ref 0.61–1.24)
GFR, Estimated: >60 mL/min (ref >60)
Glucose, Bld: 77 mg/dL (ref 70–99)
Potassium: 4.1 mmol/L (ref 3.5–5.1)
Sodium: 143 mmol/L (ref 135–145)
Total Bilirubin: 0.5 mg/dL (ref 0.3–1.2)
Total Protein: 6.9 g/dL (ref 6.5–8.1)

## 2020-09-25 NOTE — Progress Notes (Signed)
HEMATOLOGY/ONCOLOGY CONSULTATION NOTE  Date of Service: 09/25/2020  Patient Care Team: Brian Arabian, MD as PCP - General (Family Medicine)  CHIEF COMPLAINTS/PURPOSE OF CONSULTATION:  Continue mx of mantle cell lymphoma  HISTORY OF PRESENTING ILLNESS:   Brian Brian Kaiser is a wonderful 65 y.o. male who has been referred to [place] by Dr. Gaynelle Brian Kaiser for evaluation and management of concern for lymphoma. Pt is accompanied today by his wife, Brian Brian Kaiser. The pt reports that he is doing well overall.    The pt reports that last November he had an Upper Endoscopy because he was having abdominal bloating. Pt was found to have gastritis and was given Protonix and Pepcid to use as needed. Pt has not used Protonix and Pepcid in two months. His abdominal bloating has been intermittent since November, but has been better recently.   Last July pt had surgery on his right forearm and was experiencing difficulty sleeping due to him continuously rolling on the arm. This lasted for some time. Pt had his second COVID19 vaccine in February and his second Shingles vaccine two weeks later. After this he began experiencing fatigue, which is still present. His fatigue is not progressive and he is able to complete his daily task. Pt has drenching night sweats that started two months ago. These do not occur every night. He has lost 15 lbs in the last few months without effort, but started back going to the gym in May. Pt has noticed that left inguinal lymph node has fluctuated in size since he was diagnosed with Hepatitis B nearly 20 years ago. Recently the lymph node has grown in size and become more firm. He has also noticed an increase in the size of a right cervical lymph node. Pt has had left testicular discomfort in the last few weeks. He reports b/l ankle swelling, calf cramping and upper left abdominal pain with certain sleeping positions.  His Hepatitis B was picked up after he was experiencing abdominal discomfort and  was treated with milk thistle. It has been monitored via labwork over the years. Pt had a Basal Cell lesion on his left thigh and back a few months ago. They have been removed.   Of note prior to the patient's visit today, pt has had CT C/A/P ([id number]) completed on 06/06/2020 with results revealing "Extensive adenopathy in the chest, abdomen and pelvis. Marked splenomegaly. Findings most concerning for lymphoma. Small amount of free fluid in the abdomen and pelvis."   Most recent lab results (06/03/2020) of CBC w/diff & CMP is as follows: all values are WNL except for PLT at 135K. 06/03/2020 TSH at 7.86 06/03/2020 PSA at 0.20 06/03/2020 Sed Rate at 1  On review of systems, pt reports night sweats, new lumps/bumps, ankle swelling, calf cramping, constipation, change in color of bowel movements, left testicular pain, fatigue, unexpected weight loss, improving abdominal bloating, early satiety and denies SOB, chest pain, fevers, chills, bloody/black stools, headaches, vision changes, bone pain, back pain, flank pain and any other symptoms.   On PMHx the pt reports GERD, Forearm laceration, Right Forearm Ligament Repair, Hepatitis B, Hyperlipidemia. On Family Hx the pt reports that his brother was diagnosed with DLBCL a year ago.  INTERVAL HISTORY: Brian Brian Kaiser is a wonderful 65 y.o. male who is here for evaluation and management of his Mantle Cell Lymphoma. He is here prior to C4 Bendamustine/Rituxan. The patient's last visit with [place] was on 08/28/2020.He is accompanied here today by his wife. The pt reports that he  is doing well overall.  The pt reports Brian Kaiser new symptoms. He reports that he has continuously been eating better and experiencing an increased appetite. The pt also noted that he has received both his COVID vaccines and booster.  Of note since the patient's last visit, pt has had PET/CT ([id number]) completed on 09/24/2019 with results revealing "1. Complete metabolic response to therapy.  2. Resolution of splenomegaly and splenic hypermetabolism."  Lab results today (09/25/20) of CBC w/diff and CMP is as follows: all values are WNL except for WBC at 2.4K, Neuro Abs at 0.8K,  Lymph Abs at 0.5K .  On review of systems, pt denies fever, chills, night sweats, abdominal pain, leg swelling, skin rashes and any other symptoms.    MEDICAL HISTORY:  Past Medical History:  Diagnosis Date  . Allergy   . Forearm laceration    right forearm  . GERD (gastroesophageal reflux disease)   . Hepatitis B   . Hyperlipidemia   . Pulmonary lesion    Left pulmonary schwannoma  . Schwannoma    left lower lung    SURGICAL HISTORY: Past Surgical History:  Procedure Laterality Date  . COLONOSCOPY    . LIGAMENT REPAIR Right 04/19/2019   Right forearm  . LIPOMA EXCISION Left    neck  . LUNG SURGERY Left   . OTHER SURGICAL HISTORY Left    pulmonary schwannoma excision  . UPPER GASTROINTESTINAL ENDOSCOPY    . WOUND EXPLORATION Right 04/19/2019   Procedure: WOUND EXPLORATION; REPAIR RIGHT FOREARM LACERATION;  Surgeon: Brian Crumb, MD;  Location: [facility];  Service: Orthopedics;  Laterality: Right;  AXILLARY BLOCK AND MAC    SOCIAL HISTORY: Social History   Socioeconomic History  . Marital status: Married    Spouse name: Not on file  . Number of children: Not on file  . Years of education: Not on file  . Highest education level: Not on file  Occupational History  . Not on file  Tobacco Use  . Smoking status: Never Smoker  . Smokeless tobacco: Never Used  Vaping Use  . Vaping Use: Never used  Substance and Sexual Activity  . Alcohol use: Brian Kaiser    Alcohol/week: 0.0 standard drinks  . Drug use: Brian Kaiser  . Sexual activity: Not on file  Other Topics Concern  . Not on file  Social History Narrative  . Not on file   Social Determinants of Health   Financial Resource Strain: Not on file  Food Insecurity: Not on file  Transportation Needs: Not on file  Physical  Activity: Not on file  Stress: Not on file  Social Connections: Not on file  Intimate Partner Violence: Not on file    FAMILY HISTORY: Family History  Problem Relation Age of Onset  . Macular degeneration Mother   . Dementia Mother   . Diverticulitis Mother   . Prostate cancer Paternal Grandfather   . Diverticulitis Brother   . Colon cancer Neg Hx   . Colon polyps Neg Hx   . Esophageal cancer Neg Hx   . Liver cancer Neg Hx   . Pancreatic cancer Neg Hx   . Rectal cancer Neg Hx   . Stomach cancer Neg Hx     ALLERGIES:  is allergic to augmentin [amoxicillin-pot clavulanate].  MEDICATIONS:  Current Outpatient Medications  Medication Sig Dispense Refill  . acetaminophen (TYLENOL) 325 MG tablet Take 325 mg by mouth every 6 (six) hours as needed for moderate pain or headache.    Marland Kitchen  allopurinol (ZYLOPRIM) 100 MG tablet Take 1 tablet (100 mg total) by mouth 2 (two) times daily. 60 tablet 0  . cholecalciferol (VITAMIN D3) 25 MCG (1000 UNIT) tablet Take 1,000 Units by mouth daily.    Marland Kitchen dexamethasone (DECADRON) 4 MG tablet Take 2 tablets (8 mg total) by mouth daily. Start the day after bendamustine chemotherapy for 2 days. Take with food. 30 tablet 1  . famotidine (PEPCID) 20 MG tablet Take 1 tablet (20 mg total) by mouth at bedtime. (Patient taking differently: Take 20 mg by mouth daily as needed for heartburn. ) 30 tablet 3  . LORazepam (ATIVAN) 0.5 MG tablet Take 1 tablet (0.5 mg total) by mouth every 8 (eight) hours as needed for anxiety. 30 tablet 0  . Multiple Vitamin (MULTIVITAMIN) tablet Take 1 tablet by mouth daily.    . Omega-3 Fatty Acids (FISH OIL) 1000 MG CAPS Take 1,000 mg by mouth daily.    . ondansetron (ZOFRAN) 8 MG tablet Take 1 tablet (8 mg total) by mouth 2 (two) times daily as needed for refractory nausea / vomiting. Start on day 2 after bendamustine chemo. 30 tablet 1  . pantoprazole (PROTONIX) 40 MG tablet Take 1 tablet (40 mg total) by mouth daily. 90 tablet 3  .  prochlorperazine (COMPAZINE) 10 MG tablet Take 1 tablet (10 mg total) by mouth every 6 (six) hours as needed (Nausea or vomiting). 30 tablet 1  . vitamin C (ASCORBIC ACID) 250 MG tablet Take 250 mg by mouth daily.     Brian Kaiser current facility-administered medications for this visit.    REVIEW OF SYSTEMS:   A 10+ POINT REVIEW OF SYSTEMS WAS OBTAINED including neurology, dermatology, psychiatry, cardiac, respiratory, lymph, extremities, GI, GU, Musculoskeletal, constitutional, breasts, reproductive, HEENT.  All pertinent positives are noted in the HPI.  All others are negative.   PHYSICAL EXAMINATION: ECOG PERFORMANCE STATUS: 1 - Symptomatic but completely ambulatory  . Vitals:   09/25/20 1442  BP: (!) 146/93  Pulse: 73  Resp: 18  Temp: 98.1 F (36.7 C)  SpO2: 100%   Filed Weights   09/25/20 1442  Weight: 182 lb 3.2 oz (82.6 kg)   .Body mass index is 26.91 kg/m.   GENERAL:alert, in Brian Kaiser acute distress and comfortable SKIN: Brian Kaiser acute rashes, Brian Kaiser significant lesions EYES: conjunctiva are pink and non-injected, sclera anicteric OROPHARYNX: MMM, Brian Kaiser exudates, Brian Kaiser oropharyngeal erythema or ulceration NECK: supple, Brian Kaiser JVD LYMPH:  Brian Kaiser palpable lymphadenopathy in the cervical, axillary or inguinal regions LUNGS: clear to auscultation b/l with normal respiratory effort HEART: regular rate & rhythm ABDOMEN:  normoactive bowel sounds , non tender, not distended. Brian Kaiser palpable hepatosplenomegaly.  Extremity: Brian Kaiser pedal edema PSYCH: alert & oriented x 3 with fluent speech NEURO: Brian Kaiser focal motor/sensory deficits  LABORATORY DATA:  I have reviewed the data as listed  . CBC Latest Ref Rng & Units 09/25/2020 08/28/2020 07/31/2020  WBC 4.0 - 10.5 K/uL 2.4(L) 5.0 4.6  Hemoglobin 13.0 - 17.0 g/dL 13.7 13.4 13.1  Hematocrit 39.0 - 52.0 % 39.2 38.7(L) 38.5(L)  Platelets 150 - 400 K/uL 184 167 154   ANC 800 . CMP Latest Ref Rng & Units 09/25/2020 08/28/2020 07/31/2020  Glucose 70 - 99 mg/dL 77 94 81  BUN 8 -  23 mg/dL _0 Creatinine 0.61 - 1.24 mg/dL 1.12 1.10 1.08  Sodium 135 - 145 mmol/L 143 142 144  Potassium 3.5 - 5.1 mmol/L 4.1 4.2 4.2  Chloride 98 - 111 mmol/L 108 107 108  CO2 22 - 32 mmol/L _0 Calcium 8.9 - 10.3 mg/dL 9.2 9.3 9.1  Total Protein 6.5 - 8.1 g/dL 6.9 6.5 6.4(L)  Total Bilirubin 0.3 - 1.2 mg/dL 0.5 0.3 0.6  Alkaline Phos 38 - 126 U/L 62 53 53  AST 15 - 41 U/L 36 25 22  ALT 0 - 44 U/L _1 06/20/2020 Right Axillary Surgical Pathology Report [id number]):    06/20/2020 Right Axillary Lymph Node Flow Pathology ([id number]):   06/11/2020 Flow Pathology ([id number]):   RADIOGRAPHIC STUDIES: I have personally reviewed the radiological images as listed and agreed with the findings in the report. NM PET Image Restag (PS) Skull Base To Thigh  Result Date: 09/23/2020 CLINICAL DATA:  Subsequent treatment strategy for mantle cell lymphoma. Evaluate after 3 cycles of chemotherapy. EXAM: NUCLEAR MEDICINE PET SKULL BASE TO THIGH TECHNIQUE: 8.9 mCi F-18 FDG was injected intravenously. Full-ring PET imaging was performed from the skull base to thigh after the radiotracer. CT data was obtained and used for attenuation correction and anatomic localization. Fasting blood glucose: 99 mg/dl COMPARISON:  06/19/2020 FINDINGS: Mediastinal blood pool activity: SUV max 2.4 Liver activity: SUV max 3.7 NECK: Resolution of previously described hypermetabolic cervical nodes. Incidental CT findings: Brian Kaiser cervical adenopathy. CHEST: Brian Kaiser residual thoracic nodal and Brian Kaiser pulmonary parenchymal hypermetabolism. Incidental CT findings: Brian Kaiser residual thoracic adenopathy. Clear lungs. ABDOMEN/PELVIS: Interval resolution of abdominopelvic nodal hypermetabolism. Brian Kaiser residual splenic hypermetabolism. Incidental CT findings: Resolution of splenomegaly. The spleen measures 12.2 cm in greatest transverse dimension today versus 17.6 cm on the prior exam. Normal adrenal glands.  Brian Kaiser residual abdominopelvic  adenopathy. Abdominal aortic atherosclerosis. SKELETON: Brian Kaiser abnormal marrow activity. Incidental CT findings: none IMPRESSION: 1. Complete metabolic response to therapy. 2. Resolution of splenomegaly and splenic hypermetabolism. 3.  Aortic Atherosclerosis (ICD10-I70.0). Electronically Signed   By: Abigail Miyamoto M.D.   On: 09/23/2020 09:06    ASSESSMENT & PLAN:   65 yo with   1) Newly diagnosed stage IV mantle cell lymphoma with extensive lymphadenopathy and massive splenomegaly . 2) massive splenomegaly likely related to mantle cell lymphoma  3) thrombocytopenia-mild platelets of 132k likely related to lymphoma. 4) hepatitis B core antibody positive, surface antigen negative, HBV DNA PCR negative 5) elevated LDH due to lymphoma PLAN: -Discussed pt labwork today, 09/25/20; WBC are borderline low, all blood chemistries WNL -Discussed 09/24/2019; PET/CT revealed Complete metabolic response to therapy and Resolution of splenomegaly and splenic hypermetabolism - Brian Kaiser visible lymphoma. -The pt has Brian Kaiser prohibitive toxicities from continuing C4D1 Bendamustine + Rituxan tomorrow. -Recomend at least two cycles following complete response (5 total) but would prefer for pt to finish all six cycles.  -Advised pt that we need to add Udenyca on D4 due to neutropenia to boost WBC count for the next cycle. -Advised pt of bone pains and aches that may result for 1-2 days following the Udenyca shot.  -Recommend taking Claritin to block discomfort from Udenyca shot.   -Advised pt that our goal is to get the deepest response for extended progression-free period.  -Discussed continuing maintenance Rituxan vs completing transplant after induction treatment - to be decided.  -Repeat PET/CT following C6. -Will see back 1 day prior to C5    FOLLOW UP: -Add C4D4 for Udenyca on 1/10 -PLz schedule C5 of treatment (D1,D2 and D4) as ordered at [place] per patients preference. -MD visit with portflush and labs 1 day  prior to treatment   The total time spent in the appt  was 30 minutes and more than 50% was on counseling and direct patient cares, ordering, managing and co-ordinating treatment.  All of the patient's questions were answered with apparent satisfaction. The patient knows to call the clinic with any problems, questions or concerns.    Sullivan Lone MD [place] AAHIVMS [facility] Hematology/Oncology Physician [facility]  (Office):       [phone] (Work cell):  [phone] (Fax):           [phone]  09/25/2020 3:39 PM  I, Yevette Edwards, am acting as a scribe for Dr. Sullivan Lone.   .I have reviewed the above documentation for accuracy and completeness, and I agree with the above. Brunetta Genera MD

## 2020-09-26 ENCOUNTER — Telehealth: Payer: Self-pay | Admitting: *Deleted

## 2020-09-26 ENCOUNTER — Inpatient Hospital Stay: Payer: 59

## 2020-09-26 VITALS — BP 135/93 | HR 62 | Temp 98.1°F | Resp 18

## 2020-09-26 DIAGNOSIS — R7402 Elevation of levels of lactic acid dehydrogenase (LDH): Secondary | ICD-10-CM | POA: Diagnosis not present

## 2020-09-26 DIAGNOSIS — Z7189 Other specified counseling: Secondary | ICD-10-CM

## 2020-09-26 DIAGNOSIS — Z23 Encounter for immunization: Secondary | ICD-10-CM | POA: Diagnosis not present

## 2020-09-26 DIAGNOSIS — D696 Thrombocytopenia, unspecified: Secondary | ICD-10-CM | POA: Diagnosis not present

## 2020-09-26 DIAGNOSIS — Z5112 Encounter for antineoplastic immunotherapy: Secondary | ICD-10-CM | POA: Diagnosis not present

## 2020-09-26 DIAGNOSIS — C8314 Mantle cell lymphoma, lymph nodes of axilla and upper limb: Secondary | ICD-10-CM | POA: Diagnosis not present

## 2020-09-26 DIAGNOSIS — Z5111 Encounter for antineoplastic chemotherapy: Secondary | ICD-10-CM | POA: Diagnosis not present

## 2020-09-26 DIAGNOSIS — C8318 Mantle cell lymphoma, lymph nodes of multiple sites: Secondary | ICD-10-CM

## 2020-09-26 DIAGNOSIS — R160 Hepatomegaly, not elsewhere classified: Secondary | ICD-10-CM | POA: Diagnosis not present

## 2020-09-26 DIAGNOSIS — Z5189 Encounter for other specified aftercare: Secondary | ICD-10-CM | POA: Diagnosis not present

## 2020-09-26 DIAGNOSIS — Z79899 Other long term (current) drug therapy: Secondary | ICD-10-CM | POA: Diagnosis not present

## 2020-09-26 MED ORDER — SODIUM CHLORIDE 0.9 % IV SOLN
375.0000 mg/m2 | Freq: Once | INTRAVENOUS | Status: AC
  Administered 2020-09-26: 700 mg via INTRAVENOUS
  Filled 2020-09-26: qty 50

## 2020-09-26 MED ORDER — FAMOTIDINE IN NACL 20-0.9 MG/50ML-% IV SOLN
20.0000 mg | Freq: Once | INTRAVENOUS | Status: AC
  Administered 2020-09-26: 20 mg via INTRAVENOUS

## 2020-09-26 MED ORDER — PALONOSETRON HCL INJECTION 0.25 MG/5ML
0.2500 mg | Freq: Once | INTRAVENOUS | Status: AC
  Administered 2020-09-26: 0.25 mg via INTRAVENOUS

## 2020-09-26 MED ORDER — SODIUM CHLORIDE 0.9 % IV SOLN
90.0000 mg/m2 | Freq: Once | INTRAVENOUS | Status: AC
  Administered 2020-09-26: 175 mg via INTRAVENOUS
  Filled 2020-09-26: qty 7

## 2020-09-26 MED ORDER — FAMOTIDINE IN NACL 20-0.9 MG/50ML-% IV SOLN
INTRAVENOUS | Status: AC
  Filled 2020-09-26: qty 50

## 2020-09-26 MED ORDER — SODIUM CHLORIDE 0.9 % IV SOLN
Freq: Once | INTRAVENOUS | Status: AC
  Filled 2020-09-26: qty 250

## 2020-09-26 MED ORDER — DIPHENHYDRAMINE HCL 25 MG PO CAPS
ORAL_CAPSULE | ORAL | Status: AC
  Filled 2020-09-26: qty 1

## 2020-09-26 MED ORDER — ACETAMINOPHEN 325 MG PO TABS
650.0000 mg | ORAL_TABLET | Freq: Once | ORAL | Status: AC
  Administered 2020-09-26: 650 mg via ORAL

## 2020-09-26 MED ORDER — SODIUM CHLORIDE 0.9 % IV SOLN
10.0000 mg | Freq: Once | INTRAVENOUS | Status: AC
  Administered 2020-09-26: 10 mg via INTRAVENOUS
  Filled 2020-09-26: qty 10

## 2020-09-26 MED ORDER — ACETAMINOPHEN 325 MG PO TABS
ORAL_TABLET | ORAL | Status: AC
  Filled 2020-09-26: qty 2

## 2020-09-26 MED ORDER — DIPHENHYDRAMINE HCL 25 MG PO CAPS
50.0000 mg | ORAL_CAPSULE | Freq: Once | ORAL | Status: AC
  Administered 2020-09-26: 50 mg via ORAL

## 2020-09-26 MED ORDER — PALONOSETRON HCL INJECTION 0.25 MG/5ML
INTRAVENOUS | Status: AC
  Filled 2020-09-26: qty 5

## 2020-09-26 NOTE — Patient Instructions (Addendum)
Famotidine injection What is this medicine? FAMOTIDINE (fa MOE ti deen) is a type of antihistamine that blocks the release of stomach acid. It is used to treat stomach or intestinal ulcers. It can relieve ulcer pain and discomfort, and the heartburn from acid reflux. This medicine may be used for other purposes; ask your health care provider or pharmacist if you have questions. COMMON BRAND NAME(S): Pepcid What should I tell my health care provider before I take this medicine? They need to know if you have any of these conditions:  kidney or liver disease  an unusual or allergic reaction to famotidine, other medicines, foods, dyes, or preservatives  pregnant or trying to get pregnant  breast-feeding How should I use this medicine? This medicine is for infusion into a vein. It is given by a health care professional in a hospital or clinic setting. Talk to your pediatrician regarding the use of this medicine in children. Special care may be needed. Overdosage: If you think you have taken too much of this medicine contact a poison control center or emergency room at once. NOTE: This medicine is only for you. Do not share this medicine with others. What if I miss a dose? This does not apply. What may interact with this medicine?  delavirdine  itraconazole  ketoconazole This list may not describe all possible interactions. Give your health care provider a list of all the medicines, herbs, non-prescription drugs, or dietary supplements you use. Also tell them if you smoke, drink alcohol, or use illegal drugs. Some items may interact with your medicine. What should I watch for while using this medicine? Tell your doctor or health care professional if your condition does not start to get better or gets worse. Do not take with aspirin, ibuprofen, or other antiinflammatory medicines. These can aggravate your condition. Do not smoke cigarettes or drink alcohol. These increase irritation in your  stomach and can increase the time it will take for ulcers to heal. Cigarettes and alcohol can also worsen acid reflux or heartburn. If you get black, tarry stools or vomit up what looks like coffee grounds, call your doctor or health care professional at once. You may have a bleeding ulcer. This medicine may cause a decrease in vitamin B12. You should make sure that you get enough vitamin B12 while you are taking this medicine. Discuss the foods you eat and the vitamins you take with your health care professional. What side effects may I notice from receiving this medicine? Side effects that you should report to your doctor or health care professional as soon as possible:  allergic reactions like skin rash, itching or hives, swelling of the face, lips, or tongue  agitation, nervousness  confusion  hallucinations Side effects that usually do not require medical attention (report to your doctor or health care professional if they continue or are bothersome):  constipation  diarrhea  dizziness  headache This list may not describe all possible side effects. Call your doctor for medical advice about side effects. You may report side effects to FDA at 1-800-FDA-1088. Where should I keep my medicine? This medicine is given in a hospital or clinic. You will not be given this medicine to store at home. NOTE: This sheet is a summary. It may not cover all possible information. If you have questions about this medicine, talk to your doctor, pharmacist, or health care provider.  2020 Elsevier/Gold Standard (2017-04-23 13:16:46) Dexamethasone injection What is this medicine? DEXAMETHASONE (dex a METH a sone) is a  corticosteroid. It is used to treat inflammation of the skin, joints, lungs, and other organs. Common conditions treated include asthma, allergies, and arthritis. It is also used for other conditions, like blood disorders and diseases of the adrenal glands. This medicine may be used for  other purposes; ask your health care provider or pharmacist if you have questions. COMMON BRAND NAME(S): Decadron, DoubleDex, Simplist Dexamethasone, Solurex What should I tell my health care provider before I take this medicine? They need to know if you have any of these conditions:  Cushing's syndrome  diabetes  glaucoma  heart disease  high blood pressure  infection like herpes, measles, tuberculosis, or chickenpox  kidney disease  liver disease  mental illness  myasthenia gravis  osteoporosis  previous heart attack  seizures  stomach or intestine problems  thyroid disease  an unusual or allergic reaction to dexamethasone, corticosteroids, other medicines, lactose, foods, dyes, or preservatives  pregnant or trying to get pregnant  breast-feeding How should I use this medicine? This medicine is for injection into a muscle, joint, lesion, soft tissue, or vein. It is given by a health care professional in a hospital or clinic setting. Talk to your pediatrician regarding the use of this medicine in children. Special care may be needed. Overdosage: If you think you have taken too much of this medicine contact a poison control center or emergency room at once. NOTE: This medicine is only for you. Do not share this medicine with others. What if I miss a dose? This may not apply. If you are having a series of injections over a prolonged period, try not to miss an appointment. Call your doctor or health care professional to reschedule if you are unable to keep an appointment. What may interact with this medicine? Do not take this medicine with any of the following medications:  live virus vaccines This medicine may also interact with the following medications:  aminoglutethimide  amphotericin B  aspirin and aspirin-like medicines  certain antibiotics like erythromycin, clarithromycin, and troleandomycin  certain antivirals for HIV or hepatitis  certain  medicines for seizures like carbamazepine, phenobarbital, phenytoin  certain medicines to treat myasthenia gravis  cholestyramine  cyclosporine  digoxin  diuretics  ephedrine  male hormones, like estrogen or progestins and birth control pills  insulin or other medicines for diabetes  isoniazid  ketoconazole  medicines that relax muscles for surgery  mifepristone  NSAIDs, medicines for pain and inflammation, like ibuprofen or naproxen  rifampin  skin tests for allergies  thalidomide  vaccines  warfarin This list may not describe all possible interactions. Give your health care provider a list of all the medicines, herbs, non-prescription drugs, or dietary supplements you use. Also tell them if you smoke, drink alcohol, or use illegal drugs. Some items may interact with your medicine. What should I watch for while using this medicine? Visit your health care professional for regular checks on your progress. Tell your health care professional if your symptoms do not start to get better or if they get worse. Your condition will be monitored carefully while you are receiving this medicine. Wear a medical ID bracelet or chain. Carry a card that describes your disease and details of your medicine and dosage times. This medicine may increase your risk of getting an infection. Call your health care professional for advice if you get a fever, chills, or sore throat, or other symptoms of a cold or flu. Do not treat yourself. Try to avoid being around people who are  sick. Call your health care professional if you are around anyone with measles, chickenpox, or if you develop sores or blisters that do not heal properly. If you are going to need surgery or other procedures, tell your doctor or health care professional that you have taken this medicine within the last 12 months. Ask your doctor or health care professional about your diet. You may need to lower the amount of salt you  eat. This medicine may increase blood sugar. Ask your healthcare provider if changes in diet or medicines are needed if you have diabetes. What side effects may I notice from receiving this medicine? Side effects that you should report to your doctor or health care professional as soon as possible:  allergic reactions like skin rash, itching or hives, swelling of the face, lips, or tongue  bloody or black, tarry stools  changes in emotions or moods  changes in vision  confusion, excitement, restlessness  depressed mood  eye pain  hallucinations  muscle weakness  severe or sudden stomach or belly pain  signs and symptoms of high blood sugar such as being more thirsty or hungry or having to urinate more than normal. You may also feel very tired or have blurry vision.  signs and symptoms of infection like fever; chills; cough; sore throat; pain or trouble passing urine  swelling of ankles, feet  unusual bruising or bleeding  wounds that do not heal Side effects that usually do not require medical attention (report to your doctor or health care professional if they continue or are bothersome):  increased appetite  increased growth of face or body hair  headache  nausea, vomiting  pain, redness, or irritation at site where injected  skin problems, acne, thin and shiny skin  trouble sleeping  weight gain This list may not describe all possible side effects. Call your doctor for medical advice about side effects. You may report side effects to FDA at 1-800-FDA-1088. Where should I keep my medicine? This medicine is given in a hospital or clinic and will not be stored at home. NOTE: This sheet is a summary. It may not cover all possible information. If you have questions about this medicine, talk to your doctor, pharmacist, or health care provider.  2020 Elsevier/Gold Standard (2019-03-21 13:51:58) Acetaminophen tablets or caplets What is this  medicine? ACETAMINOPHEN (a set a MEE noe fen) is a pain reliever. It is used to treat mild pain and fever. This medicine may be used for other purposes; ask your health care provider or pharmacist if you have questions. COMMON BRAND NAME(S): Aceta, Actamin, Anacin Aspirin Free, Genapap, Genebs, Mapap, Pain & Fever, Pain and Fever, PAIN RELIEF, PAIN RELIEF Extra Strength, Pain Reliever, Panadol, PHARBETOL, Q-Pap, Q-Pap Extra Strength, Tylenol, Tylenol CrushableTablet, Tylenol Extra Strength, XS No Aspirin, XS Pain Reliever What should I tell my health care provider before I take this medicine? They need to know if you have any of these conditions:  if you often drink alcohol  liver disease  an unusual or allergic reaction to acetaminophen, other medicines, foods, dyes, or preservatives  pregnant or trying to get pregnant  breast-feeding How should I use this medicine? Take this medicine by mouth with a glass of water. Follow the directions on the package or prescription label. Take your medicine at regular intervals. Do not take your medicine more often than directed. Talk to your pediatrician regarding the use of this medicine in children. While this drug may be prescribed for children as young  as 72 years of age for selected conditions, precautions do apply. Overdosage: If you think you have taken too much of this medicine contact a poison control center or emergency room at once. NOTE: This medicine is only for you. Do not share this medicine with others. What if I miss a dose? If you miss a dose, take it as soon as you can. If it is almost time for your next dose, take only that dose. Do not take double or extra doses. What may interact with this medicine?  alcohol  imatinib  isoniazid  other medicines with acetaminophen This list may not describe all possible interactions. Give your health care provider a list of all the medicines, herbs, non-prescription drugs, or dietary  supplements you use. Also tell them if you smoke, drink alcohol, or use illegal drugs. Some items may interact with your medicine. What should I watch for while using this medicine? Tell your doctor or health care professional if the pain lasts more than 10 days (5 days for children), if it gets worse, or if there is a new or different kind of pain. Also, check with your doctor if a fever lasts for more than 3 days. Do not take other medicines that contain acetaminophen with this medicine. Always read labels carefully. If you have questions, ask your doctor or pharmacist. If you take too much acetaminophen get medical help right away. Too much acetaminophen can be very dangerous and cause liver damage. Even if you do not have symptoms, it is important to get help right away. What side effects may I notice from receiving this medicine? Side effects that you should report to your doctor or health care professional as soon as possible:  allergic reactions like skin rash, itching or hives, swelling of the face, lips, or tongue  breathing problems  fever or sore throat  redness, blistering, peeling or loosening of the skin, including inside the mouth  trouble passing urine or change in the amount of urine  unusual bleeding or bruising  unusually weak or tired  yellowing of the eyes or skin Side effects that usually do not require medical attention (report to your doctor or health care professional if they continue or are bothersome):  headache  nausea, stomach upset This list may not describe all possible side effects. Call your doctor for medical advice about side effects. You may report side effects to FDA at 1-800-FDA-1088. Where should I keep my medicine? Keep out of reach of children. Store at room temperature between 20 and 25 degrees C (68 and 77 degrees F). Protect from moisture and heat. Throw away any unused medicine after the expiration date. NOTE: This sheet is a summary. It  may not cover all possible information. If you have questions about this medicine, talk to your doctor, pharmacist, or health care provider.  2020 Elsevier/Gold Standard (2013-05-01 12:54:16) Diphenhydramine capsules or tablets What is this medicine? DIPHENHYDRAMINE (dye fen HYE dra meen) is an antihistamine. It is used to treat the symptoms of an allergic reaction. It is also used to treat Parkinson's disease. This medicine is also used to prevent and to treat motion sickness and as a nighttime sleep aid. This medicine may be used for other purposes; ask your health care provider or pharmacist if you have questions. COMMON BRAND NAME(S): Alka-Seltzer Plus Allergy, Aller-G-Time, Banophen, Benadryl Allergy, Benadryl Allergy Dye Free, Benadryl Allergy Kapgel, Benadryl Allergy Ultratab, Diphedryl, Diphenhist, Genahist, Geri-Dryl, PHARBEDRYL, Q-Dryl, Gretta Began, Valu-Dryl, Vicks ZzzQuil Nightime Sleep-Aid What should I  tell my health care provider before I take this medicine? They need to know if you have any of these conditions:  asthma or lung disease  glaucoma  high blood pressure or heart disease  liver disease  pain or difficulty passing urine  prostate trouble  ulcers or other stomach problems  an unusual or allergic reaction to diphenhydramine, other medicines foods, dyes, or preservatives such as sulfites  pregnant or trying to get pregnant  breast-feeding How should I use this medicine? Take this medicine by mouth with a full glass of water. Follow the directions on the prescription label. Take your doses at regular intervals. Do not take your medicine more often than directed. To prevent motion sickness start taking this medicine 30 to 60 minutes before you leave. Talk to your pediatrician regarding the use of this medicine in children. Special care may be needed. Patients over 21 years old may have a stronger reaction and need a smaller dose. Overdosage: If you think you have  taken too much of this medicine contact a poison control center or emergency room at once. NOTE: This medicine is only for you. Do not share this medicine with others. What if I miss a dose? If you miss a dose, take it as soon as you can. If it is almost time for your next dose, take only that dose. Do not take double or extra doses. What may interact with this medicine? Do not take this medicine with any of the following medications:  MAOIs like Carbex, Eldepryl, Marplan, Nardil, and Parnate This medicine may also interact with the following medications:  alcohol  barbiturates, like phenobarbital  medicines for bladder spasm like oxybutynin, tolterodine  medicines for blood pressure  medicines for depression, anxiety, or psychotic disturbances  medicines for movement abnormalities or Parkinson's disease  medicines for sleep  other medicines for cold, cough or allergy  some medicines for the stomach like chlordiazepoxide, dicyclomine This list may not describe all possible interactions. Give your health care provider a list of all the medicines, herbs, non-prescription drugs, or dietary supplements you use. Also tell them if you smoke, drink alcohol, or use illegal drugs. Some items may interact with your medicine. What should I watch for while using this medicine? Visit your doctor or health care professional for regular check ups. Tell your doctor if your symptoms do not improve or if they get worse. Your mouth may get dry. Chewing sugarless gum or sucking hard candy, and drinking plenty of water may help. Contact your doctor if the problem does not go away or is severe. This medicine may cause dry eyes and blurred vision. If you wear contact lenses you may feel some discomfort. Lubricating drops may help. See your eye doctor if the problem does not go away or is severe. You may get drowsy or dizzy. Do not drive, use machinery, or do anything that needs mental alertness until you  know how this medicine affects you. Do not stand or sit up quickly, especially if you are an older patient. This reduces the risk of dizzy or fainting spells. Alcohol may interfere with the effect of this medicine. Avoid alcoholic drinks. What side effects may I notice from receiving this medicine? Side effects that you should report to your doctor or health care professional as soon as possible:  allergic reactions like skin rash, itching or hives, swelling of the face, lips, or tongue  changes in vision  confused, agitated, nervous  irregular or fast heartbeat  tremor  trouble passing urine  unusual bleeding or bruising  unusually weak or tired Side effects that usually do not require medical attention (report to your doctor or health care professional if they continue or are bothersome):  constipation, diarrhea  drowsy  headache  loss of appetite  stomach upset, vomiting  thick mucous This list may not describe all possible side effects. Call your doctor for medical advice about side effects. You may report side effects to FDA at 1-800-FDA-1088. Where should I keep my medicine? Keep out of the reach of children. This medicine can be abused. Keep your medicine in a safe place. Store at room temperature between 15 and 30 degrees C (59 and 86 degrees F). Keep container closed tightly. Throw away any unused medicine after the expiration date. NOTE: This sheet is a summary. It may not cover all possible information. If you have questions about this medicine, talk to your doctor, pharmacist, or health care provider.  2020 Elsevier/Gold Standard (2019-06-16 10:18:35) Palonosetron Injection What is this medicine? PALONOSETRON (pal oh NOE se tron) is used to prevent nausea and vomiting caused by chemotherapy. It also helps prevent delayed nausea and vomiting that may occur a few days after your treatment. This medicine may be used for other purposes; ask your health care provider  or pharmacist if you have questions. COMMON BRAND NAME(S): Aloxi What should I tell my health care provider before I take this medicine? They need to know if you have any of these conditions:  an unusual or allergic reaction to palonosetron, dolasetron, granisetron, ondansetron, other medicines, foods, dyes, or preservatives  pregnant or trying to get pregnant  breast-feeding How should I use this medicine? This medicine is for infusion into a vein. It is given by a health care professional in a hospital or clinic setting. Talk to your pediatrician regarding the use of this medicine in children. While this drug may be prescribed for children as young as 1 month for selected conditions, precautions do apply. Overdosage: If you think you have taken too much of this medicine contact a poison control center or emergency room at once. NOTE: This medicine is only for you. Do not share this medicine with others. What if I miss a dose? This does not apply. What may interact with this medicine?  certain medicines for depression, anxiety, or psychotic disturbances  fentanyl  linezolid  MAOIs like Carbex, Eldepryl, Marplan, Nardil, and Parnate  methylene blue (injected into a vein)  tramadol This list may not describe all possible interactions. Give your health care provider a list of all the medicines, herbs, non-prescription drugs, or dietary supplements you use. Also tell them if you smoke, drink alcohol, or use illegal drugs. Some items may interact with your medicine. What should I watch for while using this medicine? Your condition will be monitored carefully while you are receiving this medicine. What side effects may I notice from receiving this medicine? Side effects that you should report to your doctor or health care professional as soon as possible:  allergic reactions like skin rash, itching or hives, swelling of the face, lips, or tongue  breathing  problems  confusion  dizziness  fast, irregular heartbeat  fever and chills  loss of balance or coordination  seizures  sweating  swelling of the hands and feet  tremors  unusually weak or tired Side effects that usually do not require medical attention (report to your doctor or health care professional if they continue or are bothersome):  constipation or  diarrhea  headache This list may not describe all possible side effects. Call your doctor for medical advice about side effects. You may report side effects to FDA at 1-800-FDA-1088. Where should I keep my medicine? This drug is given in a hospital or clinic and will not be stored at home. NOTE: This sheet is a summary. It may not cover all possible information. If you have questions about this medicine, talk to your doctor, pharmacist, or health care provider.  2020 Elsevier/Gold Standard (2013-07-14 10:38:36) Bendamustine Injection What is this medicine? BENDAMUSTINE (BEN da MUS teen) is a chemotherapy drug. It is used to treat chronic lymphocytic leukemia and non-Hodgkin lymphoma. This medicine may be used for other purposes; ask your health care provider or pharmacist if you have questions. COMMON BRAND NAME(S): Kristine Royal, Treanda What should I tell my health care provider before I take this medicine? They need to know if you have any of these conditions:  infection (especially a virus infection such as chickenpox, cold sores, or herpes)  kidney disease  liver disease  an unusual or allergic reaction to bendamustine, mannitol, other medicines, foods, dyes, or preservatives  pregnant or trying to get pregnant  breast-feeding How should I use this medicine? This medicine is for infusion into a vein. It is given by a health care professional in a hospital or clinic setting. Talk to your pediatrician regarding the use of this medicine in children. Special care may be needed. Overdosage: If you think you  have taken too much of this medicine contact a poison control center or emergency room at once. NOTE: This medicine is only for you. Do not share this medicine with others. What if I miss a dose? It is important not to miss your dose. Call your doctor or health care professional if you are unable to keep an appointment. What may interact with this medicine? Do not take this medicine with any of the following medications:  clozapine This medicine may also interact with the following medications:  atazanavir  cimetidine  ciprofloxacin  enoxacin  fluvoxamine  medicines for seizures like carbamazepine and phenobarbital  mexiletine  rifampin  tacrine  thiabendazole  zileuton This list may not describe all possible interactions. Give your health care provider a list of all the medicines, herbs, non-prescription drugs, or dietary supplements you use. Also tell them if you smoke, drink alcohol, or use illegal drugs. Some items may interact with your medicine. What should I watch for while using this medicine? This drug may make you feel generally unwell. This is not uncommon, as chemotherapy can affect healthy cells as well as cancer cells. Report any side effects. Continue your course of treatment even though you feel ill unless your doctor tells you to stop. You may need blood work done while you are taking this medicine. Call your doctor or healthcare provider for advice if you get a fever, chills or sore throat, or other symptoms of a cold or flu. Do not treat yourself. This drug decreases your body's ability to fight infections. Try to avoid being around people who are sick. This medicine may cause serious skin reactions. They can happen weeks to months after starting the medicine. Contact your healthcare provider right away if you notice fevers or flu-like symptoms with a rash. The rash may be red or purple and then turn into blisters or peeling of the skin. Or, you might notice a  red rash with swelling of the face, lips or lymph nodes in your neck  or under your arms. This medicine may increase your risk to bruise or bleed. Call your doctor or healthcare provider if you notice any unusual bleeding. Talk to your doctor about your risk of cancer. You may be more at risk for certain types of cancers if you take this medicine. Do not become pregnant while taking this medicine or for at least 6 months after stopping it. Women should inform their doctor if they wish to become pregnant or think they might be pregnant. Men should not father a child while taking this medicine and for at least 3 months after stopping it. There is a potential for serious side effects to an unborn child. Talk to your healthcare provider or pharmacist for more information. Do not breast-feed an infant while taking this medicine or for at least 1 week after stopping it. This medicine may make it more difficult to father a child. You should talk with your doctor or healthcare provider if you are concerned about your fertility. What side effects may I notice from receiving this medicine? Side effects that you should report to your doctor or health care professional as soon as possible:  allergic reactions like skin rash, itching or hives, swelling of the face, lips, or tongue  low blood counts - this medicine may decrease the number of white blood cells, red blood cells and platelets. You may be at increased risk for infections and bleeding.  rash, fever, and swollen lymph nodes  redness, blistering, peeling, or loosening of the skin, including inside the mouth  signs of infection like fever or chills, cough, sore throat, pain or difficulty passing urine  signs of decreased platelets or bleeding like bruising, pinpoint red spots on the skin, black, tarry stools, blood in the urine  signs of decreased red blood cells like being unusually weak or tired, fainting spells, lightheadedness  signs and symptoms  of kidney injury like trouble passing urine or change in the amount of urine  signs and symptoms of liver injury like dark yellow or brown urine; general ill feeling or flu-like symptoms; light-colored stools; loss of appetite; nausea; right upper belly pain; unusually weak or tired; yellowing of the eyes or skin Side effects that usually do not require medical attention (report to your doctor or health care professional if they continue or are bothersome):  constipation  decreased appetite  diarrhea  headache  mouth sores  nausea, vomiting  tiredness This list may not describe all possible side effects. Call your doctor for medical advice about side effects. You may report side effects to FDA at 1-800-FDA-1088. Where should I keep my medicine? This drug is given in a hospital or clinic and will not be stored at home. NOTE: This sheet is a summary. It may not cover all possible information. If you have questions about this medicine, talk to your doctor, pharmacist, or health care provider.  2020 Elsevier/Gold Standard (2018-11-29 10:26:46) Rituximab injection What is this medicine? RITUXIMAB (ri TUX i mab) is a monoclonal antibody. It is used to treat certain types of cancer like non-Hodgkin lymphoma and chronic lymphocytic leukemia. It is also used to treat rheumatoid arthritis, granulomatosis with polyangiitis (or Wegener's granulomatosis), microscopic polyangiitis, and pemphigus vulgaris. This medicine may be used for other purposes; ask your health care provider or pharmacist if you have questions. COMMON BRAND NAME(S): Rituxan, RUXIENCE What should I tell my health care provider before I take this medicine? They need to know if you have any of these conditions:  heart  disease  infection (especially a virus infection such as hepatitis B, chickenpox, cold sores, or herpes)  immune system problems  irregular heartbeat  kidney disease  low blood counts, like low white cell,  platelet, or red cell counts  lung or breathing disease, like asthma  recently received or scheduled to receive a vaccine  an unusual or allergic reaction to rituximab, other medicines, foods, dyes, or preservatives  pregnant or trying to get pregnant  breast-feeding How should I use this medicine? This medicine is for infusion into a vein. It is administered in a hospital or clinic by a specially trained health care professional. A special MedGuide will be given to you by the pharmacist with each prescription and refill. Be sure to read this information carefully each time. Talk to your pediatrician regarding the use of this medicine in children. This medicine is not approved for use in children. Overdosage: If you think you have taken too much of this medicine contact a poison control center or emergency room at once. NOTE: This medicine is only for you. Do not share this medicine with others. What if I miss a dose? It is important not to miss a dose. Call your doctor or health care professional if you are unable to keep an appointment. What may interact with this medicine?  cisplatin  live virus vaccines This list may not describe all possible interactions. Give your health care provider a list of all the medicines, herbs, non-prescription drugs, or dietary supplements you use. Also tell them if you smoke, drink alcohol, or use illegal drugs. Some items may interact with your medicine. What should I watch for while using this medicine? Your condition will be monitored carefully while you are receiving this medicine. You may need blood work done while you are taking this medicine. This medicine can cause serious allergic reactions. To reduce your risk you may need to take medicine before treatment with this medicine. Take your medicine as directed. In some patients, this medicine may cause a serious brain infection that may cause death. If you have any problems seeing, thinking,  speaking, walking, or standing, tell your healthcare professional right away. If you cannot reach your healthcare professional, urgently seek other source of medical care. Call your doctor or health care professional for advice if you get a fever, chills or sore throat, or other symptoms of a cold or flu. Do not treat yourself. This drug decreases your body's ability to fight infections. Try to avoid being around people who are sick. Do not become pregnant while taking this medicine or for at least 12 months after stopping it. Women should inform their doctor if they wish to become pregnant or think they might be pregnant. There is a potential for serious side effects to an unborn child. Talk to your health care professional or pharmacist for more information. Do not breast-feed an infant while taking this medicine or for at least 6 months after stopping it. What side effects may I notice from receiving this medicine? Side effects that you should report to your doctor or health care professional as soon as possible:  allergic reactions like skin rash, itching or hives; swelling of the face, lips, or tongue  breathing problems  chest pain  changes in vision  diarrhea  headache with fever, neck stiffness, sensitivity to light, nausea, or confusion  fast, irregular heartbeat  loss of memory  low blood counts - this medicine may decrease the number of white blood cells, red blood cells  and platelets. You may be at increased risk for infections and bleeding.  mouth sores  problems with balance, talking, or walking  redness, blistering, peeling or loosening of the skin, including inside the mouth  signs of infection - fever or chills, cough, sore throat, pain or difficulty passing urine  signs and symptoms of kidney injury like trouble passing urine or change in the amount of urine  signs and symptoms of liver injury like dark yellow or brown urine; general ill feeling or flu-like  symptoms; light-colored stools; loss of appetite; nausea; right upper belly pain; unusually weak or tired; yellowing of the eyes or skin  signs and symptoms of low blood pressure like dizziness; feeling faint or lightheaded, falls; unusually weak or tired  stomach pain  swelling of the ankles, feet, hands  unusual bleeding or bruising  vomiting Side effects that usually do not require medical attention (report to your doctor or health care professional if they continue or are bothersome):  headache  joint pain  muscle cramps or muscle pain  nausea  tiredness This list may not describe all possible side effects. Call your doctor for medical advice about side effects. You may report side effects to FDA at 1-800-FDA-1088. Where should I keep my medicine? This drug is given in a hospital or clinic and will not be stored at home. NOTE: This sheet is a summary. It may not cover all possible information. If you have questions about this medicine, talk to your doctor, pharmacist, or health care provider.  2020 Elsevier/Gold Standard (2018-10-19 22:01:36) Pt discharged in no apparent distress. Pt left ambulatory without assistance. Pt aware of discharge instructions and verbalized understanding and had no further questions.

## 2020-09-26 NOTE — Telephone Encounter (Signed)
Per Dr. Candise Che ok to proceed with Rituxan/Bendeka infusion for Cycle 4 with ANC 0.8.  Patient has injection appt for Udenyca as well to help with ANC.  Patient is aware.

## 2020-09-26 NOTE — Progress Notes (Signed)
Okay to treat despite labs, per dr Candise Che

## 2020-09-27 ENCOUNTER — Telehealth: Payer: Self-pay | Admitting: *Deleted

## 2020-09-27 ENCOUNTER — Inpatient Hospital Stay: Payer: 59

## 2020-09-27 ENCOUNTER — Other Ambulatory Visit: Payer: Self-pay

## 2020-09-27 VITALS — BP 155/90 | HR 81 | Temp 98.0°F | Resp 18

## 2020-09-27 DIAGNOSIS — R160 Hepatomegaly, not elsewhere classified: Secondary | ICD-10-CM | POA: Diagnosis not present

## 2020-09-27 DIAGNOSIS — R7402 Elevation of levels of lactic acid dehydrogenase (LDH): Secondary | ICD-10-CM | POA: Diagnosis not present

## 2020-09-27 DIAGNOSIS — Z5111 Encounter for antineoplastic chemotherapy: Secondary | ICD-10-CM | POA: Diagnosis not present

## 2020-09-27 DIAGNOSIS — Z7189 Other specified counseling: Secondary | ICD-10-CM

## 2020-09-27 DIAGNOSIS — D696 Thrombocytopenia, unspecified: Secondary | ICD-10-CM | POA: Diagnosis not present

## 2020-09-27 DIAGNOSIS — C8314 Mantle cell lymphoma, lymph nodes of axilla and upper limb: Secondary | ICD-10-CM | POA: Diagnosis not present

## 2020-09-27 DIAGNOSIS — Z79899 Other long term (current) drug therapy: Secondary | ICD-10-CM | POA: Diagnosis not present

## 2020-09-27 DIAGNOSIS — Z23 Encounter for immunization: Secondary | ICD-10-CM | POA: Diagnosis not present

## 2020-09-27 DIAGNOSIS — Z5189 Encounter for other specified aftercare: Secondary | ICD-10-CM | POA: Diagnosis not present

## 2020-09-27 DIAGNOSIS — C8318 Mantle cell lymphoma, lymph nodes of multiple sites: Secondary | ICD-10-CM

## 2020-09-27 DIAGNOSIS — Z5112 Encounter for antineoplastic immunotherapy: Secondary | ICD-10-CM | POA: Diagnosis not present

## 2020-09-27 MED ORDER — SODIUM CHLORIDE 0.9 % IV SOLN
10.0000 mg | Freq: Once | INTRAVENOUS | Status: AC
  Administered 2020-09-27: 10 mg via INTRAVENOUS
  Filled 2020-09-27: qty 10

## 2020-09-27 MED ORDER — SODIUM CHLORIDE 0.9 % IV SOLN
90.0000 mg/m2 | Freq: Once | INTRAVENOUS | Status: AC
  Administered 2020-09-27: 175 mg via INTRAVENOUS
  Filled 2020-09-27: qty 7

## 2020-09-27 MED ORDER — SODIUM CHLORIDE 0.9 % IV SOLN
Freq: Once | INTRAVENOUS | Status: AC
  Filled 2020-09-27: qty 250

## 2020-09-27 NOTE — Patient Instructions (Signed)
Bendamustine Injection What is this medicine? BENDAMUSTINE (BEN da MUS teen) is a chemotherapy drug. It is used to treat chronic lymphocytic leukemia and non-Hodgkin lymphoma. This medicine may be used for other purposes; ask your health care provider or pharmacist if you have questions. COMMON BRAND NAME(S): BELRAPZO, BENDEKA, Treanda What should I tell my health care provider before I take this medicine? They need to know if you have any of these conditions:  infection (especially a virus infection such as chickenpox, cold sores, or herpes)  kidney disease  liver disease  an unusual or allergic reaction to bendamustine, mannitol, other medicines, foods, dyes, or preservatives  pregnant or trying to get pregnant  breast-feeding How should I use this medicine? This medicine is for infusion into a vein. It is given by a health care professional in a hospital or clinic setting. Talk to your pediatrician regarding the use of this medicine in children. Special care may be needed. Overdosage: If you think you have taken too much of this medicine contact a poison control center or emergency room at once. NOTE: This medicine is only for you. Do not share this medicine with others. What if I miss a dose? It is important not to miss your dose. Call your doctor or health care professional if you are unable to keep an appointment. What may interact with this medicine? Do not take this medicine with any of the following medications:  clozapine This medicine may also interact with the following medications:  atazanavir  cimetidine  ciprofloxacin  enoxacin  fluvoxamine  medicines for seizures like carbamazepine and phenobarbital  mexiletine  rifampin  tacrine  thiabendazole  zileuton This list may not describe all possible interactions. Give your health care provider a list of all the medicines, herbs, non-prescription drugs, or dietary supplements you use. Also tell them if  you smoke, drink alcohol, or use illegal drugs. Some items may interact with your medicine. What should I watch for while using this medicine? This drug may make you feel generally unwell. This is not uncommon, as chemotherapy can affect healthy cells as well as cancer cells. Report any side effects. Continue your course of treatment even though you feel ill unless your doctor tells you to stop. You may need blood work done while you are taking this medicine. Call your doctor or healthcare provider for advice if you get a fever, chills or sore throat, or other symptoms of a cold or flu. Do not treat yourself. This drug decreases your body's ability to fight infections. Try to avoid being around people who are sick. This medicine may cause serious skin reactions. They can happen weeks to months after starting the medicine. Contact your healthcare provider right away if you notice fevers or flu-like symptoms with a rash. The rash may be red or purple and then turn into blisters or peeling of the skin. Or, you might notice a red rash with swelling of the face, lips or lymph nodes in your neck or under your arms. This medicine may increase your risk to bruise or bleed. Call your doctor or healthcare provider if you notice any unusual bleeding. Talk to your doctor about your risk of cancer. You may be more at risk for certain types of cancers if you take this medicine. Do not become pregnant while taking this medicine or for at least 6 months after stopping it. Women should inform their doctor if they wish to become pregnant or think they might be pregnant. Men should not   father a child while taking this medicine and for at least 3 months after stopping it. There is a potential for serious side effects to an unborn child. Talk to your healthcare provider or pharmacist for more information. Do not breast-feed an infant while taking this medicine or for at least 1 week after stopping it. This medicine may make it  more difficult to father a child. You should talk with your doctor or healthcare provider if you are concerned about your fertility. What side effects may I notice from receiving this medicine? Side effects that you should report to your doctor or health care professional as soon as possible:  allergic reactions like skin rash, itching or hives, swelling of the face, lips, or tongue  low blood counts - this medicine may decrease the number of white blood cells, red blood cells and platelets. You may be at increased risk for infections and bleeding.  rash, fever, and swollen lymph nodes  redness, blistering, peeling, or loosening of the skin, including inside the mouth  signs of infection like fever or chills, cough, sore throat, pain or difficulty passing urine  signs of decreased platelets or bleeding like bruising, pinpoint red spots on the skin, black, tarry stools, blood in the urine  signs of decreased red blood cells like being unusually weak or tired, fainting spells, lightheadedness  signs and symptoms of kidney injury like trouble passing urine or change in the amount of urine  signs and symptoms of liver injury like dark yellow or brown urine; general ill feeling or flu-like symptoms; light-colored stools; loss of appetite; nausea; right upper belly pain; unusually weak or tired; yellowing of the eyes or skin Side effects that usually do not require medical attention (report to your doctor or health care professional if they continue or are bothersome):  constipation  decreased appetite  diarrhea  headache  mouth sores  nausea, vomiting  tiredness This list may not describe all possible side effects. Call your doctor for medical advice about side effects. You may report side effects to FDA at 1-800-FDA-1088. Where should I keep my medicine? This drug is given in a hospital or clinic and will not be stored at home. NOTE: This sheet is a summary. It may not cover all  possible information. If you have questions about this medicine, talk to your doctor, pharmacist, or health care provider.  2020 Elsevier/Gold Standard (2018-11-29 10:26:46)  

## 2020-09-27 NOTE — Telephone Encounter (Signed)
Patient sent following email: "I am scheduled to get a shot on Monday 09/27/20 to help raise my white blood count.  Dr Irene Limbo mentioned to take Claritin, when should I start taking Claritin and for how long? Could I possibly have the Lab and Dr Irene Limbo appointment moved to the latest I can in the on 10/23/20?  It just works so much better for me since I am working." Per Dr. Irene Limbo - sent this email in response: Schedule message sent to move your appts to latest possible on Feb 2. The port flush appt was cancelled.  Dr. Irene Limbo said to take Claritin for 10 days starting days before each growth stimulating injection. Even though you don't have 10 days this time,  you can start taking it today and get 4 doses in before you infection.

## 2020-09-30 ENCOUNTER — Inpatient Hospital Stay: Payer: 59

## 2020-09-30 ENCOUNTER — Other Ambulatory Visit: Payer: Self-pay

## 2020-09-30 VITALS — BP 139/80 | HR 81 | Resp 16

## 2020-09-30 DIAGNOSIS — Z79899 Other long term (current) drug therapy: Secondary | ICD-10-CM | POA: Diagnosis not present

## 2020-09-30 DIAGNOSIS — Z5111 Encounter for antineoplastic chemotherapy: Secondary | ICD-10-CM | POA: Diagnosis not present

## 2020-09-30 DIAGNOSIS — R7402 Elevation of levels of lactic acid dehydrogenase (LDH): Secondary | ICD-10-CM | POA: Diagnosis not present

## 2020-09-30 DIAGNOSIS — Z5112 Encounter for antineoplastic immunotherapy: Secondary | ICD-10-CM | POA: Diagnosis not present

## 2020-09-30 DIAGNOSIS — R160 Hepatomegaly, not elsewhere classified: Secondary | ICD-10-CM | POA: Diagnosis not present

## 2020-09-30 DIAGNOSIS — Z23 Encounter for immunization: Secondary | ICD-10-CM | POA: Diagnosis not present

## 2020-09-30 DIAGNOSIS — C8314 Mantle cell lymphoma, lymph nodes of axilla and upper limb: Secondary | ICD-10-CM | POA: Diagnosis not present

## 2020-09-30 DIAGNOSIS — Z5189 Encounter for other specified aftercare: Secondary | ICD-10-CM | POA: Diagnosis not present

## 2020-09-30 DIAGNOSIS — D696 Thrombocytopenia, unspecified: Secondary | ICD-10-CM | POA: Diagnosis not present

## 2020-09-30 DIAGNOSIS — Z7189 Other specified counseling: Secondary | ICD-10-CM

## 2020-09-30 DIAGNOSIS — C8318 Mantle cell lymphoma, lymph nodes of multiple sites: Secondary | ICD-10-CM

## 2020-09-30 MED ORDER — PEGFILGRASTIM-CBQV 6 MG/0.6ML ~~LOC~~ SOSY
6.0000 mg | PREFILLED_SYRINGE | Freq: Once | SUBCUTANEOUS | Status: AC
  Administered 2020-09-30: 6 mg via SUBCUTANEOUS

## 2020-09-30 NOTE — Patient Instructions (Signed)
Pegfilgrastim injection What is this medicine? PEGFILGRASTIM (PEG fil gra stim) is a long-acting granulocyte colony-stimulating factor that stimulates the growth of neutrophils, a type of white blood cell important in the body's fight against infection. It is used to reduce the incidence of fever and infection in patients with certain types of cancer who are receiving chemotherapy that affects the bone marrow, and to increase survival after being exposed to high doses of radiation. This medicine may be used for other purposes; ask your health care provider or pharmacist if you have questions. COMMON BRAND NAME(S): Fulphila, Neulasta, Nyvepria, UDENYCA, Ziextenzo What should I tell my health care provider before I take this medicine? They need to know if you have any of these conditions:  kidney disease  latex allergy  ongoing radiation therapy  sickle cell disease  skin reactions to acrylic adhesives (On-Body Injector only)  an unusual or allergic reaction to pegfilgrastim, filgrastim, other medicines, foods, dyes, or preservatives  pregnant or trying to get pregnant  breast-feeding How should I use this medicine? This medicine is for injection under the skin. If you get this medicine at home, you will be taught how to prepare and give the pre-filled syringe or how to use the On-body Injector. Refer to the patient Instructions for Use for detailed instructions. Use exactly as directed. Tell your healthcare provider immediately if you suspect that the On-body Injector may not have performed as intended or if you suspect the use of the On-body Injector resulted in a missed or partial dose. It is important that you put your used needles and syringes in a special sharps container. Do not put them in a trash can. If you do not have a sharps container, call your pharmacist or healthcare provider to get one. Talk to your pediatrician regarding the use of this medicine in children. While this drug  may be prescribed for selected conditions, precautions do apply. Overdosage: If you think you have taken too much of this medicine contact a poison control center or emergency room at once. NOTE: This medicine is only for you. Do not share this medicine with others. What if I miss a dose? It is important not to miss your dose. Call your doctor or health care professional if you miss your dose. If you miss a dose due to an On-body Injector failure or leakage, a new dose should be administered as soon as possible using a single prefilled syringe for manual use. What may interact with this medicine? Interactions have not been studied. This list may not describe all possible interactions. Give your health care provider a list of all the medicines, herbs, non-prescription drugs, or dietary supplements you use. Also tell them if you smoke, drink alcohol, or use illegal drugs. Some items may interact with your medicine. What should I watch for while using this medicine? Your condition will be monitored carefully while you are receiving this medicine. You may need blood work done while you are taking this medicine. Talk to your health care provider about your risk of cancer. You may be more at risk for certain types of cancer if you take this medicine. If you are going to need a MRI, CT scan, or other procedure, tell your doctor that you are using this medicine (On-Body Injector only). What side effects may I notice from receiving this medicine? Side effects that you should report to your doctor or health care professional as soon as possible:  allergic reactions (skin rash, itching or hives, swelling of   the face, lips, or tongue)  back pain  dizziness  fever  pain, redness, or irritation at site where injected  pinpoint red spots on the skin  red or dark-brown urine  shortness of breath or breathing problems  stomach or side pain, or pain at the shoulder  swelling  tiredness  trouble  passing urine or change in the amount of urine  unusual bruising or bleeding Side effects that usually do not require medical attention (report to your doctor or health care professional if they continue or are bothersome):  bone pain  muscle pain This list may not describe all possible side effects. Call your doctor for medical advice about side effects. You may report side effects to FDA at 1-800-FDA-1088. Where should I keep my medicine? Keep out of the reach of children. If you are using this medicine at home, you will be instructed on how to store it. Throw away any unused medicine after the expiration date on the label. NOTE: This sheet is a summary. It may not cover all possible information. If you have questions about this medicine, talk to your doctor, pharmacist, or health care provider.  2021 Elsevier/Gold Standard (2019-09-29 13:20:51)  

## 2020-10-01 NOTE — Progress Notes (Signed)
..  Patient assistance for Udenyca from [place] was denied. Primary Insurance denied Brian Kaiser, prefers different brand due to No appeal was submitted for this medication, resulting in coverage of another medication on Insurance plan.

## 2020-10-04 ENCOUNTER — Other Ambulatory Visit: Payer: Self-pay | Admitting: *Deleted

## 2020-10-04 ENCOUNTER — Inpatient Hospital Stay: Payer: 59

## 2020-10-04 ENCOUNTER — Other Ambulatory Visit: Payer: Self-pay

## 2020-10-04 VITALS — BP 140/86 | HR 80 | Temp 98.0°F | Resp 19

## 2020-10-04 DIAGNOSIS — Z5112 Encounter for antineoplastic immunotherapy: Secondary | ICD-10-CM | POA: Diagnosis not present

## 2020-10-04 DIAGNOSIS — Z79899 Other long term (current) drug therapy: Secondary | ICD-10-CM | POA: Diagnosis not present

## 2020-10-04 DIAGNOSIS — Z23 Encounter for immunization: Secondary | ICD-10-CM | POA: Diagnosis not present

## 2020-10-04 DIAGNOSIS — R160 Hepatomegaly, not elsewhere classified: Secondary | ICD-10-CM | POA: Diagnosis not present

## 2020-10-04 DIAGNOSIS — Z5189 Encounter for other specified aftercare: Secondary | ICD-10-CM | POA: Diagnosis not present

## 2020-10-04 DIAGNOSIS — C8314 Mantle cell lymphoma, lymph nodes of axilla and upper limb: Secondary | ICD-10-CM | POA: Diagnosis not present

## 2020-10-04 DIAGNOSIS — Z5111 Encounter for antineoplastic chemotherapy: Secondary | ICD-10-CM | POA: Diagnosis not present

## 2020-10-04 DIAGNOSIS — D696 Thrombocytopenia, unspecified: Secondary | ICD-10-CM | POA: Diagnosis not present

## 2020-10-04 DIAGNOSIS — R7402 Elevation of levels of lactic acid dehydrogenase (LDH): Secondary | ICD-10-CM | POA: Diagnosis not present

## 2020-10-04 MED ORDER — INFLUENZA VAC SPLIT QUAD 0.5 ML IM SUSY
0.5000 mL | PREFILLED_SYRINGE | Freq: Once | INTRAMUSCULAR | Status: AC
  Administered 2020-10-04: 0.5 mL via INTRAMUSCULAR

## 2020-10-04 MED ORDER — INFLUENZA VAC SPLIT QUAD 0.5 ML IM SUSY: 0.5000 mL | PREFILLED_SYRINGE | Freq: Once | INTRAMUSCULAR | Status: DC

## 2020-10-04 MED ORDER — INFLUENZA VAC SPLIT QUAD 0.5 ML IM SUSY
PREFILLED_SYRINGE | INTRAMUSCULAR | Status: AC
  Filled 2020-10-04: qty 0.5

## 2020-10-04 NOTE — Patient Instructions (Signed)
H1N1 Influenza H1N1 influenza is a respiratory illness caused by the H1N1 virus. A respiratory illness affects parts of the body that are involved in breathing, including the nose, throat, and lungs. H1N1 flu is a variety of influenza A. It was first called swine flu because it was like a flu virus that commonly affects pigs. H1N1 flu symptoms may be slightly more severe than symptoms that are caused by other types of seasonal flu. The first outbreak of H1N1 flu in people occurred in 2009. After the virus spread from pigs to people, it eventually changed into the type of virus that can pass from person to person, like other flu viruses. You cannot get the virus by eating pork or pork products. What are the causes? H1N1 flu is caused by the H1N1 virus. You can catch the virus by:  Breathing in droplets from an infected person's cough or sneeze.  Touching something that has been exposed to the virus and then touching your mouth, nose, or eyes. What increases the risk? You may be at higher risk for H1N1 flu if you:  Are age 64 or older.  Are younger than age 3.  Have a weak disease-fighting system (immune system).  Are pregnant.  Have a long-term (chronic) disease.  Live in a nursing home or hospital.  Work in healthcare. What are the signs or symptoms? Signs and symptoms may start 3-5 days after infection. They may include:  Fever.  Sore throat.  Cough.  Muscle aches.  Chills.  Tiredness (fatigue).  Loss of appetite.  Runny or stuffy nose.  Headache.  Nausea.  Vomiting or diarrhea. These symptoms are less common. How is this diagnosed? This condition may be diagnosed based on your symptoms, your medical history, and a physical exam. In some cases, a swab of fluid from your nose or throat may be tested for the H1N1 virus. How is this treated? H1N1 flu usually goes away in 4-8 days without treatment. Taking care of yourself at home may be all that you need to do during  this time. Your health care provider may recommend taking over-the-counter medicines, drinking plenty of fluids, and adding humidity to the air in your home. If you are at risk for severe flu complications, you may be treated with antiviral medicine. If this medicine is taken soon enough, it can shorten the illness and make it less severe. Follow these instructions at home: Medicines  Take over-the-counter and prescription medicines only as told by your health care provider.  If you were prescribed antiviral medicine, take it as told by your health care provider. Do not stop taking it even if you start to feel better.  Do not give aspirin to a child with the flu, because of the association with Reye syndrome. Activity  Rest at home while you recover.  Prevent spreading H1N1 flu to others. Take the following steps until your fever has been gone for 24 hours without the use of medicine: ? Avoid leaving home. Stay home from work or school. ? Stay away from pregnant women, the elderly, young children, and people with chronic medical conditions. General instructions  Use a cool-mist humidifier to add humidity to the air in your home. This can make breathing easier.  Cover your mouth and nose when you cough or sneeze.  Do not prepare food for others while you are infected.  Wash your hands often with soap and water for at least 20 seconds, especially after you cough or sneeze. If soap and  water are not available, use hand sanitizer.  Make sure that all people in your household wash their hands well and often.  Drink enough fluid to keep your urine pale yellow.  Keep all follow-up visits. This is important.      How is this prevented?  Getting a yearly (annual) flu shot is the best way to prevent the flu. You may get the flu shot in late summer, fall, or winter. Ask your health care provider when you should get your flu shot.  Wash your hands with soap and water often, especially after  you cough or sneeze. If soap and water are not available, use hand sanitizer.  Avoid contact with people who are sick during cold and flu season. Wear a mask to protect yourself when you are around people who are sick or might be sick.  Eat a healthy diet, drink plenty of fluids, get enough sleep, and exercise regularly.  Do not touch your face if you have not cleaned your hands. Contact a health care provider if:  You develop new symptoms.  You have severe symptoms that do not get better with home care.  You have flu symptoms: ? That last longer than one week. ? That come back.  You lack energy (are lethargic). Get help right away if:  You have trouble breathing.  You have chest pain.  You cough up blood or yellow or gray fluid.  You feel dizzy or you faint.  You feel confused. These symptoms may represent a serious problem that is an emergency. Do not wait to see if the symptoms will go away. Get medical help right away. Call your local emergency services (911 in the [place] Do not drive yourself to the hospital. Summary  H1N1 influenza is a respiratory illness caused by the H1N1 virus. Symptoms may be slightly more severe than symptoms that are caused by other types of seasonal flu.  H1N1 flu usually goes away in 4-8 days without treatment. Taking care of yourself at home may be all that you need to do during this time.  Stay home from work or school until your fever has been gone for 24 hours without the use of medicine.  Getting a yearly (annual) flu shot and practicing good hand hygiene may help to prevent the flu. This information is not intended to replace advice given to you by your health care provider. Make sure you discuss any questions you have with your health care provider. Document Revised: 03/29/2020 Document Reviewed: 03/29/2020 Elsevier Patient Education  [employer].

## 2020-10-22 NOTE — Progress Notes (Signed)
HEMATOLOGY/ONCOLOGY CONSULTATION NOTE  Date of Service: 10/23/2020  Patient Care Team: [name], MD as PCP - General (Family Medicine)  CHIEF COMPLAINTS/PURPOSE OF CONSULTATION:  Continue mx of mantle cell lymphoma  HISTORY OF PRESENTING ILLNESS:   Brian Kaiser is a wonderful 65 y.o. male who has been referred to [place] by Dr. [name] for evaluation and management of concern for lymphoma. Pt is accompanied today by his wife, [name]. The pt reports that he is doing well overall.    The pt reports that last November he had an Upper Endoscopy because he was having abdominal bloating. Pt was found to have gastritis and was given Protonix and Pepcid to use as needed. Pt has not used Protonix and Pepcid in two months. His abdominal bloating has been intermittent since November, but has been better recently.   Last July pt had surgery on his right forearm and was experiencing difficulty sleeping due to him continuously rolling on the arm. This lasted for some time. Pt had his second COVID19 vaccine in February and his second Shingles vaccine two weeks later. After this he began experiencing fatigue, which is still present. His fatigue is not progressive and he is able to complete his daily task. Pt has drenching night sweats that started two months ago. These do not occur every night. He has lost 15 lbs in the last few months without effort, but started back going to the gym in May. Pt has noticed that left inguinal lymph node has fluctuated in size since he was diagnosed with Hepatitis B nearly 20 years ago. Recently the lymph node has grown in size and become more firm. He has also noticed an increase in the size of a right cervical lymph node. Pt has had left testicular discomfort in the last few weeks. He reports b/l ankle swelling, calf cramping and upper left abdominal pain with certain sleeping positions.  His Hepatitis B was picked up after he was experiencing abdominal discomfort and  was treated with milk thistle. It has been monitored via labwork over the years. Pt had a Basal Cell lesion on his left thigh and back a few months ago. They have been removed.   Of note prior to the patient's visit today, pt has had CT C/A/P ([id number]) completed on 06/06/2020 with results revealing "Extensive adenopathy in the chest, abdomen and pelvis. Marked splenomegaly. Findings most concerning for lymphoma. Small amount of free fluid in the abdomen and pelvis."   Most recent lab results (06/03/2020) of CBC w/diff & CMP is as follows: all values are WNL except for PLT at 135K. 06/03/2020 TSH at 7.86 06/03/2020 PSA at 0.20 06/03/2020 Sed Rate at 1  On review of systems, pt reports night sweats, new lumps/bumps, ankle swelling, calf cramping, constipation, change in color of bowel movements, left testicular pain, fatigue, unexpected weight loss, improving abdominal bloating, early satiety and denies SOB, chest pain, fevers, chills, bloody/black stools, headaches, vision changes, bone pain, back pain, flank pain and any other symptoms.   On PMHx the pt reports GERD, Forearm laceration, Right Forearm Ligament Repair, Hepatitis B, Hyperlipidemia. On Family Hx the pt reports that his brother was diagnosed with DLBCL a year ago.  INTERVAL HISTORY:  Brian Kaiser is a wonderful 65 y.o. male who is here for evaluation and management of his Mantle Cell Lymphoma. He is here prior to C5 Bendamustine/Rituxan. The patient's last visit with [place] was on 09/25/2020.He is accompanied here today by his wife. The pt reports that  he is doing well overall.  The pt reports no new concerns or symptoms. He notes he has some bumps following getting the [place] shots (approx. 1-2 days after) that were very small and went away within a week by themselves. He denies any pain due to this and notes minimal itching.  Lab results today 10/23/2020 of CBC w/diff and CMP is as follows: all values are WNL.  On review of  systems, pt denies infection issues, abdominal pain or distention, SOB, acute leg swelling, fevers, chills, night sweats, n/v/d, mouth sores, unexpected weight loss, new lumps/bumps, skin rashes, back pain and any other symptoms.  MEDICAL HISTORY:  Past Medical History:  Diagnosis Date  . Allergy   . Forearm laceration    right forearm  . GERD (gastroesophageal reflux disease)   . Hepatitis B   . Hyperlipidemia   . Pulmonary lesion    Left pulmonary schwannoma  . Schwannoma    left lower lung    SURGICAL HISTORY: Past Surgical History:  Procedure Laterality Date  . COLONOSCOPY    . LIGAMENT REPAIR Right 04/19/2019   Right forearm  . LIPOMA EXCISION Left    neck  . LUNG SURGERY Left   . OTHER SURGICAL HISTORY Left    pulmonary schwannoma excision  . UPPER GASTROINTESTINAL ENDOSCOPY    . WOUND EXPLORATION Right 04/19/2019   Procedure: WOUND EXPLORATION; REPAIR RIGHT FOREARM LACERATION;  Surgeon: Charlotte Crumb, MD;  Location: [facility];  Service: Orthopedics;  Laterality: Right;  AXILLARY BLOCK AND MAC    SOCIAL HISTORY: Social History   Socioeconomic History  . Marital status: Married    Spouse name: Not on file  . Number of children: Not on file  . Years of education: Not on file  . Highest education level: Not on file  Occupational History  . Not on file  Tobacco Use  . Smoking status: Never Smoker  . Smokeless tobacco: Never Used  Vaping Use  . Vaping Use: Never used  Substance and Sexual Activity  . Alcohol use: No    Alcohol/week: 0.0 standard drinks  . Drug use: No  . Sexual activity: Not on file  Other Topics Concern  . Not on file  Social History Narrative  . Not on file   Social Determinants of Health   Financial Resource Strain: Not on file  Food Insecurity: Not on file  Transportation Needs: Not on file  Physical Activity: Not on file  Stress: Not on file  Social Connections: Not on file  Intimate Partner Violence: Not on  file    FAMILY HISTORY: Family History  Problem Relation Age of Onset  . Macular degeneration Mother   . Dementia Mother   . Diverticulitis Mother   . Prostate cancer Paternal Grandfather   . Diverticulitis Brother   . Colon cancer Neg Hx   . Colon polyps Neg Hx   . Esophageal cancer Neg Hx   . Liver cancer Neg Hx   . Pancreatic cancer Neg Hx   . Rectal cancer Neg Hx   . Stomach cancer Neg Hx     ALLERGIES:  is allergic to augmentin [amoxicillin-pot clavulanate].  MEDICATIONS:  Current Outpatient Medications  Medication Sig Dispense Refill  . acetaminophen (TYLENOL) 325 MG tablet Take 325 mg by mouth every 6 (six) hours as needed for moderate pain or headache.    . allopurinol (ZYLOPRIM) 100 MG tablet Take 1 tablet (100 mg total) by mouth 2 (two) times daily. 60 tablet 0  .  cholecalciferol (VITAMIN D3) 25 MCG (1000 UNIT) tablet Take 1,000 Units by mouth daily.    Marland Kitchen dexamethasone (DECADRON) 4 MG tablet Take 2 tablets (8 mg total) by mouth daily. Start the day after bendamustine chemotherapy for 2 days. Take with food. 30 tablet 1  . famotidine (PEPCID) 20 MG tablet Take 1 tablet (20 mg total) by mouth at bedtime. (Patient taking differently: Take 20 mg by mouth daily as needed for heartburn. ) 30 tablet 3  . LORazepam (ATIVAN) 0.5 MG tablet Take 1 tablet (0.5 mg total) by mouth every 8 (eight) hours as needed for anxiety. 30 tablet 0  . Multiple Vitamin (MULTIVITAMIN) tablet Take 1 tablet by mouth daily.    . Omega-3 Fatty Acids (FISH OIL) 1000 MG CAPS Take 1,000 mg by mouth daily.    . ondansetron (ZOFRAN) 8 MG tablet Take 1 tablet (8 mg total) by mouth 2 (two) times daily as needed for refractory nausea / vomiting. Start on day 2 after bendamustine chemo. 30 tablet 1  . pantoprazole (PROTONIX) 40 MG tablet Take 1 tablet (40 mg total) by mouth daily. 90 tablet 3  . prochlorperazine (COMPAZINE) 10 MG tablet Take 1 tablet (10 mg total) by mouth every 6 (six) hours as needed (Nausea  or vomiting). 30 tablet 1  . vitamin C (ASCORBIC ACID) 250 MG tablet Take 250 mg by mouth daily.     No current facility-administered medications for this visit.    REVIEW OF SYSTEMS:   10 Point review of Systems was done is negative except as noted above.  PHYSICAL EXAMINATION: ECOG PERFORMANCE STATUS: 1 - Symptomatic but completely ambulatory  . Vitals:   10/23/20 1456  BP: 135/89  Pulse: 91  Resp: 18  Temp: (!) 97.2 F (36.2 C)  SpO2: 96%   Filed Weights   10/23/20 1456  Weight: 186 lb 4.8 oz (84.5 kg)   .Body mass index is 27.51 kg/m.    GENERAL:alert, in no acute distress and comfortable SKIN: no acute rashes, no significant lesions EYES: conjunctiva are pink and non-injected, sclera anicteric OROPHARYNX: MMM, no exudates, no oropharyngeal erythema or ulceration NECK: supple, no JVD LYMPH:  no palpable lymphadenopathy in the cervical, axillary or inguinal regions LUNGS: clear to auscultation b/l with normal respiratory effort HEART: regular rate & rhythm ABDOMEN:  normoactive bowel sounds , non tender, not distended. Extremity: no pedal edema PSYCH: alert & oriented x 3 with fluent speech NEURO: no focal motor/sensory deficits  LABORATORY DATA:  I have reviewed the data as listed  . CBC Latest Ref Rng & Units 10/23/2020 09/25/2020 08/28/2020  WBC 4.0 - 10.5 K/uL 5.5 2.4(L) 5.0  Hemoglobin 13.0 - 17.0 g/dL 14.1 13.7 13.4  Hematocrit 39.0 - 52.0 % 40.4 39.2 38.7(L)  Platelets 150 - 400 K/uL 221 184 167   ANC 800 . CMP Latest Ref Rng & Units 10/23/2020 09/25/2020 08/28/2020  Glucose 70 - 99 mg/dL 85 77 94  BUN 8 - 23 mg/dL _0 Creatinine 0.61 - 1.24 mg/dL 1.10 1.12 1.10  Sodium 135 - 145 mmol/L 143 143 142  Potassium 3.5 - 5.1 mmol/L 4.0 4.1 4.2  Chloride 98 - 111 mmol/L 107 108 107  CO2 22 - 32 mmol/L _1 Calcium 8.9 - 10.3 mg/dL 9.2 9.2 9.3  Total Protein 6.5 - 8.1 g/dL 6.9 6.9 6.5  Total Bilirubin 0.3 - 1.2 mg/dL 0.5 0.5 0.3  Alkaline Phos 38 -  126 U/L 101 62 53  AST  15 - 41 U/L 27 36 25  ALT 0 - 44 U/L _0 06/20/2020 Right Axillary Surgical Pathology Report [id number]):    06/20/2020 Right Axillary Lymph Node Flow Pathology ([id number]):   06/11/2020 Flow Pathology ([id number]):   RADIOGRAPHIC STUDIES: I have personally reviewed the radiological images as listed and agreed with the findings in the report. No results found.  ASSESSMENT & PLAN:   65 yo with   1) Recently diagnosed stage IV mantle cell lymphoma with extensive lymphadenopathy and massive splenomegaly . 2) s/p massive splenomegaly likely related to mantle cell lymphoma  3) thrombocytopenia-mild platelets of 132k likely related to lymphoma. 4) hepatitis B core antibody positive, surface antigen negative, HBV DNA PCR negative 5) elevated LDH due to lymphoma  PLAN: -Discussed pt labwork today, 10/23/2020; blood counts an chemistries all normal.  -The pt has no prohibitive toxicities from continuing C5D1 Bendamustine + Rituxan tomorrow. -Advised pt we have one more cycle remaining following C5. -Discussed potential of maintenance Rituxan versus monitoring given remission following treatment. -Discussed potential of transplant as aggressive treatment post-remission. Advised pt this does not extend time significantly more than the Rituxan for three year period. Duration of response is not given to be much more advantageous unless in the 75s of age. -Will get a scan 3 months post-treatment completion to observe remission or not. Will allow that 3 month period for recovery. -Will wait for referral for transplant education or trials ongoing. Will revisit this following C6. The pt is agreeable and aware they can send a MyChart message regarding this at any time. -Continue Claritin to block discomfort from [place] shot. Advise pt he can use Benadryl in addition if itching or rash again. -Will see back day prior C6D1 in 4 weeks.    FOLLOW UP -PLz  schedule C6 of treatment (D1,D2 and D4) as ordered at [place] per patients preference. -MD visit with portflush and labs 1 day prior to treatment  The total time spent in the appointment was 30 minutes and more than 50% was on counseling and direct patient cares.  All of the patient's questions were answered with apparent satisfaction. The patient knows to call the clinic with any problems, questions or concerns.    Sullivan Lone MD [place] AAHIVMS [facility] Hematology/Oncology Physician [facility] [place]  (Office):       [phone] (Work cell):  [phone] (Fax):           [phone]  10/23/2020 3:36 PM  I, Reinaldo Raddle, am acting as scribe for Dr. Sullivan Lone, MD.  .I have reviewed the above documentation for accuracy and completeness, and I agree with the above. Brunetta Genera MD

## 2020-10-23 ENCOUNTER — Other Ambulatory Visit: Payer: 59

## 2020-10-23 ENCOUNTER — Ambulatory Visit: Payer: 59 | Admitting: Hematology

## 2020-10-23 ENCOUNTER — Inpatient Hospital Stay (HOSPITAL_BASED_OUTPATIENT_CLINIC_OR_DEPARTMENT_OTHER): Payer: 59 | Admitting: Hematology

## 2020-10-23 ENCOUNTER — Other Ambulatory Visit: Payer: Self-pay

## 2020-10-23 ENCOUNTER — Inpatient Hospital Stay: Payer: 59 | Attending: Hematology

## 2020-10-23 VITALS — BP 135/89 | HR 91 | Temp 97.2°F | Resp 18 | Ht 69.0 in | Wt 186.3 lb

## 2020-10-23 DIAGNOSIS — D696 Thrombocytopenia, unspecified: Secondary | ICD-10-CM | POA: Diagnosis not present

## 2020-10-23 DIAGNOSIS — C8318 Mantle cell lymphoma, lymph nodes of multiple sites: Secondary | ICD-10-CM

## 2020-10-23 DIAGNOSIS — Z5111 Encounter for antineoplastic chemotherapy: Secondary | ICD-10-CM | POA: Diagnosis not present

## 2020-10-23 DIAGNOSIS — R7402 Elevation of levels of lactic acid dehydrogenase (LDH): Secondary | ICD-10-CM | POA: Diagnosis not present

## 2020-10-23 DIAGNOSIS — Z79899 Other long term (current) drug therapy: Secondary | ICD-10-CM | POA: Diagnosis not present

## 2020-10-23 DIAGNOSIS — C8314 Mantle cell lymphoma, lymph nodes of axilla and upper limb: Secondary | ICD-10-CM | POA: Insufficient documentation

## 2020-10-23 DIAGNOSIS — Z5112 Encounter for antineoplastic immunotherapy: Secondary | ICD-10-CM | POA: Diagnosis present

## 2020-10-23 DIAGNOSIS — Z5189 Encounter for other specified aftercare: Secondary | ICD-10-CM | POA: Diagnosis not present

## 2020-10-23 DIAGNOSIS — Z8042 Family history of malignant neoplasm of prostate: Secondary | ICD-10-CM | POA: Diagnosis not present

## 2020-10-23 DIAGNOSIS — Z7189 Other specified counseling: Secondary | ICD-10-CM

## 2020-10-23 DIAGNOSIS — Z807 Family history of other malignant neoplasms of lymphoid, hematopoietic and related tissues: Secondary | ICD-10-CM | POA: Insufficient documentation

## 2020-10-23 LAB — CBC WITH DIFFERENTIAL (CANCER CENTER ONLY)
Abs Immature Granulocytes: 0.03 10*3/uL (ref 0.00–0.07)
Basophils Absolute: 0.1 10*3/uL (ref 0.0–0.1)
Basophils Relative: 2 %
Eosinophils Absolute: 0.3 10*3/uL (ref 0.0–0.5)
Eosinophils Relative: 6 %
HCT: 40.4 % (ref 39.0–52.0)
Hemoglobin: 14.1 g/dL (ref 13.0–17.0)
Immature Granulocytes: 1 %
Lymphocytes Relative: 13 %
Lymphs Abs: 0.7 10*3/uL (ref 0.7–4.0)
MCH: 30.6 pg (ref 26.0–34.0)
MCHC: 34.9 g/dL (ref 30.0–36.0)
MCV: 87.6 fL (ref 80.0–100.0)
Monocytes Absolute: 0.8 10*3/uL (ref 0.1–1.0)
Monocytes Relative: 14 %
Neutro Abs: 3.6 10*3/uL (ref 1.7–7.7)
Neutrophils Relative %: 64 %
Platelet Count: 221 10*3/uL (ref 150–400)
RBC: 4.61 MIL/uL (ref 4.22–5.81)
RDW: 14.6 % (ref 11.5–15.5)
WBC Count: 5.5 10*3/uL (ref 4.0–10.5)
nRBC: 0 % (ref 0.0–0.2)

## 2020-10-23 LAB — CMP (CANCER CENTER ONLY)
ALT: 26 U/L (ref 0–44)
AST: 27 U/L (ref 15–41)
Albumin: 3.8 g/dL (ref 3.5–5.0)
Alkaline Phosphatase: 101 U/L (ref 38–126)
Anion gap: 7 (ref 5–15)
BUN: 14 mg/dL (ref 8–23)
CO2: 29 mmol/L (ref 22–32)
Calcium: 9.2 mg/dL (ref 8.9–10.3)
Chloride: 107 mmol/L (ref 98–111)
Creatinine: 1.1 mg/dL (ref 0.61–1.24)
GFR, Estimated: >60 mL/min (ref >60)
Glucose, Bld: 85 mg/dL (ref 70–99)
Potassium: 4 mmol/L (ref 3.5–5.1)
Sodium: 143 mmol/L (ref 135–145)
Total Bilirubin: 0.5 mg/dL (ref 0.3–1.2)
Total Protein: 6.9 g/dL (ref 6.5–8.1)

## 2020-10-24 ENCOUNTER — Inpatient Hospital Stay: Payer: 59

## 2020-10-24 VITALS — BP 117/83 | HR 57 | Temp 98.9°F | Resp 17

## 2020-10-24 DIAGNOSIS — Z8042 Family history of malignant neoplasm of prostate: Secondary | ICD-10-CM | POA: Diagnosis not present

## 2020-10-24 DIAGNOSIS — Z807 Family history of other malignant neoplasms of lymphoid, hematopoietic and related tissues: Secondary | ICD-10-CM | POA: Diagnosis not present

## 2020-10-24 DIAGNOSIS — Z5111 Encounter for antineoplastic chemotherapy: Secondary | ICD-10-CM | POA: Diagnosis not present

## 2020-10-24 DIAGNOSIS — R7402 Elevation of levels of lactic acid dehydrogenase (LDH): Secondary | ICD-10-CM | POA: Diagnosis not present

## 2020-10-24 DIAGNOSIS — Z7189 Other specified counseling: Secondary | ICD-10-CM

## 2020-10-24 DIAGNOSIS — C8314 Mantle cell lymphoma, lymph nodes of axilla and upper limb: Secondary | ICD-10-CM | POA: Diagnosis not present

## 2020-10-24 DIAGNOSIS — Z79899 Other long term (current) drug therapy: Secondary | ICD-10-CM | POA: Diagnosis not present

## 2020-10-24 DIAGNOSIS — Z5189 Encounter for other specified aftercare: Secondary | ICD-10-CM | POA: Diagnosis not present

## 2020-10-24 DIAGNOSIS — D696 Thrombocytopenia, unspecified: Secondary | ICD-10-CM | POA: Diagnosis not present

## 2020-10-24 DIAGNOSIS — C8318 Mantle cell lymphoma, lymph nodes of multiple sites: Secondary | ICD-10-CM

## 2020-10-24 MED ORDER — DIPHENHYDRAMINE HCL 25 MG PO CAPS
ORAL_CAPSULE | ORAL | Status: AC
  Filled 2020-10-24: qty 2

## 2020-10-24 MED ORDER — PALONOSETRON HCL INJECTION 0.25 MG/5ML
0.2500 mg | Freq: Once | INTRAVENOUS | Status: AC
  Administered 2020-10-24: 0.25 mg via INTRAVENOUS

## 2020-10-24 MED ORDER — FAMOTIDINE IN NACL 20-0.9 MG/50ML-% IV SOLN
20.0000 mg | Freq: Once | INTRAVENOUS | Status: AC
  Administered 2020-10-24: 20 mg via INTRAVENOUS

## 2020-10-24 MED ORDER — SODIUM CHLORIDE 0.9 % IV SOLN
Freq: Once | INTRAVENOUS | Status: AC
  Filled 2020-10-24: qty 250

## 2020-10-24 MED ORDER — DIPHENHYDRAMINE HCL 25 MG PO CAPS
50.0000 mg | ORAL_CAPSULE | Freq: Once | ORAL | Status: AC
  Administered 2020-10-24: 50 mg via ORAL

## 2020-10-24 MED ORDER — SODIUM CHLORIDE 0.9 % IV SOLN
10.0000 mg | Freq: Once | INTRAVENOUS | Status: AC
  Administered 2020-10-24: 10 mg via INTRAVENOUS
  Filled 2020-10-24: qty 10

## 2020-10-24 MED ORDER — FAMOTIDINE IN NACL 20-0.9 MG/50ML-% IV SOLN
INTRAVENOUS | Status: AC
  Filled 2020-10-24: qty 50

## 2020-10-24 MED ORDER — ACETAMINOPHEN 325 MG PO TABS
ORAL_TABLET | ORAL | Status: AC
  Filled 2020-10-24: qty 2

## 2020-10-24 MED ORDER — SODIUM CHLORIDE 0.9 % IV SOLN
90.0000 mg/m2 | Freq: Once | INTRAVENOUS | Status: AC
  Administered 2020-10-24: 175 mg via INTRAVENOUS
  Filled 2020-10-24: qty 7

## 2020-10-24 MED ORDER — PALONOSETRON HCL INJECTION 0.25 MG/5ML
INTRAVENOUS | Status: AC
  Filled 2020-10-24: qty 5

## 2020-10-24 MED ORDER — ACETAMINOPHEN 325 MG PO TABS
650.0000 mg | ORAL_TABLET | Freq: Once | ORAL | Status: AC
  Administered 2020-10-24: 650 mg via ORAL

## 2020-10-24 MED ORDER — SODIUM CHLORIDE 0.9 % IV SOLN
375.0000 mg/m2 | Freq: Once | INTRAVENOUS | Status: AC
  Administered 2020-10-24: 700 mg via INTRAVENOUS
  Filled 2020-10-24: qty 50

## 2020-10-25 ENCOUNTER — Other Ambulatory Visit: Payer: Self-pay

## 2020-10-25 ENCOUNTER — Inpatient Hospital Stay: Payer: 59

## 2020-10-25 VITALS — BP 145/85 | HR 66 | Temp 98.7°F | Resp 16

## 2020-10-25 DIAGNOSIS — Z79899 Other long term (current) drug therapy: Secondary | ICD-10-CM | POA: Diagnosis not present

## 2020-10-25 DIAGNOSIS — R7402 Elevation of levels of lactic acid dehydrogenase (LDH): Secondary | ICD-10-CM | POA: Diagnosis not present

## 2020-10-25 DIAGNOSIS — Z8042 Family history of malignant neoplasm of prostate: Secondary | ICD-10-CM | POA: Diagnosis not present

## 2020-10-25 DIAGNOSIS — Z7189 Other specified counseling: Secondary | ICD-10-CM

## 2020-10-25 DIAGNOSIS — C8318 Mantle cell lymphoma, lymph nodes of multiple sites: Secondary | ICD-10-CM

## 2020-10-25 DIAGNOSIS — Z5111 Encounter for antineoplastic chemotherapy: Secondary | ICD-10-CM | POA: Diagnosis not present

## 2020-10-25 DIAGNOSIS — C8314 Mantle cell lymphoma, lymph nodes of axilla and upper limb: Secondary | ICD-10-CM | POA: Diagnosis not present

## 2020-10-25 DIAGNOSIS — Z807 Family history of other malignant neoplasms of lymphoid, hematopoietic and related tissues: Secondary | ICD-10-CM | POA: Diagnosis not present

## 2020-10-25 DIAGNOSIS — D696 Thrombocytopenia, unspecified: Secondary | ICD-10-CM | POA: Diagnosis not present

## 2020-10-25 DIAGNOSIS — Z5189 Encounter for other specified aftercare: Secondary | ICD-10-CM | POA: Diagnosis not present

## 2020-10-25 MED ORDER — SODIUM CHLORIDE 0.9 % IV SOLN
90.0000 mg/m2 | Freq: Once | INTRAVENOUS | Status: AC
  Administered 2020-10-25: 175 mg via INTRAVENOUS
  Filled 2020-10-25: qty 7

## 2020-10-25 MED ORDER — SODIUM CHLORIDE 0.9 % IV SOLN
Freq: Once | INTRAVENOUS | Status: AC
  Filled 2020-10-25: qty 250

## 2020-10-25 MED ORDER — SODIUM CHLORIDE 0.9 % IV SOLN
10.0000 mg | Freq: Once | INTRAVENOUS | Status: AC
  Administered 2020-10-25: 10 mg via INTRAVENOUS
  Filled 2020-10-25: qty 10

## 2020-10-25 NOTE — Patient Instructions (Signed)
Bendamustine Injection What is this medicine? BENDAMUSTINE (BEN da MUS teen) is a chemotherapy drug. It is used to treat chronic lymphocytic leukemia and non-Hodgkin lymphoma. This medicine may be used for other purposes; ask your health care provider or pharmacist if you have questions. COMMON BRAND NAME(S): BELRAPZO, BENDEKA, Treanda What should I tell my health care provider before I take this medicine? They need to know if you have any of these conditions:  infection (especially a virus infection such as chickenpox, cold sores, or herpes)  kidney disease  liver disease  an unusual or allergic reaction to bendamustine, mannitol, other medicines, foods, dyes, or preservatives  pregnant or trying to get pregnant  breast-feeding How should I use this medicine? This medicine is for infusion into a vein. It is given by a health care professional in a hospital or clinic setting. Talk to your pediatrician regarding the use of this medicine in children. Special care may be needed. Overdosage: If you think you have taken too much of this medicine contact a poison control center or emergency room at once. NOTE: This medicine is only for you. Do not share this medicine with others. What if I miss a dose? It is important not to miss your dose. Call your doctor or health care professional if you are unable to keep an appointment. What may interact with this medicine? Do not take this medicine with any of the following medications:  clozapine This medicine may also interact with the following medications:  atazanavir  cimetidine  ciprofloxacin  enoxacin  fluvoxamine  medicines for seizures like carbamazepine and phenobarbital  mexiletine  rifampin  tacrine  thiabendazole  zileuton This list may not describe all possible interactions. Give your health care provider a list of all the medicines, herbs, non-prescription drugs, or dietary supplements you use. Also tell them if  you smoke, drink alcohol, or use illegal drugs. Some items may interact with your medicine. What should I watch for while using this medicine? This drug may make you feel generally unwell. This is not uncommon, as chemotherapy can affect healthy cells as well as cancer cells. Report any side effects. Continue your course of treatment even though you feel ill unless your doctor tells you to stop. You may need blood work done while you are taking this medicine. Call your doctor or healthcare provider for advice if you get a fever, chills or sore throat, or other symptoms of a cold or flu. Do not treat yourself. This drug decreases your body's ability to fight infections. Try to avoid being around people who are sick. This medicine may cause serious skin reactions. They can happen weeks to months after starting the medicine. Contact your healthcare provider right away if you notice fevers or flu-like symptoms with a rash. The rash may be red or purple and then turn into blisters or peeling of the skin. Or, you might notice a red rash with swelling of the face, lips or lymph nodes in your neck or under your arms. In some patients, this medicine may cause a serious brain infection that may cause death. If you have any problems seeing, thinking, speaking, walking, or standing, tell your health care provider right away. If you cannot reach your health care provider, urgently seek other source of medical care. This medicine may increase your risk to bruise or bleed. Call your doctor or healthcare provider if you notice any unusual bleeding. Talk to your doctor about your risk of cancer. You may be more at   risk for certain types of cancers if you take this medicine. This medicine may increase your risk of skin cancer. Check your skin for changes to moles or for new growths while taking this medicine. Call your health care provider if you notice any of these skin changes. Do not become pregnant while taking this  medicine or for at least 6 months after stopping it. Women should inform their doctor if they wish to become pregnant or think they might be pregnant. Men should not father a child while taking this medicine and for at least 3 months after stopping it. There is a potential for serious side effects to an unborn child. Talk to your healthcare provider or pharmacist for more information. Do not breast-feed an infant while taking this medicine or for at least 1 week after stopping it. This medicine may make it more difficult to father a child. You should talk with your doctor or healthcare provider if you are concerned about your fertility. What side effects may I notice from receiving this medicine? Side effects that you should report to your doctor or health care professional as soon as possible:  allergic reactions like skin rash, itching or hives, swelling of the face, lips, or tongue  low blood counts - this medicine may decrease the number of white blood cells, red blood cells and platelets. You may be at increased risk for infections and bleeding.  rash, fever, and swollen lymph nodes  redness, blistering, peeling, or loosening of the skin, including inside the mouth  signs of infection like fever or chills, cough, sore throat, pain or difficulty passing urine  signs of decreased platelets or bleeding like bruising, pinpoint red spots on the skin, black, tarry stools, blood in the urine  signs of decreased red blood cells like being unusually weak or tired, fainting spells, lightheadedness  signs and symptoms of kidney injury like trouble passing urine or change in the amount of urine  signs and symptoms of liver injury like dark yellow or brown urine; general ill feeling or flu-like symptoms; light-colored stools; loss of appetite; nausea; right upper belly pain; unusually weak or tired; yellowing of the eyes or skin Side effects that usually do not require medical attention (report to your  doctor or health care professional if they continue or are bothersome):  constipation  decreased appetite  diarrhea  headache  mouth sores  nausea, vomiting  tiredness This list may not describe all possible side effects. Call your doctor for medical advice about side effects. You may report side effects to FDA at 1-800-FDA-1088. Where should I keep my medicine? This drug is given in a hospital or clinic and will not be stored at home. NOTE: This sheet is a summary. It may not cover all possible information. If you have questions about this medicine, talk to your doctor, pharmacist, or health care provider.  2021 Elsevier/Gold Standard (2020-03-04 12:11:43)  

## 2020-10-28 ENCOUNTER — Other Ambulatory Visit: Payer: Self-pay

## 2020-10-28 ENCOUNTER — Inpatient Hospital Stay: Payer: 59

## 2020-10-28 VITALS — BP 158/80 | HR 52 | Temp 98.0°F | Resp 18

## 2020-10-28 DIAGNOSIS — D696 Thrombocytopenia, unspecified: Secondary | ICD-10-CM | POA: Diagnosis not present

## 2020-10-28 DIAGNOSIS — R7402 Elevation of levels of lactic acid dehydrogenase (LDH): Secondary | ICD-10-CM | POA: Diagnosis not present

## 2020-10-28 DIAGNOSIS — Z7189 Other specified counseling: Secondary | ICD-10-CM

## 2020-10-28 DIAGNOSIS — Z79899 Other long term (current) drug therapy: Secondary | ICD-10-CM | POA: Diagnosis not present

## 2020-10-28 DIAGNOSIS — Z8042 Family history of malignant neoplasm of prostate: Secondary | ICD-10-CM | POA: Diagnosis not present

## 2020-10-28 DIAGNOSIS — C8314 Mantle cell lymphoma, lymph nodes of axilla and upper limb: Secondary | ICD-10-CM | POA: Diagnosis not present

## 2020-10-28 DIAGNOSIS — Z5111 Encounter for antineoplastic chemotherapy: Secondary | ICD-10-CM | POA: Diagnosis not present

## 2020-10-28 DIAGNOSIS — Z5189 Encounter for other specified aftercare: Secondary | ICD-10-CM | POA: Diagnosis not present

## 2020-10-28 DIAGNOSIS — C8318 Mantle cell lymphoma, lymph nodes of multiple sites: Secondary | ICD-10-CM

## 2020-10-28 DIAGNOSIS — Z807 Family history of other malignant neoplasms of lymphoid, hematopoietic and related tissues: Secondary | ICD-10-CM | POA: Diagnosis not present

## 2020-10-28 MED ORDER — PEGFILGRASTIM-BMEZ 6 MG/0.6ML ~~LOC~~ SOSY
6.0000 mg | PREFILLED_SYRINGE | Freq: Once | SUBCUTANEOUS | Status: AC
  Administered 2020-10-28: 6 mg via SUBCUTANEOUS

## 2020-10-28 NOTE — Patient Instructions (Signed)
Pegfilgrastim injection What is this medicine? PEGFILGRASTIM (PEG fil gra stim) is a long-acting granulocyte colony-stimulating factor that stimulates the growth of neutrophils, a type of white blood cell important in the body's fight against infection. It is used to reduce the incidence of fever and infection in patients with certain types of cancer who are receiving chemotherapy that affects the bone marrow, and to increase survival after being exposed to high doses of radiation. This medicine may be used for other purposes; ask your health care provider or pharmacist if you have questions. COMMON BRAND NAME(S): Fulphila, Neulasta, Nyvepria, UDENYCA, Ziextenzo What should I tell my health care provider before I take this medicine? They need to know if you have any of these conditions:  kidney disease  latex allergy  ongoing radiation therapy  sickle cell disease  skin reactions to acrylic adhesives (On-Body Injector only)  an unusual or allergic reaction to pegfilgrastim, filgrastim, other medicines, foods, dyes, or preservatives  pregnant or trying to get pregnant  breast-feeding How should I use this medicine? This medicine is for injection under the skin. If you get this medicine at home, you will be taught how to prepare and give the pre-filled syringe or how to use the On-body Injector. Refer to the patient Instructions for Use for detailed instructions. Use exactly as directed. Tell your healthcare provider immediately if you suspect that the On-body Injector may not have performed as intended or if you suspect the use of the On-body Injector resulted in a missed or partial dose. It is important that you put your used needles and syringes in a special sharps container. Do not put them in a trash can. If you do not have a sharps container, call your pharmacist or healthcare provider to get one. Talk to your pediatrician regarding the use of this medicine in children. While this drug  may be prescribed for selected conditions, precautions do apply. Overdosage: If you think you have taken too much of this medicine contact a poison control center or emergency room at once. NOTE: This medicine is only for you. Do not share this medicine with others. What if I miss a dose? It is important not to miss your dose. Call your doctor or health care professional if you miss your dose. If you miss a dose due to an On-body Injector failure or leakage, a new dose should be administered as soon as possible using a single prefilled syringe for manual use. What may interact with this medicine? Interactions have not been studied. This list may not describe all possible interactions. Give your health care provider a list of all the medicines, herbs, non-prescription drugs, or dietary supplements you use. Also tell them if you smoke, drink alcohol, or use illegal drugs. Some items may interact with your medicine. What should I watch for while using this medicine? Your condition will be monitored carefully while you are receiving this medicine. You may need blood work done while you are taking this medicine. Talk to your health care provider about your risk of cancer. You may be more at risk for certain types of cancer if you take this medicine. If you are going to need a MRI, CT scan, or other procedure, tell your doctor that you are using this medicine (On-Body Injector only). What side effects may I notice from receiving this medicine? Side effects that you should report to your doctor or health care professional as soon as possible:  allergic reactions (skin rash, itching or hives, swelling of   the face, lips, or tongue)  back pain  dizziness  fever  pain, redness, or irritation at site where injected  pinpoint red spots on the skin  red or dark-brown urine  shortness of breath or breathing problems  stomach or side pain, or pain at the shoulder  swelling  tiredness  trouble  passing urine or change in the amount of urine  unusual bruising or bleeding Side effects that usually do not require medical attention (report to your doctor or health care professional if they continue or are bothersome):  bone pain  muscle pain This list may not describe all possible side effects. Call your doctor for medical advice about side effects. You may report side effects to FDA at 1-800-FDA-1088. Where should I keep my medicine? Keep out of the reach of children. If you are using this medicine at home, you will be instructed on how to store it. Throw away any unused medicine after the expiration date on the label. NOTE: This sheet is a summary. It may not cover all possible information. If you have questions about this medicine, talk to your doctor, pharmacist, or health care provider.  2021 Elsevier/Gold Standard (2019-09-29 13:20:51)  

## 2020-11-13 ENCOUNTER — Telehealth: Payer: Self-pay | Admitting: Hematology

## 2020-11-13 NOTE — Telephone Encounter (Signed)
Called patient regarding upcoming March appointment, patient is notified. 

## 2020-11-19 NOTE — Progress Notes (Signed)
HEMATOLOGY/ONCOLOGY CONSULTATION NOTE  Date of Service: 11/19/2020  Patient Care Team: [name], MD as PCP - General (Family Medicine)  CHIEF COMPLAINTS/PURPOSE OF CONSULTATION:  Continue mx of mantle cell lymphoma  HISTORY OF PRESENTING ILLNESS:   Brian Kaiser is a wonderful 65 y.o. male who has been referred to [place] by Dr. [name] for evaluation and management of concern for lymphoma. Pt is accompanied today by his wife, [name]. The pt reports that he is doing well overall.    The pt reports that last November he had an Upper Endoscopy because he was having abdominal bloating. Pt was found to have gastritis and was given Protonix and Pepcid to use as needed. Pt has not used Protonix and Pepcid in two months. His abdominal bloating has been intermittent since November, but has been better recently.   Last July pt had surgery on his right forearm and was experiencing difficulty sleeping due to him continuously rolling on the arm. This lasted for some time. Pt had his second COVID19 vaccine in February and his second Shingles vaccine two weeks later. After this he began experiencing fatigue, which is still present. His fatigue is not progressive and he is able to complete his daily task. Pt has drenching night sweats that started two months ago. These do not occur every night. He has lost 15 lbs in the last few months without effort, but started back going to the gym in May. Pt has noticed that left inguinal lymph node has fluctuated in size since he was diagnosed with Hepatitis B nearly 20 years ago. Recently the lymph node has grown in size and become more firm. He has also noticed an increase in the size of a right cervical lymph node. Pt has had left testicular discomfort in the last few weeks. He reports b/l ankle swelling, calf cramping and upper left abdominal pain with certain sleeping positions.  His Hepatitis B was picked up after he was experiencing abdominal discomfort and  was treated with milk thistle. It has been monitored via labwork over the years. Pt had a Basal Cell lesion on his left thigh and back a few months ago. They have been removed.   Of note prior to the patient's visit today, pt has had CT C/A/P ([id number]) completed on 06/06/2020 with results revealing "Extensive adenopathy in the chest, abdomen and pelvis. Marked splenomegaly. Findings most concerning for lymphoma. Small amount of free fluid in the abdomen and pelvis."   Most recent lab results (06/03/2020) of CBC w/diff & CMP is as follows: all values are WNL except for PLT at 135K. 06/03/2020 TSH at 7.86 06/03/2020 PSA at 0.20 06/03/2020 Sed Rate at 1  On review of systems, pt reports night sweats, new lumps/bumps, ankle swelling, calf cramping, constipation, change in color of bowel movements, left testicular pain, fatigue, unexpected weight loss, improving abdominal bloating, early satiety and denies SOB, chest pain, fevers, chills, bloody/black stools, headaches, vision changes, bone pain, back pain, flank pain and any other symptoms.   On PMHx the pt reports GERD, Forearm laceration, Right Forearm Ligament Repair, Hepatitis B, Hyperlipidemia. On Family Hx the pt reports that his brother was diagnosed with DLBCL a year ago.  INTERVAL HISTORY:  Brian Kaiser is a wonderful 65 y.o. male who is here for evaluation and management of his Mantle Cell Lymphoma. He is here prior to C5 Bendamustine/Rituxan. The patient's last visit with [place] was on 10/23/2020. He is accompanied here today by his wife. The pt reports  that he is doing well overall. He is here for C6.  The pt reports no issues tolerating the treatment. He is eating, drinking, sleeping well. The pt notes that his calves are very tight, but not bothersome.   Lab results today 11/20/2020 of CBC w/diff and CMP is as follows: all values are WNL except for HCT of 38.4.  On review of systems, pt denies back pain, abdominal pain, leg swelling,  SOB, and any other symptoms.  MEDICAL HISTORY:  Past Medical History:  Diagnosis Date  . Allergy   . Forearm laceration    right forearm  . GERD (gastroesophageal reflux disease)   . Hepatitis B   . Hyperlipidemia   . Pulmonary lesion    Left pulmonary schwannoma  . Schwannoma    left lower lung    SURGICAL HISTORY: Past Surgical History:  Procedure Laterality Date  . COLONOSCOPY    . LIGAMENT REPAIR Right 04/19/2019   Right forearm  . LIPOMA EXCISION Left    neck  . LUNG SURGERY Left   . OTHER SURGICAL HISTORY Left    pulmonary schwannoma excision  . UPPER GASTROINTESTINAL ENDOSCOPY    . WOUND EXPLORATION Right 04/19/2019   Procedure: WOUND EXPLORATION; REPAIR RIGHT FOREARM LACERATION;  Surgeon: Charlotte Crumb, MD;  Location: [facility];  Service: Orthopedics;  Laterality: Right;  AXILLARY BLOCK AND MAC    SOCIAL HISTORY: Social History   Socioeconomic History  . Marital status: Married    Spouse name: Not on file  . Number of children: Not on file  . Years of education: Not on file  . Highest education level: Not on file  Occupational History  . Not on file  Tobacco Use  . Smoking status: Never Smoker  . Smokeless tobacco: Never Used  Vaping Use  . Vaping Use: Never used  Substance and Sexual Activity  . Alcohol use: No    Alcohol/week: 0.0 standard drinks  . Drug use: No  . Sexual activity: Not on file  Other Topics Concern  . Not on file  Social History Narrative  . Not on file   Social Determinants of Health   Financial Resource Strain: Not on file  Food Insecurity: Not on file  Transportation Needs: Not on file  Physical Activity: Not on file  Stress: Not on file  Social Connections: Not on file  Intimate Partner Violence: Not on file    FAMILY HISTORY: Family History  Problem Relation Age of Onset  . Macular degeneration Mother   . Dementia Mother   . Diverticulitis Mother   . Prostate cancer Paternal Grandfather   .  Diverticulitis Brother   . Colon cancer Neg Hx   . Colon polyps Neg Hx   . Esophageal cancer Neg Hx   . Liver cancer Neg Hx   . Pancreatic cancer Neg Hx   . Rectal cancer Neg Hx   . Stomach cancer Neg Hx     ALLERGIES:  is allergic to augmentin [amoxicillin-pot clavulanate].  MEDICATIONS:  Current Outpatient Medications  Medication Sig Dispense Refill  . acetaminophen (TYLENOL) 325 MG tablet Take 325 mg by mouth every 6 (six) hours as needed for moderate pain or headache.    . allopurinol (ZYLOPRIM) 100 MG tablet Take 1 tablet (100 mg total) by mouth 2 (two) times daily. 60 tablet 0  . cholecalciferol (VITAMIN D3) 25 MCG (1000 UNIT) tablet Take 1,000 Units by mouth daily.    Marland Kitchen dexamethasone (DECADRON) 4 MG tablet Take 2  tablets (8 mg total) by mouth daily. Start the day after bendamustine chemotherapy for 2 days. Take with food. 30 tablet 1  . famotidine (PEPCID) 20 MG tablet Take 1 tablet (20 mg total) by mouth at bedtime. (Patient taking differently: Take 20 mg by mouth daily as needed for heartburn. ) 30 tablet 3  . LORazepam (ATIVAN) 0.5 MG tablet Take 1 tablet (0.5 mg total) by mouth every 8 (eight) hours as needed for anxiety. 30 tablet 0  . Multiple Vitamin (MULTIVITAMIN) tablet Take 1 tablet by mouth daily.    . Omega-3 Fatty Acids (FISH OIL) 1000 MG CAPS Take 1,000 mg by mouth daily.    . ondansetron (ZOFRAN) 8 MG tablet Take 1 tablet (8 mg total) by mouth 2 (two) times daily as needed for refractory nausea / vomiting. Start on day 2 after bendamustine chemo. 30 tablet 1  . pantoprazole (PROTONIX) 40 MG tablet Take 1 tablet (40 mg total) by mouth daily. 90 tablet 3  . prochlorperazine (COMPAZINE) 10 MG tablet Take 1 tablet (10 mg total) by mouth every 6 (six) hours as needed (Nausea or vomiting). 30 tablet 1  . vitamin C (ASCORBIC ACID) 250 MG tablet Take 250 mg by mouth daily.     No current facility-administered medications for this visit.    REVIEW OF SYSTEMS:   10 Point  review of Systems was done is negative except as noted above.  PHYSICAL EXAMINATION: ECOG PERFORMANCE STATUS: 1 - Symptomatic but completely ambulatory  Vitals:   11/20/20 1544  BP: 132/89  Pulse: 73  Resp: 14  Temp: 97.8 F (36.6 C)  SpO2: 100%   Filed Weights   11/20/20 1544  Weight: 187 lb 1.6 oz (84.9 kg)   .Body mass index is 27.63 kg/m.   GENERAL:alert, in no acute distress and comfortable SKIN: no acute rashes, no significant lesions EYES: conjunctiva are pink and non-injected, sclera anicteric OROPHARYNX: MMM, no exudates, no oropharyngeal erythema or ulceration NECK: supple, no JVD LYMPH:  no palpable lymphadenopathy in the cervical, axillary or inguinal regions LUNGS: clear to auscultation b/l with normal respiratory effort HEART: regular rate & rhythm ABDOMEN:  normoactive bowel sounds , non tender, not distended. Extremity: no pedal edema PSYCH: alert & oriented x 3 with fluent speech NEURO: no focal motor/sensory deficits  LABORATORY DATA:  I have reviewed the data as listed  . CBC Latest Ref Rng & Units 11/20/2020 10/23/2020 09/25/2020  WBC 4.0 - 10.5 K/uL 5.8 5.5 2.4(L)  Hemoglobin 13.0 - 17.0 g/dL 13.2 14.1 13.7  Hematocrit 39.0 - 52.0 % 38.4(L) 40.4 39.2  Platelets 150 - 400 K/uL 217 221 184   ANC 800 . CMP Latest Ref Rng & Units 11/20/2020 10/23/2020 09/25/2020  Glucose 70 - 99 mg/dL 90 85 77  BUN 8 - 23 mg/dL _0 Creatinine 0.61 - 1.24 mg/dL 1.05 1.10 1.12  Sodium 135 - 145 mmol/L 141 143 143  Potassium 3.5 - 5.1 mmol/L 4.1 4.0 4.1  Chloride 98 - 111 mmol/L 107 107 108  CO2 22 - 32 mmol/L _1 Calcium 8.9 - 10.3 mg/dL 8.9 9.2 9.2  Total Protein 6.5 - 8.1 g/dL 6.5 6.9 6.9  Total Bilirubin 0.3 - 1.2 mg/dL 0.5 0.5 0.5  Alkaline Phos 38 - 126 U/L 75 101 62  AST 15 - 41 U/L 40 27 36  ALT 0 - 44 U/L _2 06/20/2020 Right Axillary Surgical Pathology Report [id number]):    06/20/2020  Right Axillary Lymph Node Flow Pathology  ([id number]):   06/11/2020 Flow Pathology ([id number]):   RADIOGRAPHIC STUDIES: I have personally reviewed the radiological images as listed and agreed with the findings in the report. No results found.  ASSESSMENT & PLAN:   65 yo with   1) Recently diagnosed stage IV mantle cell lymphoma with extensive lymphadenopathy and massive splenomegaly . 2) s/p massive splenomegaly likely related to mantle cell lymphoma  3) thrombocytopenia-mild platelets of 132k likely related to lymphoma. 4) hepatitis B core antibody positive, surface antigen negative, HBV DNA PCR negative 5) elevated LDH due to lymphoma  PLAN: -Discussed pt labwork today, 11/20/2020; blood counts completely normal, chemistries all completely normal.  -Advised pt that we will get PET scan in 6 weeks. Pt is already in clinical remission given last scans and has complete response. -Re-discussed transplant potential. The pt does not wish to get this still. Will continue with maintenance Rituxan as opposed to this option. -Advised pt that MRD test does not change the maintenance recommendations at this point. -Discussed Car-T cell therapy and potential other cellular therapy options down the line in the future. -Advised that this is treatable but not curative with Rituxan. The goal is to increase the time to progression.  -Advised pt that this current cycle 6 is the last planned cycle. Will complete at this time. -Advised pt we will start the maintenance in Rituxan in 2-3 months. Provided discussion on priorities on life and desires for travel and dealing with treatment as pt wishes.  -Discussed CDC guidelines as related to COVID 19 vaccine and booster suggestions. Advised there is no second booster shot at this time.  -Discussed [place] and pt's eligibility. The pt is aware of this if desire potential travels. -Recommended pt wait three months prior to dental visit for routine dental cares. Pt is okay to get the squamous  cell cancer spot checked out at dermatologist at this time. -Advised pt to be as physically active as desired. Practice caution if desiring to workout in indoor gym and take precautions needed for infections. Wait around a month prior to deciding. -Advised pt that outside of the pandemic, the Rituxan is not as immunosuppressive as many treatments.  -Advised pt that Udenyca shot will not be needed in maintenance treatment.  -The pt has no prohibitive toxicities from continuing C6D1 Bendamustine + Rituxan tomorrow. -Continue Claritin to block discomfort from [place] shot. Advise pt he can use Benadryl in addition if itching or rash again. -Will see back in 8 weeks. PET in 6-7 weeks.    FOLLOW UP: PET/CT in 7 weeks RTC with Dr Irene Limbo with labs in 8 weeks   The total time spent in the appointment was 30 minutes and more than 50% was on counseling and direct patient cares, ordering and management of chemotherapy.   All of the patient's questions were answered with apparent satisfaction. The patient knows to call the clinic with any problems, questions or concerns.    Sullivan Lone MD [place] AAHIVMS [facility] Hematology/Oncology Physician [facility]  (Office):       [phone] (Work cell):  [phone] (Fax):           [phone]  11/19/2020 12:41 PM  I, Reinaldo Raddle, am acting as scribe for Dr. Sullivan Lone, MD.   .I have reviewed the above documentation for accuracy and completeness, and I agree with the above. Brunetta Genera MD

## 2020-11-20 ENCOUNTER — Ambulatory Visit: Payer: 59 | Admitting: Hematology

## 2020-11-20 ENCOUNTER — Inpatient Hospital Stay (HOSPITAL_BASED_OUTPATIENT_CLINIC_OR_DEPARTMENT_OTHER): Payer: 59 | Admitting: Hematology

## 2020-11-20 ENCOUNTER — Other Ambulatory Visit: Payer: Self-pay

## 2020-11-20 ENCOUNTER — Other Ambulatory Visit: Payer: 59

## 2020-11-20 ENCOUNTER — Inpatient Hospital Stay: Payer: 59 | Attending: Hematology

## 2020-11-20 VITALS — BP 132/89 | HR 73 | Temp 97.8°F | Resp 14 | Ht 69.0 in | Wt 187.1 lb

## 2020-11-20 DIAGNOSIS — Z8042 Family history of malignant neoplasm of prostate: Secondary | ICD-10-CM | POA: Insufficient documentation

## 2020-11-20 DIAGNOSIS — C8318 Mantle cell lymphoma, lymph nodes of multiple sites: Secondary | ICD-10-CM | POA: Diagnosis not present

## 2020-11-20 DIAGNOSIS — C8314 Mantle cell lymphoma, lymph nodes of axilla and upper limb: Secondary | ICD-10-CM | POA: Diagnosis not present

## 2020-11-20 DIAGNOSIS — Z5112 Encounter for antineoplastic immunotherapy: Secondary | ICD-10-CM | POA: Diagnosis not present

## 2020-11-20 DIAGNOSIS — Z5111 Encounter for antineoplastic chemotherapy: Secondary | ICD-10-CM

## 2020-11-20 DIAGNOSIS — R7402 Elevation of levels of lactic acid dehydrogenase (LDH): Secondary | ICD-10-CM | POA: Insufficient documentation

## 2020-11-20 DIAGNOSIS — Z5189 Encounter for other specified aftercare: Secondary | ICD-10-CM | POA: Insufficient documentation

## 2020-11-20 DIAGNOSIS — D696 Thrombocytopenia, unspecified: Secondary | ICD-10-CM | POA: Insufficient documentation

## 2020-11-20 DIAGNOSIS — Z7189 Other specified counseling: Secondary | ICD-10-CM

## 2020-11-20 DIAGNOSIS — Z79899 Other long term (current) drug therapy: Secondary | ICD-10-CM | POA: Diagnosis not present

## 2020-11-20 LAB — COMPREHENSIVE METABOLIC PANEL
ALT: 31 U/L (ref 0–44)
AST: 40 U/L (ref 15–41)
Albumin: 3.7 g/dL (ref 3.5–5.0)
Alkaline Phosphatase: 75 U/L (ref 38–126)
Anion gap: 8 (ref 5–15)
BUN: 12 mg/dL (ref 8–23)
CO2: 26 mmol/L (ref 22–32)
Calcium: 8.9 mg/dL (ref 8.9–10.3)
Chloride: 107 mmol/L (ref 98–111)
Creatinine, Ser: 1.05 mg/dL (ref 0.61–1.24)
GFR, Estimated: >60 mL/min (ref >60)
Glucose, Bld: 90 mg/dL (ref 70–99)
Potassium: 4.1 mmol/L (ref 3.5–5.1)
Sodium: 141 mmol/L (ref 135–145)
Total Bilirubin: 0.5 mg/dL (ref 0.3–1.2)
Total Protein: 6.5 g/dL (ref 6.5–8.1)

## 2020-11-20 LAB — CBC WITH DIFFERENTIAL/PLATELET
Abs Immature Granulocytes: 0.01 10*3/uL (ref 0.00–0.07)
Basophils Absolute: 0.1 10*3/uL (ref 0.0–0.1)
Basophils Relative: 1 %
Eosinophils Absolute: 0.1 10*3/uL (ref 0.0–0.5)
Eosinophils Relative: 2 %
HCT: 38.4 % — ABNORMAL LOW (ref 39.0–52.0)
Hemoglobin: 13.2 g/dL (ref 13.0–17.0)
Immature Granulocytes: 0 %
Lymphocytes Relative: 19 %
Lymphs Abs: 1.1 10*3/uL (ref 0.7–4.0)
MCH: 30.8 pg (ref 26.0–34.0)
MCHC: 34.4 g/dL (ref 30.0–36.0)
MCV: 89.5 fL (ref 80.0–100.0)
Monocytes Absolute: 0.6 10*3/uL (ref 0.1–1.0)
Monocytes Relative: 11 %
Neutro Abs: 3.9 10*3/uL (ref 1.7–7.7)
Neutrophils Relative %: 67 %
Platelets: 217 10*3/uL (ref 150–400)
RBC: 4.29 MIL/uL (ref 4.22–5.81)
RDW: 15.1 % (ref 11.5–15.5)
WBC: 5.8 10*3/uL (ref 4.0–10.5)
nRBC: 0 % (ref 0.0–0.2)

## 2020-11-21 ENCOUNTER — Inpatient Hospital Stay: Payer: 59

## 2020-11-21 VITALS — BP 131/76 | HR 62 | Temp 98.2°F | Resp 17

## 2020-11-21 DIAGNOSIS — Z7189 Other specified counseling: Secondary | ICD-10-CM

## 2020-11-21 DIAGNOSIS — Z5112 Encounter for antineoplastic immunotherapy: Secondary | ICD-10-CM | POA: Diagnosis not present

## 2020-11-21 DIAGNOSIS — Z79899 Other long term (current) drug therapy: Secondary | ICD-10-CM | POA: Diagnosis not present

## 2020-11-21 DIAGNOSIS — Z5189 Encounter for other specified aftercare: Secondary | ICD-10-CM | POA: Diagnosis not present

## 2020-11-21 DIAGNOSIS — D696 Thrombocytopenia, unspecified: Secondary | ICD-10-CM | POA: Diagnosis not present

## 2020-11-21 DIAGNOSIS — R7402 Elevation of levels of lactic acid dehydrogenase (LDH): Secondary | ICD-10-CM | POA: Diagnosis not present

## 2020-11-21 DIAGNOSIS — Z8042 Family history of malignant neoplasm of prostate: Secondary | ICD-10-CM | POA: Diagnosis not present

## 2020-11-21 DIAGNOSIS — Z5111 Encounter for antineoplastic chemotherapy: Secondary | ICD-10-CM | POA: Diagnosis not present

## 2020-11-21 DIAGNOSIS — C8314 Mantle cell lymphoma, lymph nodes of axilla and upper limb: Secondary | ICD-10-CM | POA: Diagnosis not present

## 2020-11-21 DIAGNOSIS — C8318 Mantle cell lymphoma, lymph nodes of multiple sites: Secondary | ICD-10-CM

## 2020-11-21 MED ORDER — SODIUM CHLORIDE 0.9 % IV SOLN
10.0000 mg | Freq: Once | INTRAVENOUS | Status: AC
  Administered 2020-11-21: 10 mg via INTRAVENOUS
  Filled 2020-11-21: qty 10

## 2020-11-21 MED ORDER — DIPHENHYDRAMINE HCL 25 MG PO CAPS
50.0000 mg | ORAL_CAPSULE | Freq: Once | ORAL | Status: AC
  Administered 2020-11-21: 50 mg via ORAL

## 2020-11-21 MED ORDER — SODIUM CHLORIDE 0.9 % IV SOLN
90.0000 mg/m2 | Freq: Once | INTRAVENOUS | Status: AC
  Administered 2020-11-21: 175 mg via INTRAVENOUS
  Filled 2020-11-21: qty 7

## 2020-11-21 MED ORDER — ACETAMINOPHEN 325 MG PO TABS
650.0000 mg | ORAL_TABLET | Freq: Once | ORAL | Status: AC
Start: 2020-11-21 — End: 2020-11-21
  Administered 2020-11-21: 650 mg via ORAL

## 2020-11-21 MED ORDER — FAMOTIDINE IN NACL 20-0.9 MG/50ML-% IV SOLN
INTRAVENOUS | Status: AC
  Filled 2020-11-21: qty 50

## 2020-11-21 MED ORDER — SODIUM CHLORIDE 0.9 % IV SOLN
375.0000 mg/m2 | Freq: Once | INTRAVENOUS | Status: AC
  Administered 2020-11-21: 700 mg via INTRAVENOUS
  Filled 2020-11-21: qty 50

## 2020-11-21 MED ORDER — SODIUM CHLORIDE 0.9 % IV SOLN
Freq: Once | INTRAVENOUS | Status: AC
  Filled 2020-11-21: qty 250

## 2020-11-21 MED ORDER — FAMOTIDINE IN NACL 20-0.9 MG/50ML-% IV SOLN
20.0000 mg | Freq: Once | INTRAVENOUS | Status: AC
Start: 2020-11-21 — End: 2020-11-21
  Administered 2020-11-21: 20 mg via INTRAVENOUS

## 2020-11-21 MED ORDER — PALONOSETRON HCL INJECTION 0.25 MG/5ML
INTRAVENOUS | Status: AC
  Filled 2020-11-21: qty 5

## 2020-11-21 MED ORDER — PALONOSETRON HCL INJECTION 0.25 MG/5ML
0.2500 mg | Freq: Once | INTRAVENOUS | Status: AC
  Administered 2020-11-21: 0.25 mg via INTRAVENOUS

## 2020-11-21 MED ORDER — DIPHENHYDRAMINE HCL 25 MG PO CAPS
ORAL_CAPSULE | ORAL | Status: AC
  Filled 2020-11-21: qty 2

## 2020-11-21 MED ORDER — ACETAMINOPHEN 325 MG PO TABS
ORAL_TABLET | ORAL | Status: AC
  Filled 2020-11-21: qty 2

## 2020-11-21 NOTE — Patient Instructions (Signed)
[facility] Discharge Instructions for Patients Receiving Chemotherapy  Today you received the following chemotherapy agents Rituxan, Bendeka.  To help prevent nausea and vomiting after your treatment, we encourage you to take your nausea medication as indicated by your MD.   If you develop nausea and vomiting that is not controlled by your nausea medication, call the clinic.   BELOW ARE SYMPTOMS THAT SHOULD BE REPORTED IMMEDIATELY:  *FEVER GREATER THAN 100.5 F  *CHILLS WITH OR WITHOUT FEVER  NAUSEA AND VOMITING THAT IS NOT CONTROLLED WITH YOUR NAUSEA MEDICATION  *UNUSUAL SHORTNESS OF BREATH  *UNUSUAL BRUISING OR BLEEDING  TENDERNESS IN MOUTH AND THROAT WITH OR WITHOUT PRESENCE OF ULCERS  *URINARY PROBLEMS  *BOWEL PROBLEMS  UNUSUAL RASH Items with * indicate a potential emergency and should be followed up as soon as possible.  Feel free to call the clinic should you have any questions or concerns. The clinic phone number is [phone].  Please show the [place] at check-in to the Emergency Department and triage nurse.

## 2020-11-22 ENCOUNTER — Other Ambulatory Visit: Payer: Self-pay

## 2020-11-22 ENCOUNTER — Inpatient Hospital Stay: Payer: 59

## 2020-11-22 VITALS — BP 154/93 | HR 76 | Temp 97.7°F

## 2020-11-22 DIAGNOSIS — R7402 Elevation of levels of lactic acid dehydrogenase (LDH): Secondary | ICD-10-CM | POA: Diagnosis not present

## 2020-11-22 DIAGNOSIS — Z8042 Family history of malignant neoplasm of prostate: Secondary | ICD-10-CM | POA: Diagnosis not present

## 2020-11-22 DIAGNOSIS — Z5111 Encounter for antineoplastic chemotherapy: Secondary | ICD-10-CM | POA: Diagnosis not present

## 2020-11-22 DIAGNOSIS — D696 Thrombocytopenia, unspecified: Secondary | ICD-10-CM | POA: Diagnosis not present

## 2020-11-22 DIAGNOSIS — Z7189 Other specified counseling: Secondary | ICD-10-CM

## 2020-11-22 DIAGNOSIS — Z5112 Encounter for antineoplastic immunotherapy: Secondary | ICD-10-CM | POA: Diagnosis not present

## 2020-11-22 DIAGNOSIS — Z79899 Other long term (current) drug therapy: Secondary | ICD-10-CM | POA: Diagnosis not present

## 2020-11-22 DIAGNOSIS — C8318 Mantle cell lymphoma, lymph nodes of multiple sites: Secondary | ICD-10-CM

## 2020-11-22 DIAGNOSIS — C8314 Mantle cell lymphoma, lymph nodes of axilla and upper limb: Secondary | ICD-10-CM | POA: Diagnosis not present

## 2020-11-22 DIAGNOSIS — Z5189 Encounter for other specified aftercare: Secondary | ICD-10-CM | POA: Diagnosis not present

## 2020-11-22 MED ORDER — SODIUM CHLORIDE 0.9 % IV SOLN
Freq: Once | INTRAVENOUS | Status: AC
  Filled 2020-11-22: qty 250

## 2020-11-22 MED ORDER — SODIUM CHLORIDE 0.9 % IV SOLN
90.0000 mg/m2 | Freq: Once | INTRAVENOUS | Status: AC
  Administered 2020-11-22: 175 mg via INTRAVENOUS
  Filled 2020-11-22: qty 7

## 2020-11-22 MED ORDER — SODIUM CHLORIDE 0.9 % IV SOLN
10.0000 mg | Freq: Once | INTRAVENOUS | Status: AC
  Administered 2020-11-22: 10 mg via INTRAVENOUS
  Filled 2020-11-22: qty 10

## 2020-11-22 NOTE — Patient Instructions (Signed)
Bendamustine Injection What is this medicine? BENDAMUSTINE (BEN da MUS teen) is a chemotherapy drug. It is used to treat chronic lymphocytic leukemia and non-Hodgkin lymphoma. This medicine may be used for other purposes; ask your health care provider or pharmacist if you have questions. COMMON BRAND NAME(S): BELRAPZO, BENDEKA, Treanda What should I tell my health care provider before I take this medicine? They need to know if you have any of these conditions:  infection (especially a virus infection such as chickenpox, cold sores, or herpes)  kidney disease  liver disease  an unusual or allergic reaction to bendamustine, mannitol, other medicines, foods, dyes, or preservatives  pregnant or trying to get pregnant  breast-feeding How should I use this medicine? This medicine is for infusion into a vein. It is given by a health care professional in a hospital or clinic setting. Talk to your pediatrician regarding the use of this medicine in children. Special care may be needed. Overdosage: If you think you have taken too much of this medicine contact a poison control center or emergency room at once. NOTE: This medicine is only for you. Do not share this medicine with others. What if I miss a dose? It is important not to miss your dose. Call your doctor or health care professional if you are unable to keep an appointment. What may interact with this medicine? Do not take this medicine with any of the following medications:  clozapine This medicine may also interact with the following medications:  atazanavir  cimetidine  ciprofloxacin  enoxacin  fluvoxamine  medicines for seizures like carbamazepine and phenobarbital  mexiletine  rifampin  tacrine  thiabendazole  zileuton This list may not describe all possible interactions. Give your health care provider a list of all the medicines, herbs, non-prescription drugs, or dietary supplements you use. Also tell them if  you smoke, drink alcohol, or use illegal drugs. Some items may interact with your medicine. What should I watch for while using this medicine? This drug may make you feel generally unwell. This is not uncommon, as chemotherapy can affect healthy cells as well as cancer cells. Report any side effects. Continue your course of treatment even though you feel ill unless your doctor tells you to stop. You may need blood work done while you are taking this medicine. Call your doctor or healthcare provider for advice if you get a fever, chills or sore throat, or other symptoms of a cold or flu. Do not treat yourself. This drug decreases your body's ability to fight infections. Try to avoid being around people who are sick. This medicine may cause serious skin reactions. They can happen weeks to months after starting the medicine. Contact your healthcare provider right away if you notice fevers or flu-like symptoms with a rash. The rash may be red or purple and then turn into blisters or peeling of the skin. Or, you might notice a red rash with swelling of the face, lips or lymph nodes in your neck or under your arms. In some patients, this medicine may cause a serious brain infection that may cause death. If you have any problems seeing, thinking, speaking, walking, or standing, tell your health care provider right away. If you cannot reach your health care provider, urgently seek other source of medical care. This medicine may increase your risk to bruise or bleed. Call your doctor or healthcare provider if you notice any unusual bleeding. Talk to your doctor about your risk of cancer. You may be more at   risk for certain types of cancers if you take this medicine. This medicine may increase your risk of skin cancer. Check your skin for changes to moles or for new growths while taking this medicine. Call your health care provider if you notice any of these skin changes. Do not become pregnant while taking this  medicine or for at least 6 months after stopping it. Women should inform their doctor if they wish to become pregnant or think they might be pregnant. Men should not father a child while taking this medicine and for at least 3 months after stopping it. There is a potential for serious side effects to an unborn child. Talk to your healthcare provider or pharmacist for more information. Do not breast-feed an infant while taking this medicine or for at least 1 week after stopping it. This medicine may make it more difficult to father a child. You should talk with your doctor or healthcare provider if you are concerned about your fertility. What side effects may I notice from receiving this medicine? Side effects that you should report to your doctor or health care professional as soon as possible:  allergic reactions like skin rash, itching or hives, swelling of the face, lips, or tongue  low blood counts - this medicine may decrease the number of white blood cells, red blood cells and platelets. You may be at increased risk for infections and bleeding.  rash, fever, and swollen lymph nodes  redness, blistering, peeling, or loosening of the skin, including inside the mouth  signs of infection like fever or chills, cough, sore throat, pain or difficulty passing urine  signs of decreased platelets or bleeding like bruising, pinpoint red spots on the skin, black, tarry stools, blood in the urine  signs of decreased red blood cells like being unusually weak or tired, fainting spells, lightheadedness  signs and symptoms of kidney injury like trouble passing urine or change in the amount of urine  signs and symptoms of liver injury like dark yellow or brown urine; general ill feeling or flu-like symptoms; light-colored stools; loss of appetite; nausea; right upper belly pain; unusually weak or tired; yellowing of the eyes or skin Side effects that usually do not require medical attention (report to your  doctor or health care professional if they continue or are bothersome):  constipation  decreased appetite  diarrhea  headache  mouth sores  nausea, vomiting  tiredness This list may not describe all possible side effects. Call your doctor for medical advice about side effects. You may report side effects to FDA at 1-800-FDA-1088. Where should I keep my medicine? This drug is given in a hospital or clinic and will not be stored at home. NOTE: This sheet is a summary. It may not cover all possible information. If you have questions about this medicine, talk to your doctor, pharmacist, or health care provider.  2021 Elsevier/Gold Standard (2020-03-04 12:11:43)  

## 2020-11-25 ENCOUNTER — Inpatient Hospital Stay: Payer: 59

## 2020-11-25 ENCOUNTER — Other Ambulatory Visit: Payer: Self-pay

## 2020-11-25 VITALS — BP 136/71 | HR 54 | Temp 98.5°F | Resp 17

## 2020-11-25 DIAGNOSIS — D696 Thrombocytopenia, unspecified: Secondary | ICD-10-CM | POA: Diagnosis not present

## 2020-11-25 DIAGNOSIS — Z5111 Encounter for antineoplastic chemotherapy: Secondary | ICD-10-CM | POA: Diagnosis not present

## 2020-11-25 DIAGNOSIS — Z8042 Family history of malignant neoplasm of prostate: Secondary | ICD-10-CM | POA: Diagnosis not present

## 2020-11-25 DIAGNOSIS — Z5112 Encounter for antineoplastic immunotherapy: Secondary | ICD-10-CM | POA: Diagnosis not present

## 2020-11-25 DIAGNOSIS — C8318 Mantle cell lymphoma, lymph nodes of multiple sites: Secondary | ICD-10-CM

## 2020-11-25 DIAGNOSIS — Z79899 Other long term (current) drug therapy: Secondary | ICD-10-CM | POA: Diagnosis not present

## 2020-11-25 DIAGNOSIS — Z7189 Other specified counseling: Secondary | ICD-10-CM

## 2020-11-25 DIAGNOSIS — C8314 Mantle cell lymphoma, lymph nodes of axilla and upper limb: Secondary | ICD-10-CM | POA: Diagnosis not present

## 2020-11-25 DIAGNOSIS — R7402 Elevation of levels of lactic acid dehydrogenase (LDH): Secondary | ICD-10-CM | POA: Diagnosis not present

## 2020-11-25 DIAGNOSIS — Z5189 Encounter for other specified aftercare: Secondary | ICD-10-CM | POA: Diagnosis not present

## 2020-11-25 MED ORDER — PEGFILGRASTIM-BMEZ 6 MG/0.6ML ~~LOC~~ SOSY
6.0000 mg | PREFILLED_SYRINGE | Freq: Once | SUBCUTANEOUS | Status: AC
  Administered 2020-11-25: 6 mg via SUBCUTANEOUS

## 2020-11-25 NOTE — Patient Instructions (Signed)

## 2020-11-26 ENCOUNTER — Other Ambulatory Visit (HOSPITAL_COMMUNITY): Payer: Self-pay | Admitting: Pharmacist

## 2020-11-29 ENCOUNTER — Telehealth: Payer: Self-pay | Admitting: *Deleted

## 2020-11-29 ENCOUNTER — Telehealth: Payer: Self-pay | Admitting: Hematology

## 2020-11-29 NOTE — Telephone Encounter (Signed)
Rescheduled upcoming appointment per 3/11 schedule message. Patient is aware of changes.

## 2020-11-29 NOTE — Telephone Encounter (Signed)
Received Secure Email from patient [facility] Employee):   My lab and appointment with Dr. Irene Limbo are scheduled for April 28th at 10am / 10:40am.  I emailed and asked that it be rescheduled as late in the day as possible, and I received no response.  Is it possible that this could be moved to as late in the day as possible? Could I go ahead and have my infusion for immunotherapy scheduled for April 29th?  [place], however I want to keep my priorities in order. I assume the pet scan will be scheduled before April 28th once insurance has been approved?   Response to patient:  The request to move your appts later in the day was sent to scheduling after I received your last email. It may just be the only opening on the day that Dr. Irene Limbo requested to see you. I've sent the schedulers a message and asked if they can look at it and reschedule if possible. Right now, you do not have an infusion ordered by Dr. Irene Limbo, so there is nothing for them to schedule. Based on his note from your last OV, his plan is to review your PET scan and labs on 4/28.  Dr. Irene Limbo asked to have PET scheduled the week of 4/11 so that he will have results by your appt.  They should call you about 10 days prior to that week to schedule.

## 2020-12-11 ENCOUNTER — Other Ambulatory Visit (HOSPITAL_BASED_OUTPATIENT_CLINIC_OR_DEPARTMENT_OTHER): Payer: Self-pay

## 2020-12-25 DIAGNOSIS — L989 Disorder of the skin and subcutaneous tissue, unspecified: Secondary | ICD-10-CM | POA: Diagnosis not present

## 2020-12-25 DIAGNOSIS — C44519 Basal cell carcinoma of skin of other part of trunk: Secondary | ICD-10-CM | POA: Diagnosis not present

## 2020-12-25 DIAGNOSIS — L905 Scar conditions and fibrosis of skin: Secondary | ICD-10-CM | POA: Diagnosis not present

## 2020-12-25 DIAGNOSIS — D225 Melanocytic nevi of trunk: Secondary | ICD-10-CM | POA: Diagnosis not present

## 2020-12-25 DIAGNOSIS — D485 Neoplasm of uncertain behavior of skin: Secondary | ICD-10-CM | POA: Diagnosis not present

## 2020-12-25 DIAGNOSIS — L570 Actinic keratosis: Secondary | ICD-10-CM | POA: Diagnosis not present

## 2020-12-25 DIAGNOSIS — L814 Other melanin hyperpigmentation: Secondary | ICD-10-CM | POA: Diagnosis not present

## 2020-12-25 DIAGNOSIS — Z85828 Personal history of other malignant neoplasm of skin: Secondary | ICD-10-CM | POA: Diagnosis not present

## 2020-12-26 ENCOUNTER — Other Ambulatory Visit (HOSPITAL_COMMUNITY): Payer: Self-pay

## 2020-12-26 MED ORDER — PANTOPRAZOLE SODIUM 20 MG PO TBEC
20.0000 mg | DELAYED_RELEASE_TABLET | Freq: Every day | ORAL | 1 refills | Status: DC
  Filled 2020-12-26: qty 90, 90d supply, fill #0
  Filled 2021-04-07: qty 90, 90d supply, fill #1

## 2020-12-27 ENCOUNTER — Other Ambulatory Visit (HOSPITAL_COMMUNITY): Payer: Self-pay

## 2021-01-02 ENCOUNTER — Other Ambulatory Visit (HOSPITAL_COMMUNITY): Payer: Self-pay

## 2021-01-08 ENCOUNTER — Telehealth: Payer: Self-pay | Admitting: Hematology

## 2021-01-08 ENCOUNTER — Encounter: Payer: Self-pay | Admitting: Hematology

## 2021-01-08 NOTE — Telephone Encounter (Signed)
R/s appts per 4/20 sch msg. Pt aware.  

## 2021-01-09 DIAGNOSIS — D485 Neoplasm of uncertain behavior of skin: Secondary | ICD-10-CM | POA: Diagnosis not present

## 2021-01-09 DIAGNOSIS — L905 Scar conditions and fibrosis of skin: Secondary | ICD-10-CM | POA: Diagnosis not present

## 2021-01-09 DIAGNOSIS — D0359 Melanoma in situ of other part of trunk: Secondary | ICD-10-CM | POA: Diagnosis not present

## 2021-01-09 DIAGNOSIS — D225 Melanocytic nevi of trunk: Secondary | ICD-10-CM | POA: Diagnosis not present

## 2021-01-13 ENCOUNTER — Other Ambulatory Visit: Payer: Self-pay

## 2021-01-13 ENCOUNTER — Ambulatory Visit: Payer: 59 | Admitting: Hematology

## 2021-01-13 ENCOUNTER — Telehealth: Payer: Self-pay

## 2021-01-13 ENCOUNTER — Encounter: Payer: Self-pay | Admitting: Hematology

## 2021-01-13 ENCOUNTER — Other Ambulatory Visit: Payer: 59

## 2021-01-13 ENCOUNTER — Ambulatory Visit (HOSPITAL_COMMUNITY)
Admission: RE | Admit: 2021-01-13 | Discharge: 2021-01-13 | Disposition: A | Discharge status: Home / Self Care | Payer: 59 | Source: Ambulatory Visit | Attending: Hematology | Admitting: Hematology

## 2021-01-13 DIAGNOSIS — C8318 Mantle cell lymphoma, lymph nodes of multiple sites: Secondary | ICD-10-CM | POA: Insufficient documentation

## 2021-01-13 DIAGNOSIS — Z5111 Encounter for antineoplastic chemotherapy: Secondary | ICD-10-CM | POA: Diagnosis not present

## 2021-01-13 DIAGNOSIS — C8310 Mantle cell lymphoma, unspecified site: Secondary | ICD-10-CM | POA: Diagnosis not present

## 2021-01-13 DIAGNOSIS — Z20822 Contact with and (suspected) exposure to covid-19: Secondary | ICD-10-CM | POA: Diagnosis not present

## 2021-01-13 LAB — GLUCOSE, CAPILLARY: Glucose-Capillary: 99 mg/dL (ref 70–99)

## 2021-01-13 MED ORDER — FLUDEOXYGLUCOSE F - 18 (FDG) INJECTION: 9.5000 | Freq: Once | INTRAVENOUS | Status: DC | PRN

## 2021-01-13 NOTE — Progress Notes (Signed)
HEMATOLOGY/ONCOLOGY CONSULTATION NOTE  Date of Service: 01/13/2021  Patient Care Team: [name], MD as PCP - General (Family Medicine)  CHIEF COMPLAINTS/PURPOSE OF CONSULTATION:  Continue mx of mantle cell lymphoma  HISTORY OF PRESENTING ILLNESS:   Brian Kaiser is a wonderful 65 y.o. male who has been referred to [place] by Dr. [name] for evaluation and management of concern for lymphoma. Pt is accompanied today by his wife, Brian Kaiser. The pt reports that he is doing well overall.    The pt reports that last November he had an Upper Endoscopy because he was having abdominal bloating. Pt was found to have gastritis and was given Protonix and Pepcid to use as needed. Pt has not used Protonix and Pepcid in two months. His abdominal bloating has been intermittent since November, but has been better recently.   Last July pt had surgery on his right forearm and was experiencing difficulty sleeping due to him continuously rolling on the arm. This lasted for some time. Pt had his second COVID19 vaccine in February and his second Shingles vaccine two weeks later. After this he began experiencing fatigue, which is still present. His fatigue is not progressive and he is able to complete his daily task. Pt has drenching night sweats that started two months ago. These do not occur every night. He has lost 15 lbs in the last few months without effort, but started back going to the gym in May. Pt has noticed that left inguinal lymph node has fluctuated in size since he was diagnosed with Hepatitis B nearly 20 years ago. Recently the lymph node has grown in size and become more firm. He has also noticed an increase in the size of a right cervical lymph node. Pt has had left testicular discomfort in the last few weeks. He reports b/l ankle swelling, calf cramping and upper left abdominal pain with certain sleeping positions.  His Hepatitis B was picked up after he was experiencing abdominal discomfort and  was treated with milk thistle. It has been monitored via labwork over the years. Pt had a Basal Cell lesion on his left thigh and back a few months ago. They have been removed.   Of note prior to the patient's visit today, pt has had CT C/A/P ([id number]) completed on 06/06/2020 with results revealing "Extensive adenopathy in the chest, abdomen and pelvis. Marked splenomegaly. Findings most concerning for lymphoma. Small amount of free fluid in the abdomen and pelvis."   Most recent lab results (06/03/2020) of CBC w/diff & CMP is as follows: all values are WNL except for PLT at 135K. 06/03/2020 TSH at 7.86 06/03/2020 PSA at 0.20 06/03/2020 Sed Rate at 1  On review of systems, pt reports night sweats, new lumps/bumps, ankle swelling, calf cramping, constipation, change in color of bowel movements, left testicular pain, fatigue, unexpected weight loss, improving abdominal bloating, early satiety and denies SOB, chest pain, fevers, chills, bloody/black stools, headaches, vision changes, bone pain, back pain, flank pain and any other symptoms.   On PMHx the pt reports GERD, Forearm laceration, Right Forearm Ligament Repair, Hepatitis B, Hyperlipidemia. On Family Hx the pt reports that his brother was diagnosed with DLBCL a year ago.  INTERVAL HISTORY:  Brian Kaiser is a wonderful 65 y.o. male who is here for evaluation and management of his Mantle Cell Lymphoma. He is here prior to C5 Bendamustine/Rituxan. The patient's last visit with [place] was on 11/20/2020. He is accompanied here today by his wife. The pt reports  that he is doing well overall.  The pt reports no new concerns after finishing his last cycle of treatment. The pt still notes some intermittent swelling and stiffness in his hands after he wakes up, improved by walking around and water. The pt notes some intermittent swelling in his legs still as well. The pt notes that he has an atypical nevus on his shoulder area that is going to be removed  soon. The pt notes that he has multiple basal cells on hs back that are and will be removed soon as well. The pt notes that he experienced much cumulative fatigue with the last cycle of treatment.  Of note since the patient's last visit, pt has had PET Skull Base to Thigh ([id number]) on 01/13/2021, which revealed "1. No evidence of recurrent lymphoma. 2.  Aortic atherosclerosis (ICD10-I70.0)."  Lab results today 01/14/2021 of CBC w/diff and CMP is as follows: all values are WNL. 01/14/2021 LDH of 230.  On review of systems, pt reports basal cells on back, atypical nevus on shoulder region, continued stiffness and swelling in hands/legs intermittently and denies acute neuropathy, new lumps/bumps, back pain, abdominal pain, leg swelling, and any other symptoms.  MEDICAL HISTORY:  Past Medical History:  Diagnosis Date  . Allergy   . Forearm laceration    right forearm  . GERD (gastroesophageal reflux disease)   . Hepatitis B   . Hyperlipidemia   . Pulmonary lesion    Left pulmonary schwannoma  . Schwannoma    left lower lung    SURGICAL HISTORY: Past Surgical History:  Procedure Laterality Date  . COLONOSCOPY    . LIGAMENT REPAIR Right 04/19/2019   Right forearm  . LIPOMA EXCISION Left    neck  . LUNG SURGERY Left   . OTHER SURGICAL HISTORY Left    pulmonary schwannoma excision  . UPPER GASTROINTESTINAL ENDOSCOPY    . WOUND EXPLORATION Right 04/19/2019   Procedure: WOUND EXPLORATION; REPAIR RIGHT FOREARM LACERATION;  Surgeon: Charlotte Crumb, MD;  Location: [facility];  Service: Orthopedics;  Laterality: Right;  AXILLARY BLOCK AND MAC    SOCIAL HISTORY: Social History   Socioeconomic History  . Marital status: Married    Spouse name: Not on file  . Number of children: Not on file  . Years of education: Not on file  . Highest education level: Not on file  Occupational History  . Not on file  Tobacco Use  . Smoking status: Never Smoker  . Smokeless  tobacco: Never Used  Vaping Use  . Vaping Use: Never used  Substance and Sexual Activity  . Alcohol use: No    Alcohol/week: 0.0 standard drinks  . Drug use: No  . Sexual activity: Not on file  Other Topics Concern  . Not on file  Social History Narrative  . Not on file   Social Determinants of Health   Financial Resource Strain: Not on file  Food Insecurity: Not on file  Transportation Needs: Not on file  Physical Activity: Not on file  Stress: Not on file  Social Connections: Not on file  Intimate Partner Violence: Not on file    FAMILY HISTORY: Family History  Problem Relation Age of Onset  . Macular degeneration Mother   . Dementia Mother   . Diverticulitis Mother   . Prostate cancer Paternal Grandfather   . Diverticulitis Brother   . Colon cancer Neg Hx   . Colon polyps Neg Hx   . Esophageal cancer Neg Hx   .  Liver cancer Neg Hx   . Pancreatic cancer Neg Hx   . Rectal cancer Neg Hx   . Stomach cancer Neg Hx     ALLERGIES:  is allergic to augmentin [amoxicillin-pot clavulanate].  MEDICATIONS:  Current Outpatient Medications  Medication Sig Dispense Refill  . acetaminophen (TYLENOL) 325 MG tablet Take 325 mg by mouth every 6 (six) hours as needed for moderate pain or headache.    . allopurinol (ZYLOPRIM) 100 MG tablet TAKE 1 TABLET BY MOUTH TWO TIMES DAILY 60 tablet 0  . cholecalciferol (VITAMIN D3) 25 MCG (1000 UNIT) tablet Take 1,000 Units by mouth daily.    Marland Kitchen COVID-19 At Home Antigen Test KIT USE AS DIRECTED WITHIN PACKAGE INSTRUCTIONS 4 kit 0  . dexamethasone (DECADRON) 4 MG tablet TAKE 2 TABLETS BY MOUTH DAILY START DAY AFTER CHEMO 30 tablet 0  . famotidine (PEPCID) 20 MG tablet Take 1 tablet (20 mg total) by mouth at bedtime. (Patient taking differently: Take 20 mg by mouth daily as needed for heartburn. ) 30 tablet 3  . Multiple Vitamin (MULTIVITAMIN) tablet Take 1 tablet by mouth daily.    . Omega-3 Fatty Acids (FISH OIL) 1000 MG CAPS Take 1,000 mg by  mouth daily.    . ondansetron (ZOFRAN) 8 MG tablet Take 1 tablet (8 mg total) by mouth 2 (two) times daily as needed for refractory nausea / vomiting. Start on day 2 after bendamustine chemo. 30 tablet 1  . pantoprazole (PROTONIX) 20 MG tablet Take 1 tablet  by mouth daily. 90 tablet 1  . pantoprazole (PROTONIX) 40 MG tablet Take 1 tablet (40 mg total) by mouth daily. 90 tablet 3  . prochlorperazine (COMPAZINE) 10 MG tablet Take 1 tablet (10 mg total) by mouth every 6 (six) hours as needed (Nausea or vomiting). 30 tablet 1  . vitamin C (ASCORBIC ACID) 250 MG tablet Take 250 mg by mouth daily.     No current facility-administered medications for this visit.   Facility-Administered Medications Ordered in Other Visits  Medication Dose Route Frequency Provider Last Rate Last Admin  . fludeoxyglucose F - 18 (FDG) injection 9.5 millicurie  9.5 millicurie Intravenous Once PRN Kalman Jewels, MD        REVIEW OF SYSTEMS:   10 Point review of Systems was done is negative except as noted above.  PHYSICAL EXAMINATION: ECOG PERFORMANCE STATUS: 1 - Symptomatic but completely ambulatory  Vitals:   01/14/21 1503  BP: (!) 160/105  Pulse: 82  Resp: 20  Temp: 97.7 F (36.5 C)  SpO2: 98%   Filed Weights   01/14/21 1503  Weight: 188 lb 4.8 oz (85.4 kg)   .Body mass index is 27.81 kg/m.     GENERAL:alert, in no acute distress and comfortable SKIN: no acute rashes, no significant lesions EYES: conjunctiva are pink and non-injected, sclera anicteric OROPHARYNX: MMM, no exudates, no oropharyngeal erythema or ulceration NECK: supple, no JVD LYMPH:  no palpable lymphadenopathy in the cervical, axillary or inguinal regions LUNGS: clear to auscultation b/l with normal respiratory effort HEART: regular rate & rhythm ABDOMEN:  normoactive bowel sounds , non tender, not distended. Extremity: no pedal edema PSYCH: alert & oriented x 3 with fluent speech NEURO: no focal motor/sensory  deficits  LABORATORY DATA:  I have reviewed the data as listed  . CBC Latest Ref Rng & Units 11/20/2020 10/23/2020 09/25/2020  WBC 4.0 - 10.5 K/uL 5.8 5.5 2.4(L)  Hemoglobin 13.0 - 17.0 g/dL 13.2 14.1 13.7  Hematocrit 39.0 - 52.0 %  38.4(L) 40.4 39.2  Platelets 150 - 400 K/uL 217 221 184   ANC 800 . CMP Latest Ref Rng & Units 11/20/2020 10/23/2020 09/25/2020  Glucose 70 - 99 mg/dL 90 85 77  BUN 8 - 23 mg/dL _0 Creatinine 0.61 - 1.24 mg/dL 1.05 1.10 1.12  Sodium 135 - 145 mmol/L 141 143 143  Potassium 3.5 - 5.1 mmol/L 4.1 4.0 4.1  Chloride 98 - 111 mmol/L 107 107 108  CO2 22 - 32 mmol/L _1 Calcium 8.9 - 10.3 mg/dL 8.9 9.2 9.2  Total Protein 6.5 - 8.1 g/dL 6.5 6.9 6.9  Total Bilirubin 0.3 - 1.2 mg/dL 0.5 0.5 0.5  Alkaline Phos 38 - 126 U/L 75 101 62  AST 15 - 41 U/L 40 27 36  ALT 0 - 44 U/L _2 06/20/2020 Right Axillary Surgical Pathology Report [id number]):    06/20/2020 Right Axillary Lymph Node Flow Pathology ([id number]):   06/11/2020 Flow Pathology ([id number]):   RADIOGRAPHIC STUDIES: I have personally reviewed the radiological images as listed and agreed with the findings in the report. NM PET Image Restag (PS) Skull Base To Thigh  Result Date: 01/13/2021 CLINICAL DATA:  Subsequent treatment strategy for mantle cell lymphoma. EXAM: NUCLEAR MEDICINE PET SKULL BASE TO THIGH TECHNIQUE: 9.2 mCi F-18 FDG was injected intravenously. Full-ring PET imaging was performed from the skull base to thigh after the radiotracer. CT data was obtained and used for attenuation correction and anatomic localization. Fasting blood glucose: 99 mg/dl COMPARISON:  09/23/2020. FINDINGS: Mediastinal blood pool activity: SUV max 2.6 Liver activity: SUV max 3.6 NECK: No abnormal hypermetabolism. Incidental CT findings: None. CHEST: No hypermetabolic mediastinal, hilar or axillary lymph nodes. No hypermetabolic pulmonary nodules. Incidental CT findings: Heart is enlarged.  No  pericardial or pleural effusion. ABDOMEN/PELVIS: No abnormal hypermetabolism in the liver, adrenal glands, spleen or pancreas. Spleen is hypometabolic with respect to the liver. No hypermetabolic lymph nodes. Incidental CT findings: Liver, gallbladder, adrenal glands, kidneys, spleen, pancreas, stomach and bowel are grossly unremarkable. Atherosclerotic calcification of the aorta. Trace pelvic free fluid. Bladder wall appears thickened but the bladder is under distended. SKELETON: No abnormal hypermetabolism. Incidental CT findings: Bone island in the left iliac wing. Mild degenerative changes in the spine. IMPRESSION: 1. No evidence of recurrent lymphoma. 2.  Aortic atherosclerosis (ICD10-I70.0). Electronically Signed   By: Lorin Picket M.D.   On: 01/13/2021 11:30    ASSESSMENT & PLAN:   65 yo with   1) Recently diagnosed stage IV mantle cell lymphoma with extensive lymphadenopathy and massive splenomegaly . 2) s/p massive splenomegaly likely related to mantle cell lymphoma  3) thrombocytopenia-mild platelets of 132k likely related to lymphoma. 4) hepatitis B core antibody positive, surface antigen negative, HBV DNA PCR negative 5) elevated LDH due to lymphoma  PLAN: -Discussed pt labwork today,01/14/2021; blood counts and chemistries all completely normal. LDH stable. -Discussed pt PET Skull Base to Thigh ([id number]) on 01/13/2021; no residual lymphoma. The pt is in remission. -Advised pt that LDH can be high due to muscle activity, liver, or recent treatment. This is not correlating with active lymphoma and is not a concern at this time. -Discussed next decision-- starting maintenance regiment of treatment 2-3 months post-last treatment. Discussed potential transplant, but the pt does not wish to pursue this option still due to fact it is not curative and invasive burden of care. -Recommended maintenance treatment of Rituxan.  -Discussed Evus[place] and pt's eligibility. Will send referral.  Advised pt to wait around one month after this before getting the second booster shot. -Recommended pt receive the second COVID booster shot as recently approved. Advised pt to wait 4-6 months following first booster shot before getting this. -Advised pt to continue to f/u w dermatologist q43month for routine screenings. -Advised pt to be as physically active as desired. Practice caution if desiring to workout in indoor gym and take precautions needed for infections. -Recommended sun protection due to increased skin sensitivity while outside. -Will see back at the beginning of June for starting maintenance treatment.  FOLLOW UP: Plz schedule to start maintenance Rituxan on 02/20/2021 with labs and MD visit  The total time spent in the appointment was 30 minutes and more than 50% was on counseling and direct patient cares.    All of the patient's questions were answered with apparent satisfaction. The patient knows to call the clinic with any problems, questions or concerns.    GSullivan LoneMD MNorwoodAAHIVMS SKaiser Fnd Hosp - San RafaelCGrand River Endoscopy Center LLCHematology/Oncology Physician CValley [facility] (Office):       [phone](Work cell):  [phone](Fax):           [phone] 01/13/2021 7:07 PM  I, RReinaldo Raddle am acting as scribe for Dr. GSullivan Lone MD.   .I have reviewed the above documentation for accuracy and completeness, and I agree with the above. .Brunetta GeneraMD

## 2021-01-13 NOTE — Telephone Encounter (Signed)
Contacted pt regarding my Chart message. Pt states he was exposed to Covid over the weekend. Pt took PCR test today. Pt told to monitor for symptoms and let us know if he is positive or develops any symptoms prior to his next appointment. Pt verbalized understanding.

## 2021-01-14 ENCOUNTER — Other Ambulatory Visit: Payer: Self-pay

## 2021-01-14 ENCOUNTER — Inpatient Hospital Stay: Payer: 59 | Attending: Hematology

## 2021-01-14 ENCOUNTER — Inpatient Hospital Stay (HOSPITAL_BASED_OUTPATIENT_CLINIC_OR_DEPARTMENT_OTHER): Payer: 59 | Admitting: Hematology

## 2021-01-14 VITALS — BP 160/105 | HR 82 | Temp 97.7°F | Resp 20 | Wt 188.3 lb

## 2021-01-14 DIAGNOSIS — D2260 Melanocytic nevi of unspecified upper limb, including shoulder: Secondary | ICD-10-CM | POA: Diagnosis not present

## 2021-01-14 DIAGNOSIS — D696 Thrombocytopenia, unspecified: Secondary | ICD-10-CM | POA: Diagnosis not present

## 2021-01-14 DIAGNOSIS — C8314 Mantle cell lymphoma, lymph nodes of axilla and upper limb: Secondary | ICD-10-CM | POA: Diagnosis not present

## 2021-01-14 DIAGNOSIS — Z8042 Family history of malignant neoplasm of prostate: Secondary | ICD-10-CM | POA: Insufficient documentation

## 2021-01-14 DIAGNOSIS — R7402 Elevation of levels of lactic acid dehydrogenase (LDH): Secondary | ICD-10-CM | POA: Insufficient documentation

## 2021-01-14 DIAGNOSIS — U071 COVID-19: Secondary | ICD-10-CM | POA: Diagnosis not present

## 2021-01-14 DIAGNOSIS — Z7189 Other specified counseling: Secondary | ICD-10-CM

## 2021-01-14 DIAGNOSIS — Z5111 Encounter for antineoplastic chemotherapy: Secondary | ICD-10-CM

## 2021-01-14 DIAGNOSIS — C8318 Mantle cell lymphoma, lymph nodes of multiple sites: Secondary | ICD-10-CM

## 2021-01-14 LAB — COMPREHENSIVE METABOLIC PANEL
ALT: 25 U/L (ref 0–44)
AST: 29 U/L (ref 15–41)
Albumin: 3.9 g/dL (ref 3.5–5.0)
Alkaline Phosphatase: 68 U/L (ref 38–126)
Anion gap: 10 (ref 5–15)
BUN: 18 mg/dL (ref 8–23)
CO2: 27 mmol/L (ref 22–32)
Calcium: 9.3 mg/dL (ref 8.9–10.3)
Chloride: 107 mmol/L (ref 98–111)
Creatinine, Ser: 1.06 mg/dL (ref 0.61–1.24)
GFR, Estimated: >60 mL/min (ref >60)
Glucose, Bld: 94 mg/dL (ref 70–99)
Potassium: 4.1 mmol/L (ref 3.5–5.1)
Sodium: 144 mmol/L (ref 135–145)
Total Bilirubin: 0.4 mg/dL (ref 0.3–1.2)
Total Protein: 6.8 g/dL (ref 6.5–8.1)

## 2021-01-14 LAB — CBC WITH DIFFERENTIAL/PLATELET
Abs Immature Granulocytes: 0.01 10*3/uL (ref 0.00–0.07)
Basophils Absolute: 0 10*3/uL (ref 0.0–0.1)
Basophils Relative: 1 %
Eosinophils Absolute: 0 10*3/uL (ref 0.0–0.5)
Eosinophils Relative: 1 %
HCT: 39.9 % (ref 39.0–52.0)
Hemoglobin: 13.9 g/dL (ref 13.0–17.0)
Immature Granulocytes: 0 %
Lymphocytes Relative: 28 %
Lymphs Abs: 1.2 10*3/uL (ref 0.7–4.0)
MCH: 31.2 pg (ref 26.0–34.0)
MCHC: 34.8 g/dL (ref 30.0–36.0)
MCV: 89.7 fL (ref 80.0–100.0)
Monocytes Absolute: 0.4 10*3/uL (ref 0.1–1.0)
Monocytes Relative: 10 %
Neutro Abs: 2.6 10*3/uL (ref 1.7–7.7)
Neutrophils Relative %: 60 %
Platelets: 169 10*3/uL (ref 150–400)
RBC: 4.45 MIL/uL (ref 4.22–5.81)
RDW: 12.4 % (ref 11.5–15.5)
WBC: 4.3 10*3/uL (ref 4.0–10.5)
nRBC: 0 % (ref 0.0–0.2)

## 2021-01-14 LAB — LACTATE DEHYDROGENASE: LDH: 230 U/L — ABNORMAL HIGH (ref 98–192)

## 2021-01-15 ENCOUNTER — Other Ambulatory Visit: Payer: 59

## 2021-01-16 ENCOUNTER — Encounter: Payer: Self-pay | Admitting: Hematology

## 2021-01-16 ENCOUNTER — Other Ambulatory Visit: Payer: Self-pay | Admitting: *Deleted

## 2021-01-16 ENCOUNTER — Other Ambulatory Visit: Payer: 59

## 2021-01-16 ENCOUNTER — Ambulatory Visit: Payer: 59 | Admitting: Hematology

## 2021-01-16 ENCOUNTER — Telehealth: Payer: Self-pay | Admitting: *Deleted

## 2021-01-16 NOTE — Telephone Encounter (Signed)
Contacted patient with following information:  Dr. Irene Limbo informed of patient's positive Covid test. MD ordered "Referral for Covid treatment". Advised patient that he will be contacted for evaluation. Patient verbalized understanding.

## 2021-01-16 NOTE — Progress Notes (Signed)
Opened in error

## 2021-01-17 ENCOUNTER — Ambulatory Visit (INDEPENDENT_AMBULATORY_CARE_PROVIDER_SITE_OTHER): Payer: 59

## 2021-01-17 ENCOUNTER — Other Ambulatory Visit: Payer: Self-pay

## 2021-01-17 ENCOUNTER — Other Ambulatory Visit: Payer: Self-pay | Admitting: Physician Assistant

## 2021-01-17 DIAGNOSIS — C8318 Mantle cell lymphoma, lymph nodes of multiple sites: Secondary | ICD-10-CM

## 2021-01-17 DIAGNOSIS — U071 COVID-19: Secondary | ICD-10-CM

## 2021-01-17 MED ORDER — BEBTELOVIMAB 175 MG/2 ML IV (EUA)
175.0000 mg | Freq: Once | INTRAMUSCULAR | Status: AC
Start: 2021-01-17 — End: 2021-01-17
  Administered 2021-01-17: 175 mg via INTRAVENOUS

## 2021-01-17 MED ORDER — METHYLPREDNISOLONE SODIUM SUCC 125 MG IJ SOLR: 125.0000 mg | Freq: Once | INTRAMUSCULAR | Status: AC | PRN

## 2021-01-17 MED ORDER — FAMOTIDINE IN NACL 20-0.9 MG/50ML-% IV SOLN: 20.0000 mg | Freq: Once | INTRAVENOUS | Status: AC | PRN

## 2021-01-17 MED ORDER — DIPHENHYDRAMINE HCL 50 MG/ML IJ SOLN: 50.0000 mg | Freq: Once | INTRAMUSCULAR | Status: AC | PRN

## 2021-01-17 MED ORDER — SODIUM CHLORIDE 0.9 % IV SOLN: INTRAVENOUS | Status: AC | PRN

## 2021-01-17 MED ORDER — ALBUTEROL SULFATE HFA 108 (90 BASE) MCG/ACT IN AERS: 2.0000 | INHALATION_SPRAY | Freq: Once | RESPIRATORY_TRACT | Status: AC | PRN

## 2021-01-17 MED ORDER — EPINEPHRINE 0.3 MG/0.3ML IJ SOAJ: 0.3000 mg | Freq: Once | INTRAMUSCULAR | Status: AC | PRN

## 2021-01-17 NOTE — Progress Notes (Signed)
I connected by phone with Brian Kaiser on 01/17/2021 at 10:13 AM to discuss the potential use of a new treatment for mild to moderate COVID-19 viral infection in non-hospitalized patients.  This patient is a 65 y.o. male that meets the FDA criteria for Emergency Use Authorization of COVID monoclonal antibody bebtelovimab.  Has a (+) direct SARS-CoV-2 viral test result  Has mild or moderate COVID-19   Is NOT hospitalized due to COVID-19  Is within 10 days of symptom onset  Has at least one of the high risk factor(s) for progression to severe COVID-19 and/or hospitalization as defined in EUA.  Specific high risk criteria : BMI > 25 and Immunosuppressive Disease or Treatment   I have spoken and communicated the following to the patient or parent/caregiver regarding COVID monoclonal antibody treatment:  1. FDA has authorized the emergency use for the treatment of mild to moderate COVID-19 in adults and pediatric patients with positive results of direct SARS-CoV-2 viral testing who are 47 years of age and older weighing at least 40 kg, and who are at high risk for progressing to severe COVID-19 and/or hospitalization.  2. The significant known and potential risks and benefits of COVID monoclonal antibody, and the extent to which such potential risks and benefits are unknown.  3. Information on available alternative treatments and the risks and benefits of those alternatives, including clinical trials.  4. Patients treated with COVID monoclonal antibody should continue to self-isolate and use infection control measures (e.g., wear mask, isolate, social distance, avoid sharing personal items, clean and disinfect "high touch" surfaces, and frequent handwashing) according to CDC guidelines.   5. The patient or parent/caregiver has the option to accept or refuse COVID monoclonal antibody treatment.  6. Discussion about the monoclonal antibody infusion does not ensure treatment. The patient will be  placed on a list and scheduled according to risk, symptom onset and availability. A scheduler will reach to the patient to let them know if we can accommodate their infusion or not.  After reviewing this information with the patient, the patient has agreed to receive one of the available covid 19 monoclonal antibodies and will be provided an appropriate fact sheet prior to infusion.    Angelena Form, PA-C 01/17/2021 10:13 AM

## 2021-01-17 NOTE — Addendum Note (Signed)
Addended by: Eileen Stanford on: 01/17/2021 12:35 PM   Modules accepted: Orders, SmartSet

## 2021-01-17 NOTE — Patient Instructions (Signed)

## 2021-01-17 NOTE — Progress Notes (Signed)
Diagnosis: Covid  Provider:  Marshell Garfinkel, MD  Procedure: Infusion  IV Type: Peripheral, IV Location: L Hand  Bebtelovimab, Dose: 175mg   Infusion Start Time: 1410  Infusion Stop Time: 1411  Post Infusion IV Care: Observation period completed and Peripheral IV Discontinued  Discharge: Condition: Good, Destination: Home . AVS provided to patient.   Performed by:  Arnoldo Morale, RN

## 2021-01-31 ENCOUNTER — Encounter: Payer: Self-pay | Admitting: Hematology

## 2021-01-31 ENCOUNTER — Other Ambulatory Visit: Payer: Self-pay | Admitting: Hematology

## 2021-02-05 ENCOUNTER — Telehealth: Payer: Self-pay | Admitting: Hematology

## 2021-02-05 NOTE — Telephone Encounter (Signed)
Scheduled appts per 5/13 sch msg. Pt is aware. Pt requested lab day before infusion.

## 2021-02-20 ENCOUNTER — Inpatient Hospital Stay: Payer: 59 | Attending: Hematology

## 2021-02-20 ENCOUNTER — Other Ambulatory Visit: Payer: Self-pay

## 2021-02-20 DIAGNOSIS — Z79899 Other long term (current) drug therapy: Secondary | ICD-10-CM | POA: Insufficient documentation

## 2021-02-20 DIAGNOSIS — Z5112 Encounter for antineoplastic immunotherapy: Secondary | ICD-10-CM | POA: Diagnosis not present

## 2021-02-20 DIAGNOSIS — Z5111 Encounter for antineoplastic chemotherapy: Secondary | ICD-10-CM

## 2021-02-20 DIAGNOSIS — C8318 Mantle cell lymphoma, lymph nodes of multiple sites: Secondary | ICD-10-CM

## 2021-02-20 DIAGNOSIS — C8314 Mantle cell lymphoma, lymph nodes of axilla and upper limb: Secondary | ICD-10-CM | POA: Insufficient documentation

## 2021-02-20 LAB — CBC WITH DIFFERENTIAL/PLATELET
Abs Immature Granulocytes: 0.03 10*3/uL (ref 0.00–0.07)
Basophils Absolute: 0 10*3/uL (ref 0.0–0.1)
Basophils Relative: 1 %
Eosinophils Absolute: 0 10*3/uL (ref 0.0–0.5)
Eosinophils Relative: 1 %
HCT: 41.8 % (ref 39.0–52.0)
Hemoglobin: 14.5 g/dL (ref 13.0–17.0)
Immature Granulocytes: 1 %
Lymphocytes Relative: 32 %
Lymphs Abs: 1.2 10*3/uL (ref 0.7–4.0)
MCH: 30.9 pg (ref 26.0–34.0)
MCHC: 34.7 g/dL (ref 30.0–36.0)
MCV: 89.1 fL (ref 80.0–100.0)
Monocytes Absolute: 0.5 10*3/uL (ref 0.1–1.0)
Monocytes Relative: 14 %
Neutro Abs: 1.9 10*3/uL (ref 1.7–7.7)
Neutrophils Relative %: 51 %
Platelets: 184 10*3/uL (ref 150–400)
RBC: 4.69 MIL/uL (ref 4.22–5.81)
RDW: 12.4 % (ref 11.5–15.5)
WBC: 3.7 10*3/uL — ABNORMAL LOW (ref 4.0–10.5)
nRBC: 0 % (ref 0.0–0.2)

## 2021-02-20 LAB — CMP (CANCER CENTER ONLY)
ALT: 24 U/L (ref 0–44)
AST: 35 U/L (ref 15–41)
Albumin: 4 g/dL (ref 3.5–5.0)
Alkaline Phosphatase: 74 U/L (ref 38–126)
Anion gap: 11 (ref 5–15)
BUN: 17 mg/dL (ref 8–23)
CO2: 25 mmol/L (ref 22–32)
Calcium: 9.5 mg/dL (ref 8.9–10.3)
Chloride: 105 mmol/L (ref 98–111)
Creatinine: 1.25 mg/dL — ABNORMAL HIGH (ref 0.61–1.24)
GFR, Estimated: >60 mL/min (ref >60)
Glucose, Bld: 106 mg/dL — ABNORMAL HIGH (ref 70–99)
Potassium: 4.2 mmol/L (ref 3.5–5.1)
Sodium: 141 mmol/L (ref 135–145)
Total Bilirubin: 0.5 mg/dL (ref 0.3–1.2)
Total Protein: 7.3 g/dL (ref 6.5–8.1)

## 2021-02-20 NOTE — Progress Notes (Signed)
HEMATOLOGY/ONCOLOGY CLINIC NOTE  Date of Service: 02/21/2021  Patient Care Team: [name], MD as PCP - General (Family Medicine)  CHIEF COMPLAINTS/PURPOSE OF CONSULTATION:  Continue mx of mantle cell lymphoma  HISTORY OF PRESENTING ILLNESS:   Brian Kaiser is a wonderful 65 y.o. male who has been referred to [place] by Dr. [name] for evaluation and management of concern for lymphoma. Pt is accompanied today by his wife, [name]. The pt reports that he is doing well overall.    The pt reports that last November he had an Upper Endoscopy because he was having abdominal bloating. Pt was found to have gastritis and was given Protonix and Pepcid to use as needed. Pt has not used Protonix and Pepcid in two months. His abdominal bloating has been intermittent since November, but has been better recently.   Last July pt had surgery on his right forearm and was experiencing difficulty sleeping due to him continuously rolling on the arm. This lasted for some time. Pt had his second COVID19 vaccine in February and his second Shingles vaccine two weeks later. After this he began experiencing fatigue, which is still present. His fatigue is not progressive and he is able to complete his daily task. Pt has drenching night sweats that started two months ago. These do not occur every night. He has lost 15 lbs in the last few months without effort, but started back going to the gym in May. Pt has noticed that left inguinal lymph node has fluctuated in size since he was diagnosed with Hepatitis B nearly 20 years ago. Recently the lymph node has grown in size and become more firm. He has also noticed an increase in the size of a right cervical lymph node. Pt has had left testicular discomfort in the last few weeks. He reports b/l ankle swelling, calf cramping and upper left abdominal pain with certain sleeping positions.  His Hepatitis B was picked up after he was experiencing abdominal discomfort and was  treated with milk thistle. It has been monitored via labwork over the years. Pt had a Basal Cell lesion on his left thigh and back a few months ago. They have been removed.   Of note prior to the patient's visit today, pt has had CT C/A/P ([id number]) completed on 06/06/2020 with results revealing "Extensive adenopathy in the chest, abdomen and pelvis. Marked splenomegaly. Findings most concerning for lymphoma. Small amount of free fluid in the abdomen and pelvis."   Most recent lab results (06/03/2020) of CBC w/diff & CMP is as follows: all values are WNL except for PLT at 135K. 06/03/2020 TSH at 7.86 06/03/2020 PSA at 0.20 06/03/2020 Sed Rate at 1  On review of systems, pt reports night sweats, new lumps/bumps, ankle swelling, calf cramping, constipation, change in color of bowel movements, left testicular pain, fatigue, unexpected weight loss, improving abdominal bloating, early satiety and denies SOB, chest pain, fevers, chills, bloody/black stools, headaches, vision changes, bone pain, back pain, flank pain and any other symptoms.   On PMHx the pt reports GERD, Forearm laceration, Right Forearm Ligament Repair, Hepatitis B, Hyperlipidemia. On Family Hx the pt reports that his brother was diagnosed with DLBCL a year ago.  INTERVAL HISTORY:  Brian Kaiser is a wonderful 65 y.o. male who is here for evaluation and management of his Mantle Cell Lymphoma. The patient's last visit with [place] was on 01/14/2021. He is accompanied here today by his wife. The pt reports that he is doing well overall.  The pt reports no new symptoms or concerns. He notes no new medication changes. He has been recovering since his COVID infection. He notes he will be having surgery for the atypical melanoma on his back in the next week.   Lab results 02/20/2021 of CBC w/diff and CMP is as follows: all values are WNL except for WBC of 3.7K, Glucose of 106, Creatinine of 1.25.  On review of systems, pt denies difficulty  passing urine, urinary retention, abdominal pain, sudden weight loss, leg swelling, and any other symptoms.  MEDICAL HISTORY:  Past Medical History:  Diagnosis Date  . Allergy   . Forearm laceration    right forearm  . GERD (gastroesophageal reflux disease)   . Hepatitis B   . Hyperlipidemia   . Pulmonary lesion    Left pulmonary schwannoma  . Schwannoma    left lower lung    SURGICAL HISTORY: Past Surgical History:  Procedure Laterality Date  . COLONOSCOPY    . LIGAMENT REPAIR Right 04/19/2019   Right forearm  . LIPOMA EXCISION Left    neck  . LUNG SURGERY Left   . OTHER SURGICAL HISTORY Left    pulmonary schwannoma excision  . UPPER GASTROINTESTINAL ENDOSCOPY    . WOUND EXPLORATION Right 04/19/2019   Procedure: WOUND EXPLORATION; REPAIR RIGHT FOREARM LACERATION;  Surgeon: Charlotte Crumb, MD;  Location: [facility];  Service: Orthopedics;  Laterality: Right;  AXILLARY BLOCK AND MAC    SOCIAL HISTORY: Social History   Socioeconomic History  . Marital status: Married    Spouse name: Not on file  . Number of children: Not on file  . Years of education: Not on file  . Highest education level: Not on file  Occupational History  . Not on file  Tobacco Use  . Smoking status: Never Smoker  . Smokeless tobacco: Never Used  Vaping Use  . Vaping Use: Never used  Substance and Sexual Activity  . Alcohol use: No    Alcohol/week: 0.0 standard drinks  . Drug use: No  . Sexual activity: Not on file  Other Topics Concern  . Not on file  Social History Narrative  . Not on file   Social Determinants of Health   Financial Resource Strain: Not on file  Food Insecurity: Not on file  Transportation Needs: Not on file  Physical Activity: Not on file  Stress: Not on file  Social Connections: Not on file  Intimate Partner Violence: Not on file    FAMILY HISTORY: Family History  Problem Relation Age of Onset  . Macular degeneration Mother   . Dementia  Mother   . Diverticulitis Mother   . Prostate cancer Paternal Grandfather   . Diverticulitis Brother   . Colon cancer Neg Hx   . Colon polyps Neg Hx   . Esophageal cancer Neg Hx   . Liver cancer Neg Hx   . Pancreatic cancer Neg Hx   . Rectal cancer Neg Hx   . Stomach cancer Neg Hx     ALLERGIES:  is allergic to augmentin [amoxicillin-pot clavulanate].  MEDICATIONS:  Current Outpatient Medications  Medication Sig Dispense Refill  . acetaminophen (TYLENOL) 325 MG tablet Take 325 mg by mouth every 6 (six) hours as needed for moderate pain or headache.    . allopurinol (ZYLOPRIM) 100 MG tablet TAKE 1 TABLET BY MOUTH TWO TIMES DAILY 60 tablet 0  . cholecalciferol (VITAMIN D3) 25 MCG (1000 UNIT) tablet Take 1,000 Units by mouth daily.    Marland Kitchen  COVID-19 At Home Antigen Test KIT USE AS DIRECTED WITHIN PACKAGE INSTRUCTIONS 4 kit 0  . dexamethasone (DECADRON) 4 MG tablet TAKE 2 TABLETS BY MOUTH DAILY START DAY AFTER CHEMO 30 tablet 0  . famotidine (PEPCID) 20 MG tablet Take 1 tablet (20 mg total) by mouth at bedtime. (Patient taking differently: Take 20 mg by mouth daily as needed for heartburn. ) 30 tablet 3  . Multiple Vitamin (MULTIVITAMIN) tablet Take 1 tablet by mouth daily.    . Omega-3 Fatty Acids (FISH OIL) 1000 MG CAPS Take 1,000 mg by mouth daily.    . pantoprazole (PROTONIX) 20 MG tablet Take 1 tablet  by mouth daily. 90 tablet 1  . pantoprazole (PROTONIX) 40 MG tablet Take 1 tablet (40 mg total) by mouth daily. 90 tablet 3  . vitamin C (ASCORBIC ACID) 250 MG tablet Take 250 mg by mouth daily.     Current Facility-Administered Medications  Medication Dose Route Frequency Provider Last Rate Last Admin  . 0.9 %  sodium chloride infusion   Intravenous PRN Eileen Stanford, PA-C        REVIEW OF SYSTEMS:   10 Point review of Systems was done is negative except as noted above.  PHYSICAL EXAMINATION: ECOG PERFORMANCE STATUS: 1 - Symptomatic but completely ambulatory  There were no  vitals filed for this visit. There were no vitals filed for this visit. .There is no height or weight on file to calculate BMI.   Exam was given in infusion.  GENERAL:alert, in no acute distress and comfortable SKIN: no acute rashes, no significant lesions EYES: conjunctiva are pink and non-injected, sclera anicteric OROPHARYNX: MMM, no exudates, no oropharyngeal erythema or ulceration NECK: supple, no JVD LYMPH:  no palpable lymphadenopathy in the cervical, axillary or inguinal regions LUNGS: clear to auscultation b/l with normal respiratory effort HEART: regular rate & rhythm ABDOMEN:  normoactive bowel sounds , non tender, not distended. Extremity: no pedal edema PSYCH: alert & oriented x 3 with fluent speech NEURO: no focal motor/sensory deficits  LABORATORY DATA:  I have reviewed the data as listed  . CBC Latest Ref Rng & Units 02/20/2021 01/14/2021 11/20/2020  WBC 4.0 - 10.5 K/uL 3.7(L) 4.3 5.8  Hemoglobin 13.0 - 17.0 g/dL 14.5 13.9 13.2  Hematocrit 39.0 - 52.0 % 41.8 39.9 38.4(L)  Platelets 150 - 400 K/uL 184 169 217   ANC 800 . CMP Latest Ref Rng & Units 02/20/2021 01/14/2021 11/20/2020  Glucose 70 - 99 mg/dL 106(H) 94 90  BUN 8 - 23 mg/dL _0 Creatinine 0.61 - 1.24 mg/dL 1.25(H) 1.06 1.05  Sodium 135 - 145 mmol/L 141 144 141  Potassium 3.5 - 5.1 mmol/L 4.2 4.1 4.1  Chloride 98 - 111 mmol/L 105 107 107  CO2 22 - 32 mmol/L _1 Calcium 8.9 - 10.3 mg/dL 9.5 9.3 8.9  Total Protein 6.5 - 8.1 g/dL 7.3 6.8 6.5  Total Bilirubin 0.3 - 1.2 mg/dL 0.5 0.4 0.5  Alkaline Phos 38 - 126 U/L 74 68 75  AST 15 - 41 U/L 35 29 40  ALT 0 - 44 U/L _2 06/20/2020 Right Axillary Surgical Pathology Report [id number]):    06/20/2020 Right Axillary Lymph Node Flow Pathology ([id number]):   06/11/2020 Flow Pathology ([id number]):   RADIOGRAPHIC STUDIES: I have personally reviewed the radiological images as listed and agreed with the findings in the report. No  results found.  ASSESSMENT & PLAN:   65 yo with  1) Recently diagnosed stage IV mantle cell lymphoma with extensive lymphadenopathy and massive splenomegaly . 2) s/p massive splenomegaly likely related to mantle cell lymphoma  3) thrombocytopenia-mild platelets of 132k likely related to lymphoma. 4) hepatitis B core antibody positive, surface antigen negative, HBV DNA PCR negative 5) elevated LDH due to lymphoma  PLAN: -Discussed pt labwork, 02/20/2021; counts and chemistries stable. -Recommended pt receive the second COVID booster shot as recently approved. Advised pt to wait 4-6 months following first booster shot before getting this. -Advised pt to wait 2-3 months prior to getting next booster from getting the antibodies he received. -Recommended pt continue to stay active, eat healthy, and get ample sleep. -Advised pt to continue to f/u w dermatologist q25month for routine screenings. -Recommended sun protection due to increased skin sensitivity while outside. -Continue maintenance Rituxan q227month The pt has no prohibitive toxicities at this time. Will continue to monitor. -Will see back in 2 months with labs.   FOLLOW UP: Plz schedule for next 2 doses of maintenance Rituxan q60 days with labs and MD visits  The total time spent in the appointment was 20 minutes and more than 50% was on counseling and direct patient cares.   All of the patient's questions were answered with apparent satisfaction. The patient knows to call the clinic with any problems, questions or concerns.    GaSullivan LoneD MSTonaleaAHIVMS SCKaiser Permanente P.H.F - Santa ClaraTOthello Community Hospitalematology/Oncology Physician CoHughes [facility](Office):       [phone] cell):  [phone]):           [phone]/11/2020 2:28 PM  I, RoReinaldo Raddleam acting as scribe for Dr. GaSullivan LoneMD.  .I have reviewed the above documentation for accuracy and completeness, and I agree with the above. .GBrunetta GeneraD

## 2021-02-21 ENCOUNTER — Ambulatory Visit: Payer: 59

## 2021-02-21 ENCOUNTER — Other Ambulatory Visit: Payer: 59

## 2021-02-21 ENCOUNTER — Inpatient Hospital Stay (HOSPITAL_BASED_OUTPATIENT_CLINIC_OR_DEPARTMENT_OTHER): Payer: 59 | Admitting: Hematology

## 2021-02-21 ENCOUNTER — Inpatient Hospital Stay: Payer: 59

## 2021-02-21 VITALS — BP 142/89 | HR 61 | Temp 98.2°F | Resp 17 | Wt 185.2 lb

## 2021-02-21 DIAGNOSIS — Z5112 Encounter for antineoplastic immunotherapy: Secondary | ICD-10-CM

## 2021-02-21 DIAGNOSIS — R161 Splenomegaly, not elsewhere classified: Secondary | ICD-10-CM

## 2021-02-21 DIAGNOSIS — D696 Thrombocytopenia, unspecified: Secondary | ICD-10-CM

## 2021-02-21 DIAGNOSIS — C8318 Mantle cell lymphoma, lymph nodes of multiple sites: Secondary | ICD-10-CM

## 2021-02-21 DIAGNOSIS — C8314 Mantle cell lymphoma, lymph nodes of axilla and upper limb: Secondary | ICD-10-CM | POA: Diagnosis not present

## 2021-02-21 DIAGNOSIS — Z8042 Family history of malignant neoplasm of prostate: Secondary | ICD-10-CM

## 2021-02-21 DIAGNOSIS — Z7189 Other specified counseling: Secondary | ICD-10-CM

## 2021-02-21 DIAGNOSIS — R7402 Elevation of levels of lactic acid dehydrogenase (LDH): Secondary | ICD-10-CM | POA: Diagnosis not present

## 2021-02-21 DIAGNOSIS — Z79899 Other long term (current) drug therapy: Secondary | ICD-10-CM | POA: Diagnosis not present

## 2021-02-21 MED ORDER — SODIUM CHLORIDE 0.9 % IV SOLN
375.0000 mg/m2 | Freq: Once | INTRAVENOUS | Status: AC
  Administered 2021-02-21: 800 mg via INTRAVENOUS
  Filled 2021-02-21: qty 30

## 2021-02-21 MED ORDER — DIPHENHYDRAMINE HCL 25 MG PO CAPS
ORAL_CAPSULE | ORAL | Status: AC
  Filled 2021-02-21: qty 2

## 2021-02-21 MED ORDER — DIPHENHYDRAMINE HCL 25 MG PO CAPS
50.0000 mg | ORAL_CAPSULE | Freq: Once | ORAL | Status: AC
  Administered 2021-02-21: 50 mg via ORAL

## 2021-02-21 MED ORDER — METHYLPREDNISOLONE SODIUM SUCC 125 MG IJ SOLR
INTRAMUSCULAR | Status: AC
  Filled 2021-02-21: qty 2

## 2021-02-21 MED ORDER — METHYLPREDNISOLONE SODIUM SUCC 125 MG IJ SOLR
125.0000 mg | Freq: Once | INTRAMUSCULAR | Status: AC
  Administered 2021-02-21: 125 mg via INTRAVENOUS

## 2021-02-21 MED ORDER — SODIUM CHLORIDE 0.9 % IV SOLN
Freq: Once | INTRAVENOUS | Status: AC
Start: 2021-02-21 — End: 2021-02-21
  Filled 2021-02-21: qty 250

## 2021-02-21 MED ORDER — FAMOTIDINE 20 MG IN NS 100 ML IVPB
20.0000 mg | Freq: Once | INTRAVENOUS | Status: AC
  Administered 2021-02-21: 20 mg via INTRAVENOUS

## 2021-02-21 MED ORDER — ACETAMINOPHEN 325 MG PO TABS
650.0000 mg | ORAL_TABLET | Freq: Once | ORAL | Status: AC
  Administered 2021-02-21: 650 mg via ORAL

## 2021-02-21 MED ORDER — ACETAMINOPHEN 325 MG PO TABS
ORAL_TABLET | ORAL | Status: AC
  Filled 2021-02-21: qty 2

## 2021-02-21 MED ORDER — FAMOTIDINE 20 MG IN NS 100 ML IVPB
INTRAVENOUS | Status: AC
  Filled 2021-02-21: qty 100

## 2021-02-21 NOTE — Patient Instructions (Signed)
[facility]  Discharge Instructions: Thank you for choosing [facility] to provide your oncology and hematology care.   If you have a lab appointment with the [facility], please go directly to the [facility] and check in at the registration area.   Wear comfortable clothing and clothing appropriate for easy access to any Portacath or PICC line.   We strive to give you quality time with your provider. You may need to reschedule your appointment if you arrive late (15 or more minutes).  Arriving late affects you and other patients whose appointments are after yours.  Also, if you miss three or more appointments without notifying the office, you may be dismissed from the clinic at the provider's discretion.      For prescription refill requests, have your pharmacy contact our office and allow 72 hours for refills to be completed.    Today you received the following chemotherapy and/or immunotherapy agents Rituxan    To help prevent nausea and vomiting after your treatment, we encourage you to take your nausea medication as directed.  BELOW ARE SYMPTOMS THAT SHOULD BE REPORTED IMMEDIATELY: . *FEVER GREATER THAN 100.4 F (38 C) OR HIGHER . *CHILLS OR SWEATING . *NAUSEA AND VOMITING THAT IS NOT CONTROLLED WITH YOUR NAUSEA MEDICATION . *UNUSUAL SHORTNESS OF BREATH . *UNUSUAL BRUISING OR BLEEDING . *URINARY PROBLEMS (pain or burning when urinating, or frequent urination) . *BOWEL PROBLEMS (unusual diarrhea, constipation, pain near the anus) . TENDERNESS IN MOUTH AND THROAT WITH OR WITHOUT PRESENCE OF ULCERS (sore throat, sores in mouth, or a toothache) . UNUSUAL RASH, SWELLING OR PAIN  . UNUSUAL VAGINAL DISCHARGE OR ITCHING   Items with * indicate a potential emergency and should be followed up as soon as possible or go to the Emergency Department if any problems should occur.  Please show the CHEMOTHERAPY ALERT CARD or IMMUNOTHERAPY ALERT CARD  at check-in to the Emergency Department and triage nurse.  Should you have questions after your visit or need to cancel or reschedule your appointment, please contact [facility]  Dept: [phone]  and follow the prompts.  Office hours are 8:00 a.m. to 4:30 p.m. Monday - Friday. Please note that voicemails left after 4:00 p.m. may not be returned until the following business day.  We are closed weekends and major holidays. You have access to a nurse at all times for urgent questions. Please call the main number to the clinic Dept: [phone] and follow the prompts.   For any non-urgent questions, you may also contact your provider using MyChart. We now offer e-Visits for anyone 27 and older to request care online for non-urgent symptoms. For details visit [email or web address].   Also download the MyChart app! Go to the app store, search "MyChart", open the app, select [facility], and log in with your MyChart username and password.  Due to Covid, a mask is required upon entering the hospital/clinic. If you do not have a mask, one will be given to you upon arrival. For doctor visits, patients may have 1 support person aged 26 or older with them. For treatment visits, patients cannot have anyone with them due to current Covid guidelines and our immunocompromised population.   Rituximab Injection What is this medicine? RITUXIMAB (ri TUX i mab) is a monoclonal antibody. It is used to treat certain types of cancer like non-Hodgkin lymphoma and chronic lymphocytic leukemia. It is also used to treat rheumatoid arthritis, granulomatosis with  polyangiitis, microscopic polyangiitis, and pemphigus vulgaris. This medicine may be used for other purposes; ask your health care provider or pharmacist if you have questions. COMMON BRAND NAME(S): RIABNI, Rituxan, RUXIENCE What should I tell my health care provider before I take this medicine? They need to know if you have any of  these conditions:  chest pain  heart disease  infection especially a viral infection such as chickenpox, cold sores, hepatitis B, or herpes  immune system problems  irregular heartbeat or rhythm  kidney disease  low blood counts (white cells, platelets, or red cells)  lung disease  recent or upcoming vaccine  an unusual or allergic reaction to rituximab, other medicines, foods, dyes, or preservatives  pregnant or trying to get pregnant  breast-feeding How should I use this medicine? This medicine is injected into a vein. It is given by a health care provider in a hospital or clinic setting. A special MedGuide will be given to you before each treatment. Be sure to read this information carefully each time. Talk to your health care provider about the use of this medicine in children. While this drug may be prescribed for children as young as 2 years for selected conditions, precautions do apply. Overdosage: If you think you have taken too much of this medicine contact a poison control center or emergency room at once. NOTE: This medicine is only for you. Do not share this medicine with others. What if I miss a dose? Keep appointments for follow-up doses. It is important not to miss your dose. Call your health care provider if you are unable to keep an appointment. What may interact with this medicine? Do not take this medicine with any of the following medicines:  live vaccines This medicine may also interact with the following medicines:  cisplatin This list may not describe all possible interactions. Give your health care provider a list of all the medicines, herbs, non-prescription drugs, or dietary supplements you use. Also tell them if you smoke, drink alcohol, or use illegal drugs. Some items may interact with your medicine. What should I watch for while using this medicine? Your condition will be monitored carefully while you are receiving this medicine. You may need  blood work done while you are taking this medicine. This medicine can cause serious infusion reactions. To reduce the risk your health care provider may give you other medicines to take before receiving this one. Be sure to follow the directions from your health care provider. This medicine may increase your risk of getting an infection. Call your health care provider for advice if you get a fever, chills, sore throat, or other symptoms of a cold or flu. Do not treat yourself. Try to avoid being around people who are sick. Call your health care provider if you are around anyone with measles, chickenpox, or if you develop sores or blisters that do not heal properly. Avoid taking medicines that contain aspirin, acetaminophen, ibuprofen, naproxen, or ketoprofen unless instructed by your health care provider. These medicines may hide a fever. This medicine may cause serious skin reactions. They can happen weeks to months after starting the medicine. Contact your health care provider right away if you notice fevers or flu-like symptoms with a rash. The rash may be red or purple and then turn into blisters or peeling of the skin. Or, you might notice a red rash with swelling of the face, lips or lymph nodes in your neck or under your arms. In some patients, this medicine  may cause a serious brain infection that may cause death. If you have any problems seeing, thinking, speaking, walking, or standing, tell your healthcare professional right away. If you cannot reach your healthcare professional, urgently seek other source of medical care. Do not become pregnant while taking this medicine or for at least 12 months after stopping it. Women should inform their health care provider if they wish to become pregnant or think they might be pregnant. There is potential for serious harm to an unborn child. Talk to your health care provider for more information. Women should use a reliable form of birth control while taking  this medicine and for 12 months after stopping it. Do not breast-feed while taking this medicine or for at least 6 months after stopping it. What side effects may I notice from receiving this medicine? Side effects that you should report to your health care provider as soon as possible:  allergic reactions (skin rash, itching or hives; swelling of the face, lips, or tongue)  diarrhea  edema (sudden weight gain; swelling of the ankles, feet, hands or other unusual swelling; trouble breathing)  fast, irregular heartbeat  heart attack (trouble breathing; pain or tightness in the chest, neck, back or arms; unusually weak or tired)  infection (fever, chills, cough, sore throat, pain or trouble passing urine)  kidney injury (trouble passing urine or change in the amount of urine)  liver injury (dark yellow or brown urine; general ill feeling or flu-like symptoms; loss of appetite, right upper belly pain; unusually weak or tired, yellowing of the eyes or skin)  low blood pressure (dizziness; feeling faint or lightheaded, falls; unusually weak or tired)  low red blood cell counts (trouble breathing; feeling faint; lightheaded, falls; unusually weak or tired)  mouth sores  redness, blistering, peeling, or loosening of the skin, including inside the mouth  stomach pain  unusual bruising or bleeding  wheezing (trouble breathing with loud or whistling sounds)  vomiting Side effects that usually do not require medical attention (report to your health care provider if they continue or are bothersome):  headache  joint pain  muscle cramps, pain  nausea This list may not describe all possible side effects. Call your doctor for medical advice about side effects. You may report side effects to FDA at 1-800-FDA-1088. Where should I keep my medicine? This medicine is given in a hospital or clinic. It will not be stored at home. NOTE: This sheet is a summary. It may not cover all possible  information. If you have questions about this medicine, talk to your doctor, pharmacist, or health care provider.  2021 Elsevier/Gold Standard (2020-06-20 21:35:50)

## 2021-02-24 ENCOUNTER — Telehealth: Payer: Self-pay | Admitting: Hematology

## 2021-02-24 NOTE — Telephone Encounter (Signed)
Scheduled follow-up appointments per 6/3 los. Patient is aware.

## 2021-02-27 ENCOUNTER — Encounter: Payer: Self-pay | Admitting: Hematology

## 2021-03-03 DIAGNOSIS — D485 Neoplasm of uncertain behavior of skin: Secondary | ICD-10-CM | POA: Diagnosis not present

## 2021-03-03 DIAGNOSIS — L989 Disorder of the skin and subcutaneous tissue, unspecified: Secondary | ICD-10-CM | POA: Diagnosis not present

## 2021-03-04 NOTE — Progress Notes (Signed)
Diagnosis: Covid  Provider:  Marshell Garfinkel, MD  Procedure: Infusion  IV Type: Peripheral, IV Location: L Hand  Bebtelovimab, Dose: 175mg   Infusion Start Time: 1410  Infusion Stop Time: 1411  Post Infusion IV Care: Observation period completed and Peripheral IV Discontinued  Discharge: Condition: Good, Destination: Home . AVS provided to patient.   Performed by:  [employer] [employer]/gallery curator

## 2021-03-05 DIAGNOSIS — L570 Actinic keratosis: Secondary | ICD-10-CM | POA: Diagnosis not present

## 2021-03-05 DIAGNOSIS — D229 Melanocytic nevi, unspecified: Secondary | ICD-10-CM | POA: Diagnosis not present

## 2021-03-05 DIAGNOSIS — Z85820 Personal history of malignant melanoma of skin: Secondary | ICD-10-CM | POA: Diagnosis not present

## 2021-03-05 DIAGNOSIS — L819 Disorder of pigmentation, unspecified: Secondary | ICD-10-CM | POA: Diagnosis not present

## 2021-03-05 DIAGNOSIS — L821 Other seborrheic keratosis: Secondary | ICD-10-CM | POA: Diagnosis not present

## 2021-03-05 DIAGNOSIS — L905 Scar conditions and fibrosis of skin: Secondary | ICD-10-CM | POA: Diagnosis not present

## 2021-03-05 DIAGNOSIS — Z85828 Personal history of other malignant neoplasm of skin: Secondary | ICD-10-CM | POA: Diagnosis not present

## 2021-03-05 DIAGNOSIS — L814 Other melanin hyperpigmentation: Secondary | ICD-10-CM | POA: Diagnosis not present

## 2021-03-12 IMAGING — CT NM PET TUM IMG RESTAG (PS) SKULL BASE T - THIGH
7 series · 25 of 25 positions shown · non-contrast
Comparison: 06/19/2020

CLINICAL DATA: Subsequent treatment strategy for mantle cell
lymphoma. Evaluate after 3 cycles of chemotherapy.

EXAM:
NUCLEAR MEDICINE PET SKULL BASE TO THIGH
TECHNIQUE: 8.9 mCi F-18 FDG was injected intravenously. Full-ring PET imaging
was performed from the skull base to thigh after the radiotracer. CT
data was obtained and used for attenuation correction and anatomic
localization.
Fasting blood glucose: 99 mg/dl

[Series 3: pet sk_thigh ac · axial · 5.0mm · 4.07mm/px · z∈[-920,+4]mm · 6 of 232 slices shown]
[im 1/232]
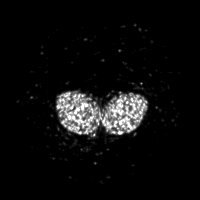
[im 47/232]
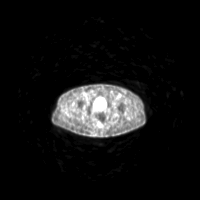
[im 93/232]
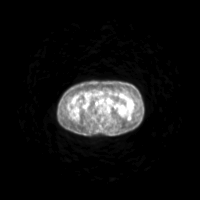
[im 139/232]
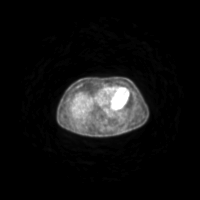
[im 185/232]
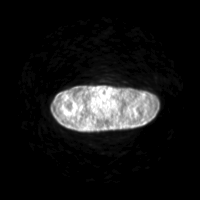
[im 232/232]
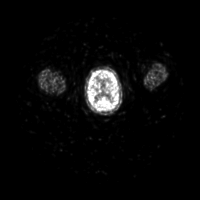

[Series 4: ct sk_thigh 5.0 bf37 · axial · 5.0mm · 0.98mm/px · z∈[-920,+4]mm · 5 of 232 slices shown]
[im 1/232]
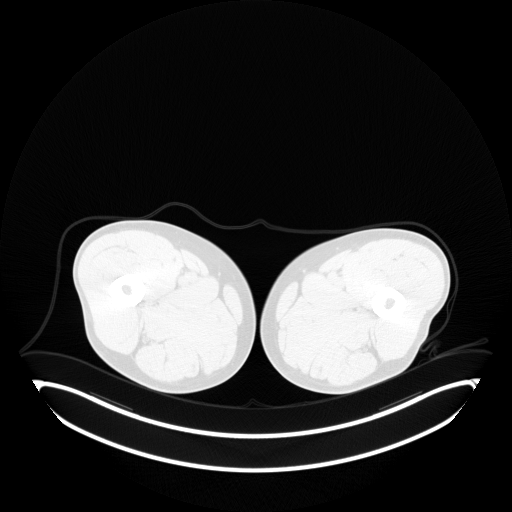
[im 58/232]
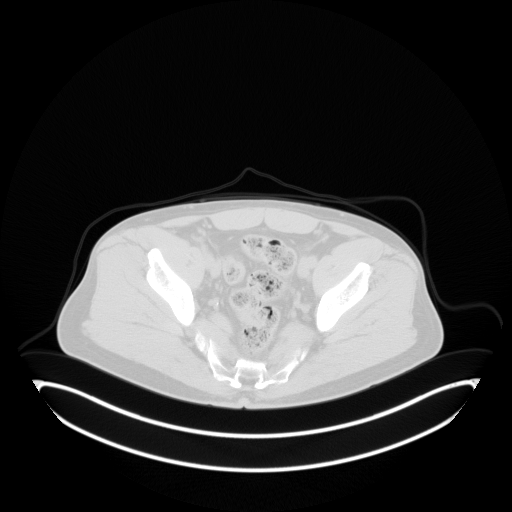
[im 116/232]
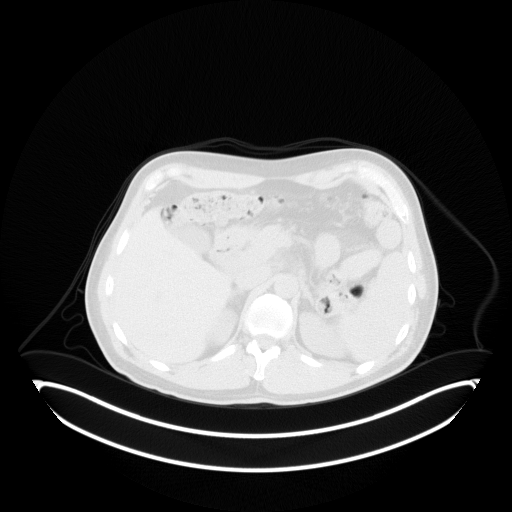
[im 174/232]
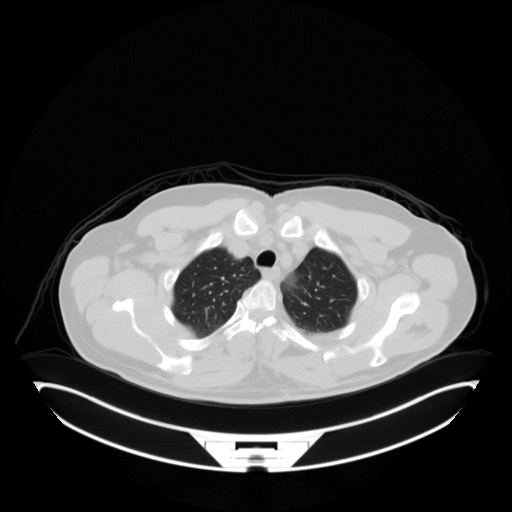
[im 232/232  brain]
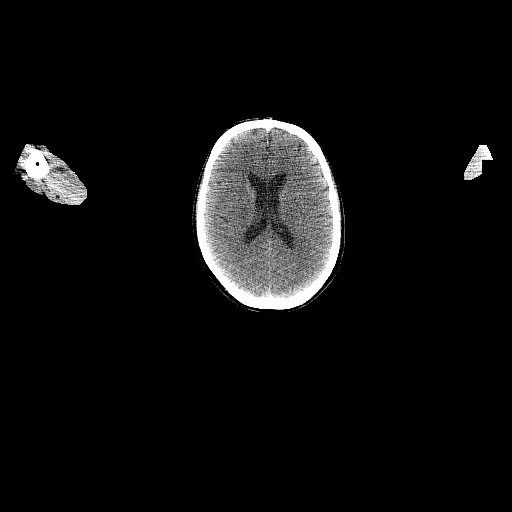

[Series 5: pet sk_thigh nac · axial · 5.0mm · 4.07mm/px · z∈[-920,+4]mm · 5 of 232 slices shown]
[im 1/232]
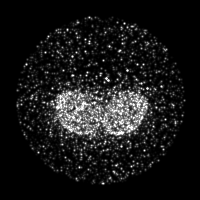
[im 58/232]
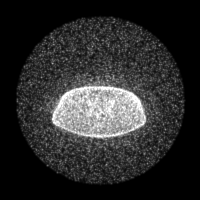
[im 116/232]
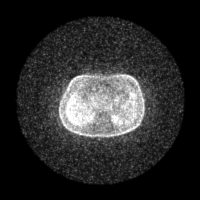
[im 174/232]
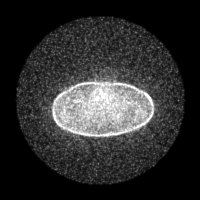
[im 232/232]
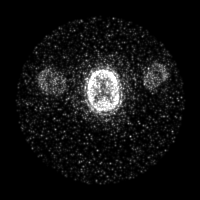

[Series 8: ct sk_thigh 5.0 br59 (id)_bone · axial · 5.0mm · 0.72mm/px · z∈[-410,-150]mm · 2 of 66 slices shown]
[im 1/66]
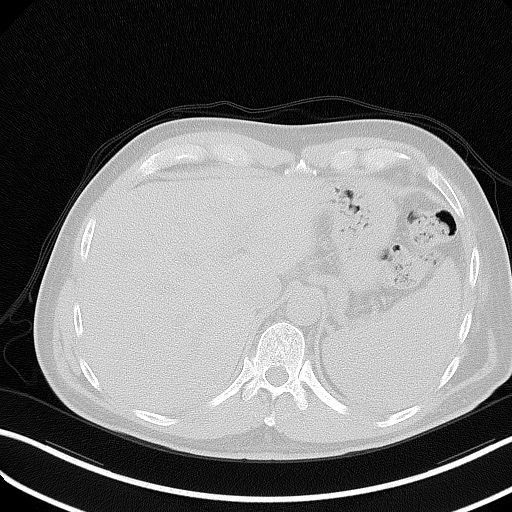
[im 66/66]
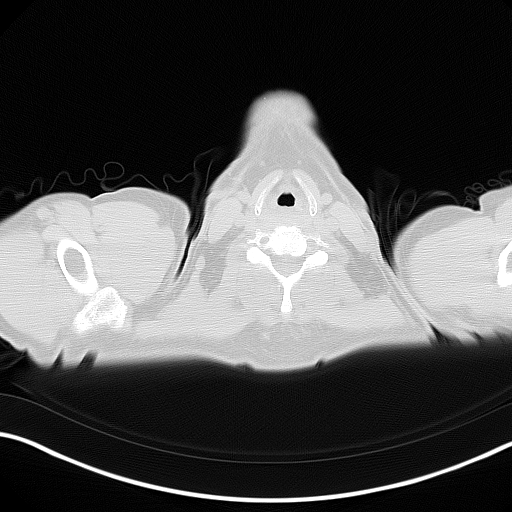

[Series 603: <mip collection> · coronal · 1.92mm/px · 1 of 32 slices shown]
[im 1/32]
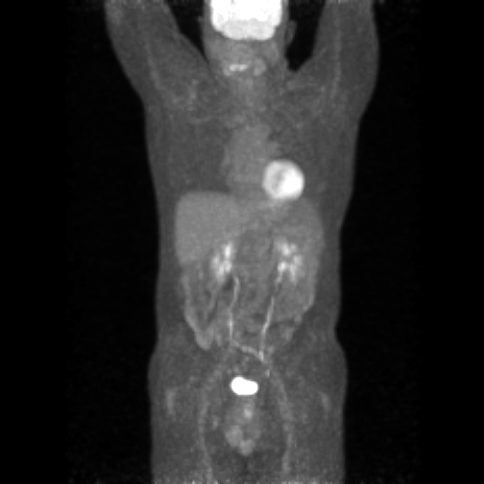

[Series 604: fused cor · 1 of 46 slices shown]
[im 1/46]
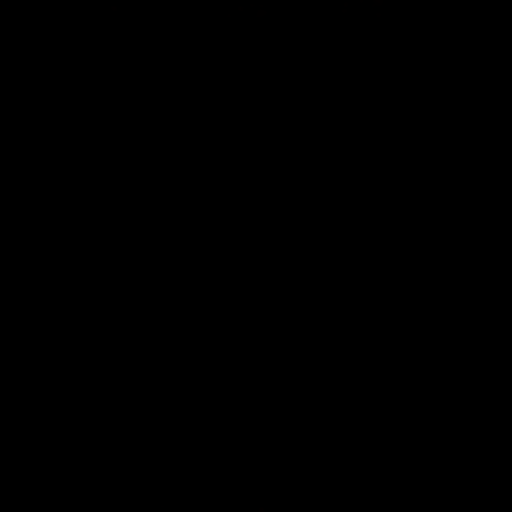

[Series 605: range-ct sk_thigh 5.0 bf37-tra-<alpha range> · 5 of 223 slices shown]
[im 1/223]
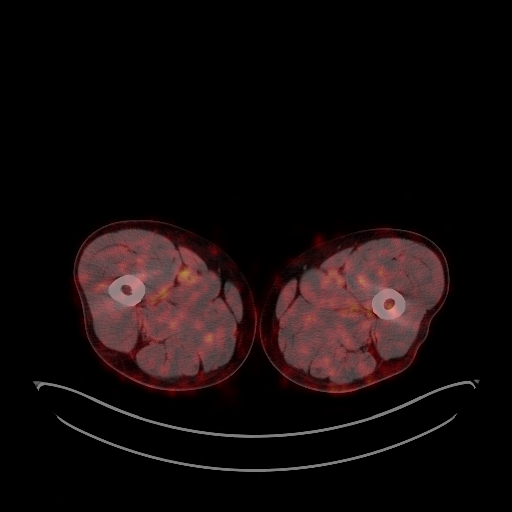
[im 56/223]
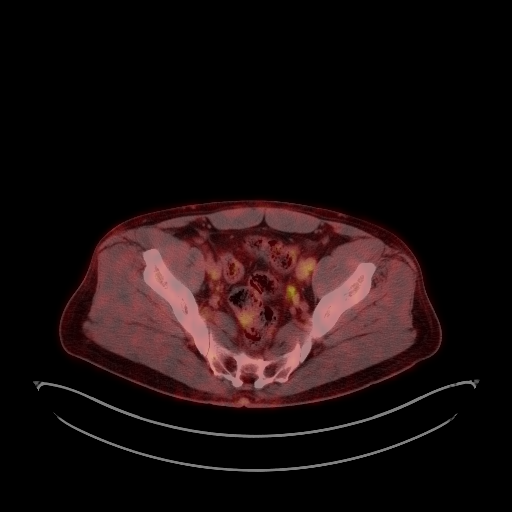
[im 112/223]
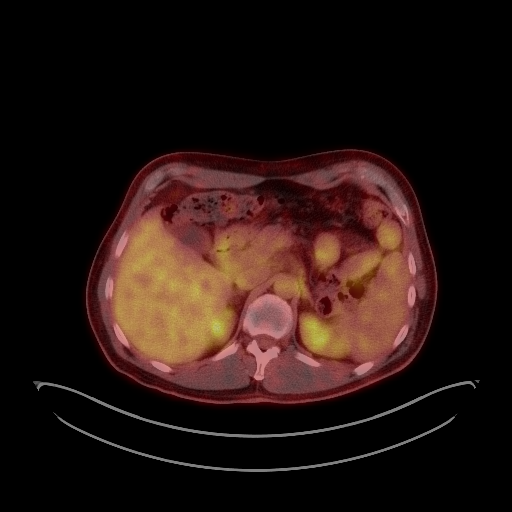
[im 167/223]
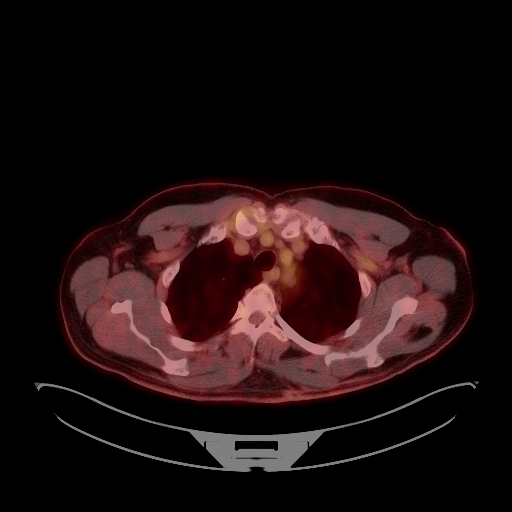
[im 223/223]
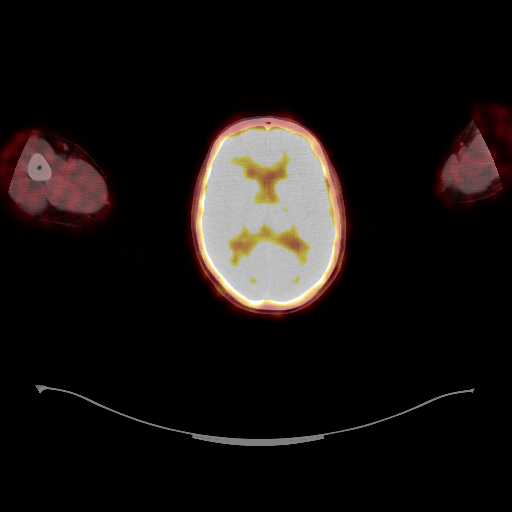

[25 of 25 positions shown; findings below may reference images not displayed]

FINDINGS: Mediastinal blood pool activity: SUV max

Liver activity: SUV max

NECK: Resolution of previously described hypermetabolic cervical
nodes.

Incidental CT findings: No cervical adenopathy.

CHEST: No residual thoracic nodal and no pulmonary parenchymal
hypermetabolism.

Incidental CT findings: No residual thoracic adenopathy. Clear
lungs.

ABDOMEN/PELVIS: Interval resolution of abdominopelvic nodal
hypermetabolism. No residual splenic hypermetabolism.

Incidental CT findings: Resolution of splenomegaly. The spleen
measures 12.2 cm in greatest transverse dimension today versus
cm on the prior exam.

Normal adrenal glands.  No residual abdominopelvic adenopathy.

Abdominal aortic atherosclerosis.

SKELETON: No abnormal marrow activity.

Incidental CT findings: none
IMPRESSION: 1. Complete metabolic response to therapy.
2. Resolution of splenomegaly and splenic hypermetabolism.
3.  Aortic Atherosclerosis ([id number].T).

## 2021-04-07 ENCOUNTER — Other Ambulatory Visit (HOSPITAL_COMMUNITY): Payer: Self-pay

## 2021-04-21 NOTE — Progress Notes (Incomplete)
HEMATOLOGY/ONCOLOGY CLINIC NOTE  Date of Service: 04/21/2021  Patient Care Team: [name], MD as PCP - General (Family Medicine)  CHIEF COMPLAINTS/PURPOSE OF CONSULTATION:  Continue mx of mantle cell lymphoma  HISTORY OF PRESENTING ILLNESS:   Brian Kaiser is a wonderful 65 y.o. male who has been referred to [place] by Dr. [name] for evaluation and management of concern for lymphoma. Pt is accompanied today by his wife, [name]. The pt reports that he is doing well overall.    The pt reports that last November he had an Upper Endoscopy because he was having abdominal bloating. Pt was found to have gastritis and was given Protonix and Pepcid to use as needed. Pt has not used Protonix and Pepcid in two months. His abdominal bloating has been intermittent since November, but has been better recently.   Last July pt had surgery on his right forearm and was experiencing difficulty sleeping due to him continuously rolling on the arm. This lasted for some time. Pt had his second COVID19 vaccine in February and his second Shingles vaccine two weeks later. After this he began experiencing fatigue, which is still present. His fatigue is not progressive and he is able to complete his daily task. Pt has drenching night sweats that started two months ago. These do not occur every night. He has lost 15 lbs in the last few months without effort, but started back going to the gym in May. Pt has noticed that left inguinal lymph node has fluctuated in size since he was diagnosed with Hepatitis B nearly 20 years ago. Recently the lymph node has grown in size and become more firm. He has also noticed an increase in the size of a right cervical lymph node. Pt has had left testicular discomfort in the last few weeks. He reports b/l ankle swelling, calf cramping and upper left abdominal pain with certain sleeping positions.  His Hepatitis B was picked up after he was experiencing abdominal discomfort and was  treated with milk thistle. It has been monitored via labwork over the years. Pt had a Basal Cell lesion on his left thigh and back a few months ago. They have been removed.   Of note prior to the patient's visit today, pt has had CT C/A/P ([id number]) completed on 06/06/2020 with results revealing "Extensive adenopathy in the chest, abdomen and pelvis. Marked splenomegaly. Findings most concerning for lymphoma. Small amount of free fluid in the abdomen and pelvis."   Most recent lab results (06/03/2020) of CBC w/diff & CMP is as follows: all values are WNL except for PLT at 135K. 06/03/2020 TSH at 7.86 06/03/2020 PSA at 0.20 06/03/2020 Sed Rate at 1  On review of systems, pt reports night sweats, new lumps/bumps, ankle swelling, calf cramping, constipation, change in color of bowel movements, left testicular pain, fatigue, unexpected weight loss, improving abdominal bloating, early satiety and denies SOB, chest pain, fevers, chills, bloody/black stools, headaches, vision changes, bone pain, back pain, flank pain and any other symptoms.   On PMHx the pt reports GERD, Forearm laceration, Right Forearm Ligament Repair, Hepatitis B, Hyperlipidemia. On Family Hx the pt reports that his brother was diagnosed with DLBCL a year ago.  INTERVAL HISTORY:  Brian Kaiser is a wonderful 65 y.o. male who is here for evaluation and management of his Mantle Cell Lymphoma. The patient's last visit with [place] was on 02/20/2021. He is accompanied here today by his wife. The pt reports that he is doing well overall.  The pt reports ***  Lab results today 04/22/2021 of CBC w/diff and CMP is as follows: all values are WNL except for ***  On review of systems, pt reports *** and denies *** and any other symptoms.   MEDICAL HISTORY:  Past Medical History:  Diagnosis Date   Allergy    Forearm laceration    right forearm   GERD (gastroesophageal reflux disease)    Hepatitis B    Hyperlipidemia    Pulmonary lesion     Left pulmonary schwannoma   Schwannoma    left lower lung    SURGICAL HISTORY: Past Surgical History:  Procedure Laterality Date   COLONOSCOPY     LIGAMENT REPAIR Right 04/19/2019   Right forearm   LIPOMA EXCISION Left    neck   LUNG SURGERY Left    OTHER SURGICAL HISTORY Left    pulmonary schwannoma excision   UPPER GASTROINTESTINAL ENDOSCOPY     WOUND EXPLORATION Right 04/19/2019   Procedure: WOUND EXPLORATION; REPAIR RIGHT FOREARM LACERATION;  Surgeon: Charlotte Crumb, MD;  Location: [facility];  Service: Orthopedics;  Laterality: Right;  AXILLARY BLOCK AND MAC    SOCIAL HISTORY: Social History   Socioeconomic History   Marital status: Married    Spouse name: Not on file   Number of children: Not on file   Years of education: Not on file   Highest education level: Not on file  Occupational History   Not on file  Tobacco Use   Smoking status: Never   Smokeless tobacco: Never  Vaping Use   Vaping Use: Never used  Substance and Sexual Activity   Alcohol use: No    Alcohol/week: 0.0 standard drinks   Drug use: No   Sexual activity: Not on file  Other Topics Concern   Not on file  Social History Narrative   Not on file   Social Determinants of Health   Financial Resource Strain: Not on file  Food Insecurity: Not on file  Transportation Needs: Not on file  Physical Activity: Not on file  Stress: Not on file  Social Connections: Not on file  Intimate Partner Violence: Not on file    FAMILY HISTORY: Family History  Problem Relation Age of Onset   Macular degeneration Mother    Dementia Mother    Diverticulitis Mother    Prostate cancer Paternal Grandfather    Diverticulitis Brother    Colon cancer Neg Hx    Colon polyps Neg Hx    Esophageal cancer Neg Hx    Liver cancer Neg Hx    Pancreatic cancer Neg Hx    Rectal cancer Neg Hx    Stomach cancer Neg Hx     ALLERGIES:  is allergic to augmentin [amoxicillin-pot  clavulanate].  MEDICATIONS:  Current Outpatient Medications  Medication Sig Dispense Refill   acetaminophen (TYLENOL) 325 MG tablet Take 325 mg by mouth every 6 (six) hours as needed for moderate pain or headache.     allopurinol (ZYLOPRIM) 100 MG tablet TAKE 1 TABLET BY MOUTH TWO TIMES DAILY 60 tablet 0   cholecalciferol (VITAMIN D3) 25 MCG (1000 UNIT) tablet Take 1,000 Units by mouth daily.     COVID-19 At Home Antigen Test KIT USE AS DIRECTED WITHIN PACKAGE INSTRUCTIONS 4 kit 0   dexamethasone (DECADRON) 4 MG tablet TAKE 2 TABLETS BY MOUTH DAILY START DAY AFTER CHEMO 30 tablet 0   famotidine (PEPCID) 20 MG tablet Take 1 tablet (20 mg total) by mouth at bedtime. (  Patient taking differently: Take 20 mg by mouth daily as needed for heartburn. ) 30 tablet 3   Multiple Vitamin (MULTIVITAMIN) tablet Take 1 tablet by mouth daily.     Omega-3 Fatty Acids (FISH OIL) 1000 MG CAPS Take 1,000 mg by mouth daily.     pantoprazole (PROTONIX) 20 MG tablet Take 1 tablet  by mouth daily. 90 tablet 1   pantoprazole (PROTONIX) 40 MG tablet Take 1 tablet (40 mg total) by mouth daily. 90 tablet 3   vitamin C (ASCORBIC ACID) 250 MG tablet Take 250 mg by mouth daily.     Current Facility-Administered Medications  Medication Dose Route Frequency Provider Last Rate Last Admin   0.9 %  sodium chloride infusion   Intravenous PRN Eileen Stanford, PA-C        REVIEW OF SYSTEMS:   10 Point review of Systems was done is negative except as noted above.  PHYSICAL EXAMINATION: ECOG PERFORMANCE STATUS: 1 - Symptomatic but completely ambulatory  There were no vitals filed for this visit. There were no vitals filed for this visit. .There is no height or weight on file to calculate BMI.   *** GENERAL:alert, in no acute distress and comfortable SKIN: no acute rashes, no significant lesions EYES: conjunctiva are pink and non-injected, sclera anicteric OROPHARYNX: MMM, no exudates, no oropharyngeal erythema or  ulceration NECK: supple, no JVD LYMPH:  no palpable lymphadenopathy in the cervical, axillary or inguinal regions LUNGS: clear to auscultation b/l with normal respiratory effort HEART: regular rate & rhythm ABDOMEN:  normoactive bowel sounds , non tender, not distended. Extremity: no pedal edema PSYCH: alert & oriented x 3 with fluent speech NEURO: no focal motor/sensory deficits    LABORATORY DATA:  I have reviewed the data as listed  . CBC Latest Ref Rng & Units 02/20/2021 01/14/2021 11/20/2020  WBC 4.0 - 10.5 K/uL 3.7(L) 4.3 5.8  Hemoglobin 13.0 - 17.0 g/dL 14.5 13.9 13.2  Hematocrit 39.0 - 52.0 % 41.8 39.9 38.4(L)  Platelets 150 - 400 K/uL 184 169 217   ANC 800 . CMP Latest Ref Rng & Units 02/20/2021 01/14/2021 11/20/2020  Glucose 70 - 99 mg/dL 106(H) 94 90  BUN 8 - 23 mg/dL _0 Creatinine 0.61 - 1.24 mg/dL 1.25(H) 1.06 1.05  Sodium 135 - 145 mmol/L 141 144 141  Potassium 3.5 - 5.1 mmol/L 4.2 4.1 4.1  Chloride 98 - 111 mmol/L 105 107 107  CO2 22 - 32 mmol/L _1 Calcium 8.9 - 10.3 mg/dL 9.5 9.3 8.9  Total Protein 6.5 - 8.1 g/dL 7.3 6.8 6.5  Total Bilirubin 0.3 - 1.2 mg/dL 0.5 0.4 0.5  Alkaline Phos 38 - 126 U/L 74 68 75  AST 15 - 41 U/L 35 29 40  ALT 0 - 44 U/L _2 06/20/2020 Right Axillary Surgical Pathology Report [id number]):    06/20/2020 Right Axillary Lymph Node Flow Pathology ([id number]):   06/11/2020 Flow Pathology ([id number]):   RADIOGRAPHIC STUDIES: I have personally reviewed the radiological images as listed and agreed with the findings in the report. No results found.  ASSESSMENT & PLAN:   65 yo with   1) Recently diagnosed stage IV mantle cell lymphoma with extensive lymphadenopathy and massive splenomegaly . 2) s/p massive splenomegaly likely related to mantle cell lymphoma  3) thrombocytopenia-mild platelets of 132k likely related to lymphoma. 4) hepatitis B core antibody positive, surface antigen negative, HBV DNA PCR  negative 5) elevated LDH due to  lymphoma  PLAN: -Discussed pt labwork, 04/22/2021; ***  -Recommended pt continue to stay active, eat healthy, and get ample sleep. -Advised pt to continue to f/u w dermatologist q35month for routine screenings. -Recommended sun protection due to increased skin sensitivity while outside. -Continue maintenance Rituxan q233month The pt has no prohibitive toxicities at this time. Will continue to monitor. -Will see back in ***   FOLLOW UP: ***  The total time spent in the appointment was *** minutes and more than 50% was on counseling and direct patient cares.   All of the patient's questions were answered with apparent satisfaction. The patient knows to call the clinic with any problems, questions or concerns.    GaSullivan LoneD MSClarksburgAHIVMS SCPacmed AscTCadence Ambulatory Surgery Center LLCematology/Oncology Physician CoArkansas Specialty Surgery Center(Office):       [phone] cell):  [phone]):           [phone]/09/2020 8:26 PM  I, RoReinaldo Raddleam acting as scribe for Dr. GaSullivan LoneMD.

## 2021-04-22 ENCOUNTER — Other Ambulatory Visit: Payer: Self-pay

## 2021-04-22 ENCOUNTER — Ambulatory Visit: Payer: 59

## 2021-04-22 ENCOUNTER — Ambulatory Visit: Payer: 59 | Admitting: Hematology

## 2021-04-22 ENCOUNTER — Inpatient Hospital Stay: Payer: 59 | Attending: Hematology

## 2021-04-22 ENCOUNTER — Inpatient Hospital Stay: Payer: 59 | Admitting: Hematology

## 2021-04-22 ENCOUNTER — Inpatient Hospital Stay: Payer: 59

## 2021-04-22 ENCOUNTER — Other Ambulatory Visit: Payer: 59

## 2021-04-22 VITALS — BP 153/93 | HR 55 | Temp 98.0°F | Resp 16

## 2021-04-22 DIAGNOSIS — Z79899 Other long term (current) drug therapy: Secondary | ICD-10-CM | POA: Insufficient documentation

## 2021-04-22 DIAGNOSIS — C8318 Mantle cell lymphoma, lymph nodes of multiple sites: Secondary | ICD-10-CM

## 2021-04-22 DIAGNOSIS — Z5112 Encounter for antineoplastic immunotherapy: Secondary | ICD-10-CM | POA: Insufficient documentation

## 2021-04-22 DIAGNOSIS — C8314 Mantle cell lymphoma, lymph nodes of axilla and upper limb: Secondary | ICD-10-CM | POA: Diagnosis not present

## 2021-04-22 DIAGNOSIS — Z7189 Other specified counseling: Secondary | ICD-10-CM

## 2021-04-22 LAB — CBC WITH DIFFERENTIAL/PLATELET
Abs Immature Granulocytes: 0.17 10*3/uL — ABNORMAL HIGH (ref 0.00–0.07)
Basophils Absolute: 0 10*3/uL (ref 0.0–0.1)
Basophils Relative: 1 %
Eosinophils Absolute: 0.1 10*3/uL (ref 0.0–0.5)
Eosinophils Relative: 2 %
HCT: 40.7 % (ref 39.0–52.0)
Hemoglobin: 14.5 g/dL (ref 13.0–17.0)
Immature Granulocytes: 6 %
Lymphocytes Relative: 36 %
Lymphs Abs: 1.1 10*3/uL (ref 0.7–4.0)
MCH: 31.2 pg (ref 26.0–34.0)
MCHC: 35.6 g/dL (ref 30.0–36.0)
MCV: 87.5 fL (ref 80.0–100.0)
Monocytes Absolute: 0.4 10*3/uL (ref 0.1–1.0)
Monocytes Relative: 14 %
Neutro Abs: 1.2 10*3/uL — ABNORMAL LOW (ref 1.7–7.7)
Neutrophils Relative %: 41 %
Platelets: 153 10*3/uL (ref 150–400)
RBC: 4.65 MIL/uL (ref 4.22–5.81)
RDW: 13 % (ref 11.5–15.5)
WBC: 3 10*3/uL — ABNORMAL LOW (ref 4.0–10.5)
nRBC: 0 % (ref 0.0–0.2)

## 2021-04-22 LAB — CMP (CANCER CENTER ONLY)
ALT: 26 U/L (ref 0–44)
AST: 27 U/L (ref 15–41)
Albumin: 3.8 g/dL (ref 3.5–5.0)
Alkaline Phosphatase: 91 U/L (ref 38–126)
Anion gap: 9 (ref 5–15)
BUN: 14 mg/dL (ref 8–23)
CO2: 23 mmol/L (ref 22–32)
Calcium: 9.4 mg/dL (ref 8.9–10.3)
Chloride: 109 mmol/L (ref 98–111)
Creatinine: 1.07 mg/dL (ref 0.61–1.24)
GFR, Estimated: >60 mL/min (ref >60)
Glucose, Bld: 86 mg/dL (ref 70–99)
Potassium: 3.9 mmol/L (ref 3.5–5.1)
Sodium: 141 mmol/L (ref 135–145)
Total Bilirubin: 0.4 mg/dL (ref 0.3–1.2)
Total Protein: 6.9 g/dL (ref 6.5–8.1)

## 2021-04-22 MED ORDER — ACETAMINOPHEN 325 MG PO TABS
ORAL_TABLET | ORAL | Status: AC
  Filled 2021-04-22: qty 2

## 2021-04-22 MED ORDER — SODIUM CHLORIDE 0.9 % IV SOLN
Freq: Once | INTRAVENOUS | Status: AC
  Filled 2021-04-22: qty 250

## 2021-04-22 MED ORDER — ACETAMINOPHEN 325 MG PO TABS
650.0000 mg | ORAL_TABLET | Freq: Once | ORAL | Status: AC
  Administered 2021-04-22: 650 mg via ORAL

## 2021-04-22 MED ORDER — FAMOTIDINE 20 MG IN NS 100 ML IVPB
20.0000 mg | Freq: Once | INTRAVENOUS | Status: AC
  Administered 2021-04-22: 20 mg via INTRAVENOUS

## 2021-04-22 MED ORDER — FAMOTIDINE 20 MG IN NS 100 ML IVPB
INTRAVENOUS | Status: AC
  Filled 2021-04-22: qty 100

## 2021-04-22 MED ORDER — METHYLPREDNISOLONE SODIUM SUCC 125 MG IJ SOLR
INTRAMUSCULAR | Status: AC
  Filled 2021-04-22: qty 2

## 2021-04-22 MED ORDER — DIPHENHYDRAMINE HCL 25 MG PO CAPS
ORAL_CAPSULE | ORAL | Status: AC
  Filled 2021-04-22: qty 2

## 2021-04-22 MED ORDER — DIPHENHYDRAMINE HCL 25 MG PO CAPS
50.0000 mg | ORAL_CAPSULE | Freq: Once | ORAL | Status: AC
  Administered 2021-04-22: 50 mg via ORAL

## 2021-04-22 MED ORDER — SODIUM CHLORIDE 0.9 % IV SOLN
375.0000 mg/m2 | Freq: Once | INTRAVENOUS | Status: AC
  Administered 2021-04-22: 800 mg via INTRAVENOUS
  Filled 2021-04-22: qty 50

## 2021-04-22 MED ORDER — METHYLPREDNISOLONE SODIUM SUCC 125 MG IJ SOLR
125.0000 mg | Freq: Once | INTRAMUSCULAR | Status: AC
  Administered 2021-04-22: 125 mg via INTRAVENOUS

## 2021-04-22 NOTE — Patient Instructions (Signed)
[facility]   Discharge Instructions: Thank you for choosing [facility] to provide your oncology and hematology care.   If you have a lab appointment with the [facility], please go directly to the [facility] and check in at the registration area.   Wear comfortable clothing and clothing appropriate for easy access to any Portacath or PICC line.   We strive to give you quality time with your provider. You may need to reschedule your appointment if you arrive late (15 or more minutes).  Arriving late affects you and other patients whose appointments are after yours.  Also, if you miss three or more appointments without notifying the office, you may be dismissed from the clinic at the provider's discretion.      For prescription refill requests, have your pharmacy contact our office and allow 72 hours for refills to be completed.    Today you received the following chemotherapy and/or immunotherapy agents: rituximab.      To help prevent nausea and vomiting after your treatment, we encourage you to take your nausea medication as directed.  BELOW ARE SYMPTOMS THAT SHOULD BE REPORTED IMMEDIATELY: *FEVER GREATER THAN 100.4 F (38 C) OR HIGHER *CHILLS OR SWEATING *NAUSEA AND VOMITING THAT IS NOT CONTROLLED WITH YOUR NAUSEA MEDICATION *UNUSUAL SHORTNESS OF BREATH *UNUSUAL BRUISING OR BLEEDING *URINARY PROBLEMS (pain or burning when urinating, or frequent urination) *BOWEL PROBLEMS (unusual diarrhea, constipation, pain near the anus) TENDERNESS IN MOUTH AND THROAT WITH OR WITHOUT PRESENCE OF ULCERS (sore throat, sores in mouth, or a toothache) UNUSUAL RASH, SWELLING OR PAIN  UNUSUAL VAGINAL DISCHARGE OR ITCHING   Items with * indicate a potential emergency and should be followed up as soon as possible or go to the Emergency Department if any problems should occur.  Please show the CHEMOTHERAPY ALERT CARD or IMMUNOTHERAPY ALERT CARD at check-in  to the Emergency Department and triage nurse.  Should you have questions after your visit or need to cancel or reschedule your appointment, please contact [facility]  Dept: [phone]  and follow the prompts.  Office hours are 8:00 a.m. to 4:30 p.m. Monday - Friday. Please note that voicemails left after 4:00 p.m. may not be returned until the following business day.  We are closed weekends and major holidays. You have access to a nurse at all times for urgent questions. Please call the main number to the clinic Dept: [phone] and follow the prompts.   For any non-urgent questions, you may also contact your provider using MyChart. We now offer e-Visits for anyone 72 and older to request care online for non-urgent symptoms. For details visit [email or web address].   Also download the MyChart app! Go to the app store, search "MyChart", open the app, select , and log in with your MyChart username and password.  Due to Covid, a mask is required upon entering the hospital/clinic. If you do not have a mask, one will be given to you upon arrival. For doctor visits, patients may have 1 support person aged 68 or older with them. For treatment visits, patients cannot have anyone with them due to current Covid guidelines and our immunocompromised population.

## 2021-05-14 ENCOUNTER — Telehealth: Payer: Self-pay | Admitting: Gastroenterology

## 2021-05-14 MED ORDER — PANTOPRAZOLE SODIUM 40 MG PO TBEC
40.0000 mg | DELAYED_RELEASE_TABLET | Freq: Every day | ORAL | 3 refills | Status: DC
  Filled 2021-05-23: qty 90, 90d supply, fill #0

## 2021-05-14 NOTE — Telephone Encounter (Signed)
Sent pantoprazole to [facility] in [place]

## 2021-05-14 NOTE — Telephone Encounter (Signed)
Patient called requesting a refill for Protonix

## 2021-05-23 ENCOUNTER — Other Ambulatory Visit (HOSPITAL_COMMUNITY): Payer: Self-pay

## 2021-06-05 DIAGNOSIS — L814 Other melanin hyperpigmentation: Secondary | ICD-10-CM | POA: Diagnosis not present

## 2021-06-05 DIAGNOSIS — Z08 Encounter for follow-up examination after completed treatment for malignant neoplasm: Secondary | ICD-10-CM | POA: Diagnosis not present

## 2021-06-05 DIAGNOSIS — Z85820 Personal history of malignant melanoma of skin: Secondary | ICD-10-CM | POA: Diagnosis not present

## 2021-06-05 DIAGNOSIS — L570 Actinic keratosis: Secondary | ICD-10-CM | POA: Diagnosis not present

## 2021-06-05 DIAGNOSIS — L819 Disorder of pigmentation, unspecified: Secondary | ICD-10-CM | POA: Diagnosis not present

## 2021-06-05 DIAGNOSIS — L821 Other seborrheic keratosis: Secondary | ICD-10-CM | POA: Diagnosis not present

## 2021-06-05 DIAGNOSIS — Z85828 Personal history of other malignant neoplasm of skin: Secondary | ICD-10-CM | POA: Diagnosis not present

## 2021-06-05 DIAGNOSIS — D225 Melanocytic nevi of trunk: Secondary | ICD-10-CM | POA: Diagnosis not present

## 2021-06-09 ENCOUNTER — Encounter: Payer: Self-pay | Admitting: Hematology

## 2021-06-20 ENCOUNTER — Other Ambulatory Visit: Payer: Self-pay | Admitting: *Deleted

## 2021-06-20 DIAGNOSIS — Z0000 Encounter for general adult medical examination without abnormal findings: Secondary | ICD-10-CM | POA: Diagnosis not present

## 2021-06-20 DIAGNOSIS — C8318 Mantle cell lymphoma, lymph nodes of multiple sites: Secondary | ICD-10-CM

## 2021-06-20 DIAGNOSIS — Z125 Encounter for screening for malignant neoplasm of prostate: Secondary | ICD-10-CM | POA: Diagnosis not present

## 2021-06-20 DIAGNOSIS — K219 Gastro-esophageal reflux disease without esophagitis: Secondary | ICD-10-CM | POA: Diagnosis not present

## 2021-06-20 DIAGNOSIS — Z85820 Personal history of malignant melanoma of skin: Secondary | ICD-10-CM | POA: Diagnosis not present

## 2021-06-20 DIAGNOSIS — R946 Abnormal results of thyroid function studies: Secondary | ICD-10-CM | POA: Diagnosis not present

## 2021-06-20 DIAGNOSIS — E781 Pure hyperglyceridemia: Secondary | ICD-10-CM | POA: Diagnosis not present

## 2021-06-20 DIAGNOSIS — I10 Essential (primary) hypertension: Secondary | ICD-10-CM | POA: Diagnosis not present

## 2021-06-20 DIAGNOSIS — R7989 Other specified abnormal findings of blood chemistry: Secondary | ICD-10-CM | POA: Diagnosis not present

## 2021-06-23 ENCOUNTER — Ambulatory Visit: Payer: 59

## 2021-06-23 ENCOUNTER — Other Ambulatory Visit: Payer: Self-pay

## 2021-06-23 ENCOUNTER — Inpatient Hospital Stay: Payer: 59 | Attending: Hematology

## 2021-06-23 ENCOUNTER — Other Ambulatory Visit: Payer: 59

## 2021-06-23 ENCOUNTER — Inpatient Hospital Stay (HOSPITAL_BASED_OUTPATIENT_CLINIC_OR_DEPARTMENT_OTHER): Payer: 59 | Admitting: Hematology

## 2021-06-23 ENCOUNTER — Other Ambulatory Visit (HOSPITAL_COMMUNITY): Payer: Self-pay

## 2021-06-23 ENCOUNTER — Inpatient Hospital Stay: Payer: 59

## 2021-06-23 ENCOUNTER — Ambulatory Visit: Payer: 59 | Admitting: Hematology

## 2021-06-23 VITALS — BP 142/92 | HR 62 | Temp 98.2°F | Resp 18

## 2021-06-23 DIAGNOSIS — Z5112 Encounter for antineoplastic immunotherapy: Secondary | ICD-10-CM | POA: Diagnosis not present

## 2021-06-23 DIAGNOSIS — C8318 Mantle cell lymphoma, lymph nodes of multiple sites: Secondary | ICD-10-CM

## 2021-06-23 DIAGNOSIS — Z7189 Other specified counseling: Secondary | ICD-10-CM

## 2021-06-23 DIAGNOSIS — Z79899 Other long term (current) drug therapy: Secondary | ICD-10-CM | POA: Diagnosis not present

## 2021-06-23 DIAGNOSIS — C8314 Mantle cell lymphoma, lymph nodes of axilla and upper limb: Secondary | ICD-10-CM | POA: Insufficient documentation

## 2021-06-23 LAB — CBC WITH DIFFERENTIAL (CANCER CENTER ONLY)
Abs Immature Granulocytes: 0.07 10*3/uL (ref 0.00–0.07)
Basophils Absolute: 0 10*3/uL (ref 0.0–0.1)
Basophils Relative: 1 %
Eosinophils Absolute: 0.1 10*3/uL (ref 0.0–0.5)
Eosinophils Relative: 2 %
HCT: 40.3 % (ref 39.0–52.0)
Hemoglobin: 14.4 g/dL (ref 13.0–17.0)
Immature Granulocytes: 2 %
Lymphocytes Relative: 28 %
Lymphs Abs: 0.8 10*3/uL (ref 0.7–4.0)
MCH: 31.6 pg (ref 26.0–34.0)
MCHC: 35.7 g/dL (ref 30.0–36.0)
MCV: 88.4 fL (ref 80.0–100.0)
Monocytes Absolute: 0.3 10*3/uL (ref 0.1–1.0)
Monocytes Relative: 12 %
Neutro Abs: 1.6 10*3/uL — ABNORMAL LOW (ref 1.7–7.7)
Neutrophils Relative %: 55 %
Platelet Count: 180 10*3/uL (ref 150–400)
RBC: 4.56 MIL/uL (ref 4.22–5.81)
RDW: 12.6 % (ref 11.5–15.5)
WBC Count: 3 10*3/uL — ABNORMAL LOW (ref 4.0–10.5)
nRBC: 0 % (ref 0.0–0.2)

## 2021-06-23 LAB — CMP (CANCER CENTER ONLY)
ALT: 29 U/L (ref 0–44)
AST: 34 U/L (ref 15–41)
Albumin: 4 g/dL (ref 3.5–5.0)
Alkaline Phosphatase: 92 U/L (ref 38–126)
Anion gap: 10 (ref 5–15)
BUN: 14 mg/dL (ref 8–23)
CO2: 24 mmol/L (ref 22–32)
Calcium: 9.3 mg/dL (ref 8.9–10.3)
Chloride: 108 mmol/L (ref 98–111)
Creatinine: 1.14 mg/dL (ref 0.61–1.24)
GFR, Estimated: >60 mL/min (ref >60)
Glucose, Bld: 87 mg/dL (ref 70–99)
Potassium: 4.4 mmol/L (ref 3.5–5.1)
Sodium: 142 mmol/L (ref 135–145)
Total Bilirubin: 0.5 mg/dL (ref 0.3–1.2)
Total Protein: 7.1 g/dL (ref 6.5–8.1)

## 2021-06-23 LAB — LACTATE DEHYDROGENASE: LDH: 425 U/L — ABNORMAL HIGH (ref 98–192)

## 2021-06-23 MED ORDER — METHYLPREDNISOLONE SODIUM SUCC 125 MG IJ SOLR
125.0000 mg | Freq: Once | INTRAMUSCULAR | Status: AC
  Administered 2021-06-23: 125 mg via INTRAVENOUS
  Filled 2021-06-23: qty 2

## 2021-06-23 MED ORDER — DIPHENHYDRAMINE HCL 25 MG PO CAPS
50.0000 mg | ORAL_CAPSULE | Freq: Once | ORAL | Status: AC
  Administered 2021-06-23: 50 mg via ORAL
  Filled 2021-06-23: qty 2

## 2021-06-23 MED ORDER — FAMOTIDINE 20 MG IN NS 100 ML IVPB
20.0000 mg | Freq: Once | INTRAVENOUS | Status: AC
  Administered 2021-06-23: 20 mg via INTRAVENOUS
  Filled 2021-06-23: qty 100

## 2021-06-23 MED ORDER — SODIUM CHLORIDE 0.9 % IV SOLN: Freq: Once | INTRAVENOUS | Status: AC

## 2021-06-23 MED ORDER — LEVOTHYROXINE SODIUM 25 MCG PO TABS
ORAL_TABLET | ORAL | 3 refills | Status: DC
  Filled 2021-06-23: qty 90, 90d supply, fill #0
  Filled 2021-09-02: qty 90, 90d supply, fill #1
  Filled 2021-11-27: qty 90, 90d supply, fill #2
  Filled 2022-02-27: qty 90, 90d supply, fill #3

## 2021-06-23 MED ORDER — SODIUM CHLORIDE 0.9 % IV SOLN
375.0000 mg/m2 | Freq: Once | INTRAVENOUS | Status: AC
  Administered 2021-06-23: 800 mg via INTRAVENOUS
  Filled 2021-06-23: qty 50

## 2021-06-23 MED ORDER — ACETAMINOPHEN 325 MG PO TABS
650.0000 mg | ORAL_TABLET | Freq: Once | ORAL | Status: AC
  Administered 2021-06-23: 650 mg via ORAL
  Filled 2021-06-23: qty 2

## 2021-06-23 NOTE — Patient Instructions (Signed)
[facility]   Discharge Instructions: Thank you for choosing [facility] to provide your oncology and hematology care.   If you have a lab appointment with the [facility], please go directly to the [facility] and check in at the registration area.   Wear comfortable clothing and clothing appropriate for easy access to any Portacath or PICC line.   We strive to give you quality time with your provider. You may need to reschedule your appointment if you arrive late (15 or more minutes).  Arriving late affects you and other patients whose appointments are after yours.  Also, if you miss three or more appointments without notifying the office, you may be dismissed from the clinic at the provider's discretion.      For prescription refill requests, have your pharmacy contact our office and allow 72 hours for refills to be completed.    Today you received the following chemotherapy and/or immunotherapy agents: rituximab.      To help prevent nausea and vomiting after your treatment, we encourage you to take your nausea medication as directed.  BELOW ARE SYMPTOMS THAT SHOULD BE REPORTED IMMEDIATELY: *FEVER GREATER THAN 100.4 F (38 C) OR HIGHER *CHILLS OR SWEATING *NAUSEA AND VOMITING THAT IS NOT CONTROLLED WITH YOUR NAUSEA MEDICATION *UNUSUAL SHORTNESS OF BREATH *UNUSUAL BRUISING OR BLEEDING *URINARY PROBLEMS (pain or burning when urinating, or frequent urination) *BOWEL PROBLEMS (unusual diarrhea, constipation, pain near the anus) TENDERNESS IN MOUTH AND THROAT WITH OR WITHOUT PRESENCE OF ULCERS (sore throat, sores in mouth, or a toothache) UNUSUAL RASH, SWELLING OR PAIN  UNUSUAL VAGINAL DISCHARGE OR ITCHING   Items with * indicate a potential emergency and should be followed up as soon as possible or go to the Emergency Department if any problems should occur.  Please show the CHEMOTHERAPY ALERT CARD or IMMUNOTHERAPY ALERT CARD at check-in  to the Emergency Department and triage nurse.  Should you have questions after your visit or need to cancel or reschedule your appointment, please contact [facility]  Dept: [phone]  and follow the prompts.  Office hours are 8:00 a.m. to 4:30 p.m. Monday - Friday. Please note that voicemails left after 4:00 p.m. may not be returned until the following business day.  We are closed weekends and major holidays. You have access to a nurse at all times for urgent questions. Please call the main number to the clinic Dept: [phone] and follow the prompts.   For any non-urgent questions, you may also contact your provider using MyChart. We now offer e-Visits for anyone 72 and older to request care online for non-urgent symptoms. For details visit [email or web address].   Also download the MyChart app! Go to the app store, search "MyChart", open the app, select , and log in with your MyChart username and password.  Due to Covid, a mask is required upon entering the hospital/clinic. If you do not have a mask, one will be given to you upon arrival. For doctor visits, patients may have 1 support person aged 64 or older with them. For treatment visits, patients cannot have anyone with them due to current Covid guidelines and our immunocompromised population.

## 2021-06-24 ENCOUNTER — Encounter: Payer: Self-pay | Admitting: Hematology

## 2021-06-24 ENCOUNTER — Telehealth: Payer: Self-pay | Admitting: Hematology

## 2021-06-24 NOTE — Telephone Encounter (Signed)
Scheduled follow-up appointments per 10/3 los. Patient is aware. 

## 2021-06-26 ENCOUNTER — Other Ambulatory Visit: Payer: Self-pay

## 2021-06-26 DIAGNOSIS — C8318 Mantle cell lymphoma, lymph nodes of multiple sites: Secondary | ICD-10-CM

## 2021-06-29 ENCOUNTER — Encounter: Payer: Self-pay | Admitting: Hematology

## 2021-06-29 NOTE — Progress Notes (Signed)
HEMATOLOGY/ONCOLOGY CLINIC NOTE  Date of Service: .06/23/2021  Patient Care Team: Brian Arabian, MD as PCP - General (Family Medicine)  CHIEF COMPLAINTS/PURPOSE OF CONSULTATION:  Continue mx of mantle cell lymphoma  HISTORY OF PRESENTING ILLNESS:   Brian Kaiser is a wonderful 65 y.o. male who has been referred to [place] by Dr. Gaynelle Kaiser for evaluation and management of concern for lymphoma. Pt is accompanied today by his wife, [name]. The pt reports that he is doing well overall.    The pt reports that last November he had an Upper Endoscopy because he was having abdominal bloating. Pt was found to have gastritis and was given Protonix and Pepcid to use as needed. Pt has not used Protonix and Pepcid in two months. His abdominal bloating has been intermittent since November, but has been better recently.   Last July pt had surgery on his right forearm and was experiencing difficulty sleeping due to him continuously rolling on the arm. This lasted for some time. Pt had his second COVID19 vaccine in February and his second Shingles vaccine two weeks later. After this he began experiencing fatigue, which is still present. His fatigue is not progressive and he is able to complete his daily task. Pt has drenching night sweats that started two months ago. These do not occur every night. He has lost 15 lbs in the last few months without effort, but started back going to the gym in May. Pt has noticed that left inguinal lymph node has fluctuated in size since he was diagnosed with Hepatitis B nearly 20 years ago. Recently the lymph node has grown in size and become more firm. He has also noticed an increase in the size of a right cervical lymph node. Pt has had left testicular discomfort in the last few weeks. He reports b/l ankle swelling, calf cramping and upper left abdominal pain with certain sleeping positions.  His Hepatitis B was picked up after he was experiencing abdominal discomfort and was  treated with milk thistle. It has been monitored via labwork over the years. Pt had a Basal Cell lesion on his left thigh and back a few months ago. They have been removed.   Of note prior to the patient's visit today, pt has had CT C/A/P ([id number]) completed on 06/06/2020 with results revealing "Extensive adenopathy in the chest, abdomen and pelvis. Marked splenomegaly. Findings most concerning for lymphoma. Small amount of free fluid in the abdomen and pelvis."   Most recent lab results (06/03/2020) of CBC w/diff & CMP is as follows: all values are WNL except for PLT at 135K. 06/03/2020 TSH at 7.86 06/03/2020 PSA at 0.20 06/03/2020 Sed Rate at 1  On review of systems, pt reports night sweats, new lumps/bumps, ankle swelling, calf cramping, constipation, change in color of bowel movements, left testicular pain, fatigue, unexpected weight loss, improving abdominal bloating, early satiety and denies SOB, chest pain, fevers, chills, bloody/black stools, headaches, vision changes, bone pain, back pain, flank pain and any other symptoms.   On PMHx the pt reports GERD, Forearm laceration, Right Forearm Ligament Repair, Hepatitis B, Hyperlipidemia. On Family Hx the pt reports that his brother was diagnosed with DLBCL a year ago.  INTERVAL HISTORY:  Brian Kaiser is a wonderful 65 y.o. male who is here for evaluation and management of his Mantle Cell Lymphoma. The patient's last visit with [place] was on 02/21/2021.   The pt reports no new symptoms or concerns. He notes no new medication changes.  No  new lumps or bumps.  No fevers no chills no night sweats.  No new GI symptoms.  No other acute new focal symptoms..  Patient recently had his COVID-19 booster shot.  Lab results 06/23/2021 reviewed with the patient.  CBC shows WBC count of 3k with ANC of 1.6k normal hemoglobin and platelets. CMP within normal limits LDH elevated at 425 likely due to muscle inflammation from intramuscular COVID-19  vaccination.  MEDICAL HISTORY:  Past Medical History:  Diagnosis Date   Allergy    Forearm laceration    right forearm   GERD (gastroesophageal reflux disease)    Hepatitis B    Hyperlipidemia    Pulmonary lesion    Left pulmonary schwannoma   Schwannoma    left lower lung    SURGICAL HISTORY: Past Surgical History:  Procedure Laterality Date   COLONOSCOPY     LIGAMENT REPAIR Right 04/19/2019   Right forearm   LIPOMA EXCISION Left    neck   LUNG SURGERY Left    OTHER SURGICAL HISTORY Left    pulmonary schwannoma excision   UPPER GASTROINTESTINAL ENDOSCOPY     WOUND EXPLORATION Right 04/19/2019   Procedure: WOUND EXPLORATION; REPAIR RIGHT FOREARM LACERATION;  Surgeon: Charlotte Crumb, MD;  Location: [facility];  Service: Orthopedics;  Laterality: Right;  AXILLARY BLOCK AND MAC    SOCIAL HISTORY: Social History   Socioeconomic History   Marital status: Married    Spouse name: Not on file   Number of children: Not on file   Years of education: Not on file   Highest education level: Not on file  Occupational History   Not on file  Tobacco Use   Smoking status: Never   Smokeless tobacco: Never  Vaping Use   Vaping Use: Never used  Substance and Sexual Activity   Alcohol use: No    Alcohol/week: 0.0 standard drinks   Drug use: No   Sexual activity: Not on file  Other Topics Concern   Not on file  Social History Narrative   Not on file   Social Determinants of Health   Financial Resource Strain: Not on file  Food Insecurity: Not on file  Transportation Needs: Not on file  Physical Activity: Not on file  Stress: Not on file  Social Connections: Not on file  Intimate Partner Violence: Not on file    FAMILY HISTORY: Family History  Problem Relation Age of Onset   Macular degeneration Mother    Dementia Mother    Diverticulitis Mother    Prostate cancer Paternal Grandfather    Diverticulitis Brother    Colon cancer Neg Hx    Colon  polyps Neg Hx    Esophageal cancer Neg Hx    Liver cancer Neg Hx    Pancreatic cancer Neg Hx    Rectal cancer Neg Hx    Stomach cancer Neg Hx     ALLERGIES:  is allergic to augmentin [amoxicillin-pot clavulanate].  MEDICATIONS:  Current Outpatient Medications  Medication Sig Dispense Refill   acetaminophen (TYLENOL) 325 MG tablet Take 325 mg by mouth every 6 (six) hours as needed for moderate pain or headache.     allopurinol (ZYLOPRIM) 100 MG tablet TAKE 1 TABLET BY MOUTH TWO TIMES DAILY 60 tablet 0   cholecalciferol (VITAMIN D3) 25 MCG (1000 UNIT) tablet Take 1,000 Units by mouth daily.     COVID-19 At Home Antigen Test KIT USE AS DIRECTED WITHIN PACKAGE INSTRUCTIONS 4 kit 0   dexamethasone (DECADRON) 4  MG tablet TAKE 2 TABLETS BY MOUTH DAILY START DAY AFTER CHEMO 30 tablet 0   famotidine (PEPCID) 20 MG tablet Take 1 tablet (20 mg total) by mouth at bedtime. (Patient taking differently: Take 20 mg by mouth daily as needed for heartburn. ) 30 tablet 3   levothyroxine (SYNTHROID) 25 MCG tablet Take 1 tablet by mouth daily in the morning on an empty stomach 90 tablet 3   Multiple Vitamin (MULTIVITAMIN) tablet Take 1 tablet by mouth daily.     Omega-3 Fatty Acids (FISH OIL) 1000 MG CAPS Take 1,000 mg by mouth daily.     pantoprazole (PROTONIX) 40 MG tablet Take 1 tablet (40 mg total) by mouth daily. 90 tablet 3   vitamin C (ASCORBIC ACID) 250 MG tablet Take 250 mg by mouth daily.     Current Facility-Administered Medications  Medication Dose Route Frequency Provider Last Rate Last Admin   0.9 %  sodium chloride infusion   Intravenous PRN Eileen Stanford, PA-C        REVIEW OF SYSTEMS:   .10 Point review of Systems was done is negative except as noted above.   PHYSICAL EXAMINATION: ECOG PERFORMANCE STATUS: 1 - Symptomatic but completely ambulatory  VS stable . GENERAL:alert, in no acute distress and comfortable SKIN: no acute rashes, no significant lesions EYES: conjunctiva  are pink and non-injected, sclera anicteric OROPHARYNX: MMM, no exudates, no oropharyngeal erythema or ulceration NECK: supple, no JVD LYMPH:  no palpable lymphadenopathy in the cervical, axillary or inguinal regions LUNGS: clear to auscultation b/l with normal respiratory effort HEART: regular rate & rhythm ABDOMEN:  normoactive bowel sounds , non tender, not distended. Extremity: no pedal edema PSYCH: alert & oriented x 3 with fluent speech NEURO: no focal motor/sensory deficits   LABORATORY DATA:  I have reviewed the data as listed  . CBC Latest Ref Rng & Units 06/23/2021 04/22/2021 02/20/2021  WBC 4.0 - 10.5 K/uL 3.0(L) 3.0(L) 3.7(L)  Hemoglobin 13.0 - 17.0 g/dL 14.4 14.5 14.5  Hematocrit 39.0 - 52.0 % 40.3 40.7 41.8  Platelets 150 - 400 K/uL 180 153 184   ANC 800 . CMP Latest Ref Rng & Units 06/23/2021 04/22/2021 02/20/2021  Glucose 70 - 99 mg/dL 87 86 106(H)  BUN 8 - 23 mg/dL _0 Creatinine 0.61 - 1.24 mg/dL 1.14 1.07 1.25(H)  Sodium 135 - 145 mmol/L 142 141 141  Potassium 3.5 - 5.1 mmol/L 4.4 3.9 4.2  Chloride 98 - 111 mmol/L 108 109 105  CO2 22 - 32 mmol/L _1 Calcium 8.9 - 10.3 mg/dL 9.3 9.4 9.5  Total Protein 6.5 - 8.1 g/dL 7.1 6.9 7.3  Total Bilirubin 0.3 - 1.2 mg/dL 0.5 0.4 0.5  Alkaline Phos 38 - 126 U/L 92 91 74  AST 15 - 41 U/L 34 27 35  ALT 0 - 44 U/L _2 . Lab Results  Component Value Date   LDH 425 (H) 06/23/2021    06/20/2020 Right Axillary Surgical Pathology Report [id number]):    06/20/2020 Right Axillary Lymph Node Flow Pathology ([id number]):   06/11/2020 Flow Pathology ([id number]):   RADIOGRAPHIC STUDIES: I have personally reviewed the radiological images as listed and agreed with the findings in the report. No results found.  ASSESSMENT & PLAN:   65 yo with   1) Recently diagnosed stage IV mantle cell lymphoma with extensive lymphadenopathy and massive splenomegaly . 2) s/p massive splenomegaly likely related  to mantle cell lymphoma  3) thrombocytopenia-mild platelets of 132k likely related to lymphoma. 4) hepatitis B core antibody positive, surface antigen negative, HBV DNA PCR negative 5) elevated LDH due to lymphoma  PLAN: -Discussed pt labwork, Lab results 06/23/2021 reviewed with the patient.  CBC shows WBC count of 3k with ANC of 1.6k normal hemoglobin and platelets. CMP within normal limits LDH elevated at 425 likely due to muscle inflammation from intramuscular COVID-19 vaccination. -Recommended sun protection due to increased skin sensitivity while outside. -Continue maintenance Rituxan q33month. The pt has no prohibitive toxicities at this time. Will continue to monitor. -Continue same supportive medications -rpt LDH to monitor in 2 months -- no clinical symptoms suggestive of mantle cell lymphoma progression at this time. -Will see back in 2 months with labs.   FOLLOW UP: Plz schedule for next 2 doses of maintenance Rituxan q60 days with labs and MD visits  . The total time spent in the appointment was 30 minutes and more than 50% was on counseling and direct patient cares, ordering and management of Rituxan treatment.   All of the patient's questions were answered with apparent satisfaction. The patient knows to call the clinic with any problems, questions or concerns.    GSullivan LoneMD MMokelumne HillAAHIVMS SCass Lake HospitalCMarshall Medical CenterHematology/Oncology Physician [facility]

## 2021-07-16 DIAGNOSIS — Z23 Encounter for immunization: Secondary | ICD-10-CM | POA: Diagnosis not present

## 2021-07-16 DIAGNOSIS — E781 Pure hyperglyceridemia: Secondary | ICD-10-CM | POA: Diagnosis not present

## 2021-07-16 DIAGNOSIS — E039 Hypothyroidism, unspecified: Secondary | ICD-10-CM | POA: Diagnosis not present

## 2021-07-16 DIAGNOSIS — K219 Gastro-esophageal reflux disease without esophagitis: Secondary | ICD-10-CM | POA: Diagnosis not present

## 2021-07-17 ENCOUNTER — Other Ambulatory Visit: Payer: 59

## 2021-08-05 ENCOUNTER — Other Ambulatory Visit (HOSPITAL_COMMUNITY): Payer: Self-pay

## 2021-08-05 DIAGNOSIS — D485 Neoplasm of uncertain behavior of skin: Secondary | ICD-10-CM | POA: Diagnosis not present

## 2021-08-05 DIAGNOSIS — D2271 Melanocytic nevi of right lower limb, including hip: Secondary | ICD-10-CM | POA: Diagnosis not present

## 2021-08-05 DIAGNOSIS — D1801 Hemangioma of skin and subcutaneous tissue: Secondary | ICD-10-CM | POA: Diagnosis not present

## 2021-08-05 DIAGNOSIS — Z1283 Encounter for screening for malignant neoplasm of skin: Secondary | ICD-10-CM | POA: Diagnosis not present

## 2021-08-05 DIAGNOSIS — D225 Melanocytic nevi of trunk: Secondary | ICD-10-CM | POA: Diagnosis not present

## 2021-08-05 MED ORDER — MUPIROCIN 2 % EX OINT
TOPICAL_OINTMENT | CUTANEOUS | 99 refills | Status: DC
  Filled 2021-08-05: qty 22, 7d supply, fill #0

## 2021-08-19 ENCOUNTER — Other Ambulatory Visit: Payer: Self-pay | Admitting: Gastroenterology

## 2021-08-19 ENCOUNTER — Other Ambulatory Visit (HOSPITAL_COMMUNITY): Payer: Self-pay

## 2021-08-19 MED ORDER — PANTOPRAZOLE SODIUM 40 MG PO TBEC
40.0000 mg | DELAYED_RELEASE_TABLET | Freq: Every day | ORAL | 3 refills | Status: DC
  Filled 2021-08-19: qty 90, 90d supply, fill #0

## 2021-08-20 DIAGNOSIS — E039 Hypothyroidism, unspecified: Secondary | ICD-10-CM | POA: Diagnosis not present

## 2021-08-22 ENCOUNTER — Inpatient Hospital Stay (HOSPITAL_BASED_OUTPATIENT_CLINIC_OR_DEPARTMENT_OTHER): Payer: 59 | Admitting: Hematology

## 2021-08-22 ENCOUNTER — Inpatient Hospital Stay: Payer: 59

## 2021-08-22 ENCOUNTER — Other Ambulatory Visit: Payer: Self-pay

## 2021-08-22 ENCOUNTER — Inpatient Hospital Stay: Payer: 59 | Attending: Hematology

## 2021-08-22 VITALS — BP 158/93 | HR 62 | Temp 97.9°F | Resp 18

## 2021-08-22 VITALS — BP 179/105 | HR 61 | Temp 97.7°F | Resp 17 | Ht 69.0 in | Wt 193.0 lb

## 2021-08-22 DIAGNOSIS — Z5112 Encounter for antineoplastic immunotherapy: Secondary | ICD-10-CM | POA: Insufficient documentation

## 2021-08-22 DIAGNOSIS — C8318 Mantle cell lymphoma, lymph nodes of multiple sites: Secondary | ICD-10-CM

## 2021-08-22 DIAGNOSIS — C8314 Mantle cell lymphoma, lymph nodes of axilla and upper limb: Secondary | ICD-10-CM | POA: Diagnosis not present

## 2021-08-22 DIAGNOSIS — Z7189 Other specified counseling: Secondary | ICD-10-CM

## 2021-08-22 DIAGNOSIS — Z79899 Other long term (current) drug therapy: Secondary | ICD-10-CM | POA: Insufficient documentation

## 2021-08-22 LAB — CBC WITH DIFFERENTIAL (CANCER CENTER ONLY)
Abs Immature Granulocytes: 0 K/uL (ref 0.00–0.07)
Band Neutrophils: 6 %
Basophils Absolute: 0.1 K/uL (ref 0.0–0.1)
Basophils Relative: 2 %
Eosinophils Absolute: 0 K/uL (ref 0.0–0.5)
Eosinophils Relative: 0 %
HCT: 43.8 % (ref 39.0–52.0)
Hemoglobin: 15.5 g/dL (ref 13.0–17.0)
Lymphocytes Relative: 52 %
Lymphs Abs: 1.8 K/uL (ref 0.7–4.0)
MCH: 31.5 pg (ref 26.0–34.0)
MCHC: 35.4 g/dL (ref 30.0–36.0)
MCV: 89 fL (ref 80.0–100.0)
Monocytes Absolute: 0.2 K/uL (ref 0.1–1.0)
Monocytes Relative: 5 %
Neutro Abs: 1.4 K/uL — ABNORMAL LOW (ref 1.7–7.7)
Neutrophils Relative %: 35 %
Platelet Count: 189 K/uL (ref 150–400)
RBC: 4.92 MIL/uL (ref 4.22–5.81)
RDW: 12.3 % (ref 11.5–15.5)
Smear Review: NORMAL
WBC Count: 3.5 K/uL — ABNORMAL LOW (ref 4.0–10.5)
nRBC: 0 % (ref 0.0–0.2)

## 2021-08-22 LAB — CMP (CANCER CENTER ONLY)
ALT: 31 U/L (ref 0–44)
AST: 35 U/L (ref 15–41)
Albumin: 4.2 g/dL (ref 3.5–5.0)
Alkaline Phosphatase: 124 U/L (ref 38–126)
Anion gap: 10 (ref 5–15)
BUN: 17 mg/dL (ref 8–23)
CO2: 23 mmol/L (ref 22–32)
Calcium: 9.4 mg/dL (ref 8.9–10.3)
Chloride: 107 mmol/L (ref 98–111)
Creatinine: 1.07 mg/dL (ref 0.61–1.24)
GFR, Estimated: >60 mL/min (ref >60)
Glucose, Bld: 89 mg/dL (ref 70–99)
Potassium: 3.9 mmol/L (ref 3.5–5.1)
Sodium: 140 mmol/L (ref 135–145)
Total Bilirubin: 0.6 mg/dL (ref 0.3–1.2)
Total Protein: 7.6 g/dL (ref 6.5–8.1)

## 2021-08-22 LAB — LACTATE DEHYDROGENASE: LDH: 375 U/L — ABNORMAL HIGH (ref 98–192)

## 2021-08-22 MED ORDER — SODIUM CHLORIDE 0.9 % IV SOLN
375.0000 mg/m2 | Freq: Once | INTRAVENOUS | Status: AC
  Administered 2021-08-22: 800 mg via INTRAVENOUS
  Filled 2021-08-22: qty 50

## 2021-08-22 MED ORDER — SODIUM CHLORIDE 0.9 % IV SOLN: Freq: Once | INTRAVENOUS | Status: AC

## 2021-08-22 MED ORDER — ACETAMINOPHEN 325 MG PO TABS
650.0000 mg | ORAL_TABLET | Freq: Once | ORAL | Status: AC
  Administered 2021-08-22: 650 mg via ORAL
  Filled 2021-08-22: qty 2

## 2021-08-22 MED ORDER — DIPHENHYDRAMINE HCL 25 MG PO CAPS
50.0000 mg | ORAL_CAPSULE | Freq: Once | ORAL | Status: AC
  Administered 2021-08-22: 50 mg via ORAL
  Filled 2021-08-22: qty 2

## 2021-08-22 MED ORDER — FAMOTIDINE 20 MG IN NS 100 ML IVPB
20.0000 mg | Freq: Once | INTRAVENOUS | Status: AC
  Administered 2021-08-22: 20 mg via INTRAVENOUS
  Filled 2021-08-22: qty 100

## 2021-08-22 MED ORDER — METHYLPREDNISOLONE SODIUM SUCC 125 MG IJ SOLR
125.0000 mg | Freq: Once | INTRAMUSCULAR | Status: AC
  Administered 2021-08-22: 125 mg via INTRAVENOUS
  Filled 2021-08-22: qty 2

## 2021-08-22 NOTE — Patient Instructions (Signed)
[facility] MEDICAL ONCOLOGY  Discharge Instructions: Thank you for choosing [facility] to provide your oncology and hematology care.   If you have a lab appointment with the Cancer Center, please go directly to the Cancer Center and check in at the registration area.   Wear comfortable clothing and clothing appropriate for easy access to any Portacath or PICC line.   We strive to give you quality time with your provider. You may need to reschedule your appointment if you arrive late (15 or more minutes).  Arriving late affects you and other patients whose appointments are after yours.  Also, if you miss three or more appointments without notifying the office, you may be dismissed from the clinic at the provider's discretion.      For prescription refill requests, have your pharmacy contact our office and allow 72 hours for refills to be completed.    Today you received the following chemotherapy and/or immunotherapy agents rituxan      To help prevent nausea and vomiting after your treatment, we encourage you to take your nausea medication as directed.  BELOW ARE SYMPTOMS THAT SHOULD BE REPORTED IMMEDIATELY: *FEVER GREATER THAN 100.4 F (38 C) OR HIGHER *CHILLS OR SWEATING *NAUSEA AND VOMITING THAT IS NOT CONTROLLED WITH YOUR NAUSEA MEDICATION *UNUSUAL SHORTNESS OF BREATH *UNUSUAL BRUISING OR BLEEDING *URINARY PROBLEMS (pain or burning when urinating, or frequent urination) *BOWEL PROBLEMS (unusual diarrhea, constipation, pain near the anus) TENDERNESS IN MOUTH AND THROAT WITH OR WITHOUT PRESENCE OF ULCERS (sore throat, sores in mouth, or a toothache) UNUSUAL RASH, SWELLING OR PAIN  UNUSUAL VAGINAL DISCHARGE OR ITCHING   Items with * indicate a potential emergency and should be followed up as soon as possible or go to the Emergency Department if any problems should occur.  Please show the CHEMOTHERAPY ALERT CARD or IMMUNOTHERAPY ALERT CARD at check-in to the  Emergency Department and triage nurse.  Should you have questions after your visit or need to cancel or reschedule your appointment, please contact [facility] MEDICAL ONCOLOGY  Dept: [phone]  and follow the prompts.  Office hours are 8:00 a.m. to 4:30 p.m. Monday - Friday. Please note that voicemails left after 4:00 p.m. may not be returned until the following business day.  We are closed weekends and major holidays. You have access to a nurse at all times for urgent questions. Please call the main number to the clinic Dept: [phone] and follow the prompts.   For any non-urgent questions, you may also contact your provider using MyChart. We now offer e-Visits for anyone 18 and older to request care online for non-urgent symptoms. For details visit [email or web address].   Also download the MyChart app! Go to the app store, search "MyChart", open the app, select [facility], and log in with your MyChart username and password.  Due to Covid, a mask is required upon entering the hospital/clinic. If you do not have a mask, one will be given to you upon arrival. For doctor visits, patients may have 1 support person aged 18 or older with them. For treatment visits, patients cannot have anyone with them due to current Covid guidelines and our immunocompromised population.   

## 2021-08-25 ENCOUNTER — Telehealth: Payer: Self-pay | Admitting: Hematology

## 2021-08-25 NOTE — Telephone Encounter (Signed)
Scheduled follow-up appointments per 12/2 los. Patient is aware. 

## 2021-08-28 ENCOUNTER — Encounter: Payer: Self-pay | Admitting: Hematology

## 2021-08-28 NOTE — Progress Notes (Addendum)
HEMATOLOGY/ONCOLOGY CLINIC NOTE  Date of Service: .08/22/2021  Patient Care Team: Brian Arabian, MD as PCP - General (Family Medicine)  CHIEF COMPLAINTS/PURPOSE OF CONSULTATION:  Follow-up for continued evaluation and management of mantle cell lymphoma and next cycle of maintenance Rituxan immunotherapy.  HISTORY OF PRESENTING ILLNESS:   Brian Kaiser is a wonderful 65 y.o. male who has been referred to [place] by Dr. Gaynelle Kaiser for evaluation and management of concern for lymphoma. Pt is accompanied today by his wife, [name]. The pt reports that he is doing well overall.    The pt reports that last November he had an Upper Endoscopy because he was having abdominal bloating. Pt was found to have gastritis and was given Protonix and Pepcid to use as needed. Pt has not used Protonix and Pepcid in two months. His abdominal bloating has been intermittent since November, but has been better recently.   Last July pt had surgery on his right forearm and was experiencing difficulty sleeping due to him continuously rolling on the arm. This lasted for some time. Pt had his second COVID19 vaccine in February and his second Shingles vaccine two weeks later. After this he began experiencing fatigue, which is still present. His fatigue is not progressive and he is able to complete his daily task. Pt has drenching night sweats that started two months ago. These do not occur every night. He has lost 15 lbs in the last few months without effort, but started back going to the gym in May. Pt has noticed that left inguinal lymph node has fluctuated in size since he was diagnosed with Hepatitis B nearly 20 years ago. Recently the lymph node has grown in size and become more firm. He has also noticed an increase in the size of a right cervical lymph node. Pt has had left testicular discomfort in the last few weeks. He reports b/l ankle swelling, calf cramping and upper left abdominal pain with certain sleeping  positions.  His Hepatitis B was picked up after he was experiencing abdominal discomfort and was treated with milk thistle. It has been monitored via labwork over the years. Pt had a Basal Cell lesion on his left thigh and back a few months ago. They have been removed.   Of note prior to the patient's visit today, pt has had CT C/A/P ([id number]) completed on 06/06/2020 with results revealing "Extensive adenopathy in the chest, abdomen and pelvis. Marked splenomegaly. Findings most concerning for lymphoma. Small amount of free fluid in the abdomen and pelvis."   Most recent lab results (06/03/2020) of CBC w/diff & CMP is as follows: all values are WNL except for PLT at 135K. 06/03/2020 TSH at 7.86 06/03/2020 PSA at 0.20 06/03/2020 Sed Rate at 1  On review of systems, pt reports night sweats, new lumps/bumps, ankle swelling, calf cramping, constipation, change in color of bowel movements, left testicular pain, fatigue, unexpected weight loss, improving abdominal bloating, early satiety and denies SOB, chest pain, fevers, chills, bloody/black stools, headaches, vision changes, bone pain, back pain, flank pain and any other symptoms.   On PMHx the pt reports GERD, Forearm laceration, Right Forearm Ligament Repair, Hepatitis B, Hyperlipidemia. On Family Hx the pt reports that his brother was diagnosed with DLBCL a year ago.  INTERVAL HISTORY:  Brian Kaiser is a wonderful 65 y.o. male who is here for continued evaluation and management of mantle cell lymphoma and next cycle of maintenance Rituxan.  Patient notes no acute new symptoms or concerns. No  new medications. No fevers no chills no night sweats no new lumps or bumps. No new GI symptoms. He is up to speed with his flu shot and COVID-19 vaccinations.  No reported toxicities from his last dose of Rituxan. Labs today were reviewed with the patient.  MEDICAL HISTORY:  Past Medical History:  Diagnosis Date   Allergy    Forearm laceration     right forearm   GERD (gastroesophageal reflux disease)    Hepatitis B    Hyperlipidemia    Pulmonary lesion    Left pulmonary schwannoma   Schwannoma    left lower lung    SURGICAL HISTORY: Past Surgical History:  Procedure Laterality Date   COLONOSCOPY     LIGAMENT REPAIR Right 04/19/2019   Right forearm   LIPOMA EXCISION Left    neck   LUNG SURGERY Left    OTHER SURGICAL HISTORY Left    pulmonary schwannoma excision   UPPER GASTROINTESTINAL ENDOSCOPY     WOUND EXPLORATION Right 04/19/2019   Procedure: WOUND EXPLORATION; REPAIR RIGHT FOREARM LACERATION;  Surgeon: Charlotte Crumb, MD;  Location: [facility];  Service: Orthopedics;  Laterality: Right;  AXILLARY BLOCK AND MAC    SOCIAL HISTORY: Social History   Socioeconomic History   Marital status: Married    Spouse name: Not on file   Number of children: Not on file   Years of education: Not on file   Highest education level: Not on file  Occupational History   Not on file  Tobacco Use   Smoking status: Never   Smokeless tobacco: Never  Vaping Use   Vaping Use: Never used  Substance and Sexual Activity   Alcohol use: No    Alcohol/week: 0.0 standard drinks   Drug use: No   Sexual activity: Not on file  Other Topics Concern   Not on file  Social History Narrative   Not on file   Social Determinants of Health   Financial Resource Strain: Not on file  Food Insecurity: Not on file  Transportation Needs: Not on file  Physical Activity: Not on file  Stress: Not on file  Social Connections: Not on file  Intimate Partner Violence: Not on file    FAMILY HISTORY: Family History  Problem Relation Age of Onset   Macular degeneration Mother    Dementia Mother    Diverticulitis Mother    Prostate cancer Paternal Grandfather    Diverticulitis Brother    Colon cancer Neg Hx    Colon polyps Neg Hx    Esophageal cancer Neg Hx    Liver cancer Neg Hx    Pancreatic cancer Neg Hx    Rectal  cancer Neg Hx    Stomach cancer Neg Hx     ALLERGIES:  is allergic to augmentin [amoxicillin-pot clavulanate].  MEDICATIONS:  Current Outpatient Medications  Medication Sig Dispense Refill   cholecalciferol (VITAMIN D3) 25 MCG (1000 UNIT) tablet Take 1,000 Units by mouth daily.     levothyroxine (SYNTHROID) 25 MCG tablet Take 1 tablet by mouth daily in the morning on an empty stomach 90 tablet 3   Multiple Vitamin (MULTIVITAMIN) tablet Take 1 tablet by mouth daily.     Omega-3 Fatty Acids (FISH OIL) 1000 MG CAPS Take 1,000 mg by mouth daily.     pantoprazole (PROTONIX) 40 MG tablet Take 1 tablet (40 mg total) by mouth daily. 90 tablet 3   vitamin C (ASCORBIC ACID) 250 MG tablet Take 250 mg by mouth daily.  acetaminophen (TYLENOL) 325 MG tablet Take 325 mg by mouth every 6 (six) hours as needed for moderate pain or headache.     allopurinol (ZYLOPRIM) 100 MG tablet TAKE 1 TABLET BY MOUTH TWO TIMES DAILY 60 tablet 0   COVID-19 At Home Antigen Test KIT USE AS DIRECTED WITHIN PACKAGE INSTRUCTIONS 4 kit 0   famotidine (PEPCID) 20 MG tablet Take 1 tablet (20 mg total) by mouth at bedtime. (Patient not taking: Reported on 08/22/2021) 30 tablet 3   mupirocin ointment (BACTROBAN) 2 % Apply to affected area up to 3 times a day as needed (Patient not taking: Reported on 08/22/2021) 22 g PRN   Current Facility-Administered Medications  Medication Dose Route Frequency Provider Last Rate Last Admin   0.9 %  sodium chloride infusion   Intravenous PRN Eileen Stanford, PA-C        REVIEW OF SYSTEMS:   .10 Point review of Systems was done is negative except as noted above.   PHYSICAL EXAMINATION: ECOG PERFORMANCE STATUS: 1 - Symptomatic but completely ambulatory  .BP (!) 179/105 (BP Location: Left Arm, Patient Position: Sitting) Comment: nurse notified  Pulse 61   Temp 97.7 F (36.5 C) (Temporal)   Resp 17   Ht _0  (1.753 m)   Wt 193 lb (87.5 kg)   SpO2 100%   BMI 28.50 kg/m    GENERAL:alert, in no acute distress and comfortable SKIN: no acute rashes, no significant lesions EYES: conjunctiva are pink and non-injected, sclera anicteric OROPHARYNX: MMM, no exudates, no oropharyngeal erythema or ulceration NECK: supple, no JVD LYMPH:  no palpable lymphadenopathy in the cervical, axillary or inguinal regions LUNGS: clear to auscultation b/l with normal respiratory effort HEART: regular rate & rhythm ABDOMEN:  normoactive bowel sounds , non tender, not distended. Extremity: no pedal edema PSYCH: alert & oriented x 3 with fluent speech NEURO: no focal motor/sensory deficits   LABORATORY DATA:  I have reviewed the data as listed  . CBC Latest Ref Rng & Units 08/22/2021 06/23/2021 04/22/2021  WBC 4.0 - 10.5 K/uL 3.5(L) 3.0(L) 3.0(L)  Hemoglobin 13.0 - 17.0 g/dL 15.5 14.4 14.5  Hematocrit 39.0 - 52.0 % 43.8 40.3 40.7  Platelets 150 - 400 K/uL 189 180 153   ANC 800 . CMP Latest Ref Rng & Units 08/22/2021 06/23/2021 04/22/2021  Glucose 70 - 99 mg/dL 89 87 86  BUN 8 - 23 mg/dL _1 Creatinine 0.61 - 1.24 mg/dL 1.07 1.14 1.07  Sodium 135 - 145 mmol/L 140 142 141  Potassium 3.5 - 5.1 mmol/L 3.9 4.4 3.9  Chloride 98 - 111 mmol/L 107 108 109  CO2 22 - 32 mmol/L _2 Calcium 8.9 - 10.3 mg/dL 9.4 9.3 9.4  Total Protein 6.5 - 8.1 g/dL 7.6 7.1 6.9  Total Bilirubin 0.3 - 1.2 mg/dL 0.6 0.5 0.4  Alkaline Phos 38 - 126 U/L 124 92 91  AST 15 - 41 U/L 35 34 27  ALT 0 - 44 U/L _3 . Lab Results  Component Value Date   LDH 375 (H) 08/22/2021    06/20/2020 Right Axillary Surgical Pathology Report [id number]):    06/20/2020 Right Axillary Lymph Node Flow Pathology ([id number]):   06/11/2020 Flow Pathology ([id number]):   RADIOGRAPHIC STUDIES: I have personally reviewed the radiological images as listed and agreed with the findings in the report. No results found.  ASSESSMENT & PLAN:   65 yo with   1) Recently diagnosed stage IV  mantle cell lymphoma with extensive lymphadenopathy and massive splenomegaly . 2) s/p massive splenomegaly likely related to mantle cell lymphoma  3) thrombocytopenia-mild platelets of 132k likely related to lymphoma. 4) hepatitis B core antibody positive, surface antigen negative, HBV DNA PCR negative 5) elevated LDH due to lymphoma  PLAN: -Discussed pt labwork, CBC and CMP unremarkable LDH still elevated at 375 but lower than last time.  Does not seem to be correlating with his lymphoma. -Patient has no clinical evidence of lymphoma progression at this time. -We discussed consideration of repeat imaging due to elevated LDH but that there is no strong indication since he has absolutely no new symptoms or progression. -Reports no toxicities from previous dose of Rituxan. -Rituxan orders placed would continue same supportive medications. -Continue to follow-up with primary care physician to stay up to speed with age-appropriate vaccinations and cancer screening.   FOLLOW UP: Plz schedule for next 3 doses of maintenance Rituxan q60 days with labs and MD visits  . The total time spent in the appointment was 31 minutes spent on reviewing labs, toxicity check ordering and management of Rituxan immunotherapy.  All of the patient's questions were answered with apparent satisfaction. The patient knows to call the clinic with any problems, questions or concerns.    Sullivan Lone MD [place] AAHIVMS [facility] Hematology/Oncology Physician [facility]

## 2021-09-02 ENCOUNTER — Other Ambulatory Visit (HOSPITAL_COMMUNITY): Payer: Self-pay

## 2021-09-05 ENCOUNTER — Other Ambulatory Visit (HOSPITAL_COMMUNITY): Payer: Self-pay

## 2021-10-21 ENCOUNTER — Inpatient Hospital Stay: Payer: 59 | Attending: Hematology | Admitting: Hematology

## 2021-10-21 ENCOUNTER — Inpatient Hospital Stay: Payer: 59

## 2021-10-21 ENCOUNTER — Other Ambulatory Visit: Payer: Self-pay

## 2021-10-21 ENCOUNTER — Other Ambulatory Visit: Payer: Self-pay | Admitting: Hematology

## 2021-10-21 VITALS — BP 146/95 | HR 67 | Temp 98.5°F | Resp 17 | Wt 195.5 lb

## 2021-10-21 DIAGNOSIS — Z79899 Other long term (current) drug therapy: Secondary | ICD-10-CM | POA: Insufficient documentation

## 2021-10-21 DIAGNOSIS — D702 Other drug-induced agranulocytosis: Secondary | ICD-10-CM | POA: Diagnosis not present

## 2021-10-21 DIAGNOSIS — C8318 Mantle cell lymphoma, lymph nodes of multiple sites: Secondary | ICD-10-CM | POA: Diagnosis not present

## 2021-10-21 DIAGNOSIS — C8314 Mantle cell lymphoma, lymph nodes of axilla and upper limb: Secondary | ICD-10-CM | POA: Insufficient documentation

## 2021-10-21 DIAGNOSIS — Z5112 Encounter for antineoplastic immunotherapy: Secondary | ICD-10-CM | POA: Diagnosis not present

## 2021-10-21 DIAGNOSIS — Z7189 Other specified counseling: Secondary | ICD-10-CM

## 2021-10-21 LAB — CBC WITH DIFFERENTIAL (CANCER CENTER ONLY)
Abs Immature Granulocytes: 0.05 10*3/uL (ref 0.00–0.07)
Basophils Absolute: 0.1 10*3/uL (ref 0.0–0.1)
Basophils Relative: 3 %
Eosinophils Absolute: 0.1 10*3/uL (ref 0.0–0.5)
Eosinophils Relative: 3 %
HCT: 41.3 % (ref 39.0–52.0)
Hemoglobin: 14.6 g/dL (ref 13.0–17.0)
Immature Granulocytes: 3 %
Lymphocytes Relative: 43 %
Lymphs Abs: 0.9 10*3/uL (ref 0.7–4.0)
MCH: 31.2 pg (ref 26.0–34.0)
MCHC: 35.4 g/dL (ref 30.0–36.0)
MCV: 88.2 fL (ref 80.0–100.0)
Monocytes Absolute: 0.3 10*3/uL (ref 0.1–1.0)
Monocytes Relative: 16 %
Neutro Abs: 0.7 10*3/uL — ABNORMAL LOW (ref 1.7–7.7)
Neutrophils Relative %: 32 %
Platelet Count: 197 10*3/uL (ref 150–400)
RBC: 4.68 MIL/uL (ref 4.22–5.81)
RDW: 12.4 % (ref 11.5–15.5)
WBC Count: 2 10*3/uL — ABNORMAL LOW (ref 4.0–10.5)
nRBC: 0 % (ref 0.0–0.2)

## 2021-10-21 LAB — CMP (CANCER CENTER ONLY)
ALT: 26 U/L (ref 0–44)
AST: 25 U/L (ref 15–41)
Albumin: 4.5 g/dL (ref 3.5–5.0)
Alkaline Phosphatase: 85 U/L (ref 38–126)
Anion gap: 6 (ref 5–15)
BUN: 14 mg/dL (ref 8–23)
CO2: 26 mmol/L (ref 22–32)
Calcium: 9.5 mg/dL (ref 8.9–10.3)
Chloride: 109 mmol/L (ref 98–111)
Creatinine: 1.11 mg/dL (ref 0.61–1.24)
GFR, Estimated: >60 mL/min (ref >60)
Glucose, Bld: 100 mg/dL — ABNORMAL HIGH (ref 70–99)
Potassium: 3.6 mmol/L (ref 3.5–5.1)
Sodium: 141 mmol/L (ref 135–145)
Total Bilirubin: 0.5 mg/dL (ref 0.3–1.2)
Total Protein: 6.9 g/dL (ref 6.5–8.1)

## 2021-10-21 LAB — LACTATE DEHYDROGENASE: LDH: 248 U/L — ABNORMAL HIGH (ref 98–192)

## 2021-10-21 MED ORDER — METHYLPREDNISOLONE SODIUM SUCC 125 MG IJ SOLR
125.0000 mg | Freq: Once | INTRAMUSCULAR | Status: AC
  Administered 2021-10-21: 125 mg via INTRAVENOUS
  Filled 2021-10-21: qty 2

## 2021-10-21 MED ORDER — ACETAMINOPHEN 325 MG PO TABS
650.0000 mg | ORAL_TABLET | Freq: Once | ORAL | Status: AC
  Administered 2021-10-21: 650 mg via ORAL
  Filled 2021-10-21: qty 2

## 2021-10-21 MED ORDER — SODIUM CHLORIDE 0.9 % IV SOLN: Freq: Once | INTRAVENOUS | Status: AC

## 2021-10-21 MED ORDER — DIPHENHYDRAMINE HCL 25 MG PO CAPS
50.0000 mg | ORAL_CAPSULE | Freq: Once | ORAL | Status: AC
  Administered 2021-10-21: 50 mg via ORAL
  Filled 2021-10-21: qty 2

## 2021-10-21 MED ORDER — FAMOTIDINE IN NACL 20-0.9 MG/50ML-% IV SOLN
20.0000 mg | Freq: Once | INTRAVENOUS | Status: AC
  Administered 2021-10-21: 20 mg via INTRAVENOUS
  Filled 2021-10-21: qty 50

## 2021-10-21 MED ORDER — SODIUM CHLORIDE 0.9 % IV SOLN
375.0000 mg/m2 | Freq: Once | INTRAVENOUS | Status: AC
  Administered 2021-10-21: 800 mg via INTRAVENOUS
  Filled 2021-10-21: qty 50

## 2021-10-21 NOTE — Patient Instructions (Signed)
[facility] MEDICAL ONCOLOGY  Discharge Instructions: Thank you for choosing [facility] to provide your oncology and hematology care.   If you have a lab appointment with the Cancer Center, please go directly to the Cancer Center and check in at the registration area.   Wear comfortable clothing and clothing appropriate for easy access to any Portacath or PICC line.   We strive to give you quality time with your provider. You may need to reschedule your appointment if you arrive late (15 or more minutes).  Arriving late affects you and other patients whose appointments are after yours.  Also, if you miss three or more appointments without notifying the office, you may be dismissed from the clinic at the provider's discretion.      For prescription refill requests, have your pharmacy contact our office and allow 72 hours for refills to be completed.    Today you received the following chemotherapy and/or immunotherapy agents rituxan      To help prevent nausea and vomiting after your treatment, we encourage you to take your nausea medication as directed.  BELOW ARE SYMPTOMS THAT SHOULD BE REPORTED IMMEDIATELY: *FEVER GREATER THAN 100.4 F (38 C) OR HIGHER *CHILLS OR SWEATING *NAUSEA AND VOMITING THAT IS NOT CONTROLLED WITH YOUR NAUSEA MEDICATION *UNUSUAL SHORTNESS OF BREATH *UNUSUAL BRUISING OR BLEEDING *URINARY PROBLEMS (pain or burning when urinating, or frequent urination) *BOWEL PROBLEMS (unusual diarrhea, constipation, pain near the anus) TENDERNESS IN MOUTH AND THROAT WITH OR WITHOUT PRESENCE OF ULCERS (sore throat, sores in mouth, or a toothache) UNUSUAL RASH, SWELLING OR PAIN  UNUSUAL VAGINAL DISCHARGE OR ITCHING   Items with * indicate a potential emergency and should be followed up as soon as possible or go to the Emergency Department if any problems should occur.  Please show the CHEMOTHERAPY ALERT CARD or IMMUNOTHERAPY ALERT CARD at check-in to the  Emergency Department and triage nurse.  Should you have questions after your visit or need to cancel or reschedule your appointment, please contact [facility] MEDICAL ONCOLOGY  Dept: [phone]  and follow the prompts.  Office hours are 8:00 a.m. to 4:30 p.m. Monday - Friday. Please note that voicemails left after 4:00 p.m. may not be returned until the following business day.  We are closed weekends and major holidays. You have access to a nurse at all times for urgent questions. Please call the main number to the clinic Dept: [phone] and follow the prompts.   For any non-urgent questions, you may also contact your provider using MyChart. We now offer e-Visits for anyone 18 and older to request care online for non-urgent symptoms. For details visit [email or web address].   Also download the MyChart app! Go to the app store, search "MyChart", open the app, select [facility], and log in with your MyChart username and password.  Due to Covid, a mask is required upon entering the hospital/clinic. If you do not have a mask, one will be given to you upon arrival. For doctor visits, patients may have 1 support person aged 18 or older with them. For treatment visits, patients cannot have anyone with them due to current Covid guidelines and our immunocompromised population.   

## 2021-10-22 ENCOUNTER — Other Ambulatory Visit: Payer: Self-pay

## 2021-10-22 ENCOUNTER — Telehealth: Payer: Self-pay | Admitting: Hematology

## 2021-10-22 DIAGNOSIS — C8318 Mantle cell lymphoma, lymph nodes of multiple sites: Secondary | ICD-10-CM

## 2021-10-22 NOTE — Telephone Encounter (Signed)
Scheduled follow-up appointment per 1/31 los. Patient is aware.

## 2021-10-27 ENCOUNTER — Encounter: Payer: Self-pay | Admitting: Hematology

## 2021-10-27 NOTE — Progress Notes (Signed)
° ° °HEMATOLOGY/ONCOLOGY CLINIC NOTE ° °Date of Service: .10/21/2021 ° °Patient Care Team: °Ehinger, Robert, MD as PCP - General (Family Medicine) ° °CHIEF COMPLAINTS/PURPOSE OF CONSULTATION:  °Follow-up for continued evaluation and management of mantle cell lymphoma and neck cycle of maintenance Rituxan ° °HISTORY OF PRESENTING ILLNESS:  ° °Please see previous note for details on initial presentation. ° °INTERVAL HISTORY: ° °Brian Kaiser is is here for follow-up for continued evaluation and management of his mantle cell lymphoma and continuing maintenance therapy with Rituxan. °He notes no acute toxicities from his previous dose of Rituxan. °Currently has no acute new focal symptoms. °No fevers no chills no night sweats no unexpected weight loss. °No new lumps or bumps. °Did have a URI several weeks ago which has now resolved. °Labs today show normal hemoglobin of 14.6, normal platelets of 197k, noted to have WBC count of 2k with an ANC of 700. °CMP unremarkable °LDH lower at 248 ° °No other acute new symptoms. ° °MEDICAL HISTORY:  °Past Medical History:  °Diagnosis Date  ° Allergy   ° Forearm laceration   ° right forearm  ° GERD (gastroesophageal reflux disease)   ° Hepatitis B   ° Hyperlipidemia   ° Pulmonary lesion   ° Left pulmonary schwannoma  ° Schwannoma   ° left lower lung  ° ° °SURGICAL HISTORY: °Past Surgical History:  °Procedure Laterality Date  ° COLONOSCOPY    ° LIGAMENT REPAIR Right 04/19/2019  ° Right forearm  ° LIPOMA EXCISION Left   ° neck  ° LUNG SURGERY Left   ° OTHER SURGICAL HISTORY Left   ° pulmonary schwannoma excision  ° UPPER GASTROINTESTINAL ENDOSCOPY    ° WOUND EXPLORATION Right 04/19/2019  ° Procedure: WOUND EXPLORATION; REPAIR RIGHT FOREARM LACERATION;  Surgeon: Weingold, Matthew, MD;  Location: [facility];  Service: Orthopedics;  Laterality: Right;  AXILLARY BLOCK AND MAC  ° ° °SOCIAL HISTORY: °Social History  ° °Socioeconomic History  ° Marital status: Married  °   Spouse name: Not on file  ° Number of children: Not on file  ° Years of education: Not on file  ° Highest education level: Not on file  °Occupational History  ° Not on file  °Tobacco Use  ° Smoking status: Never  ° Smokeless tobacco: Never  °Vaping Use  ° Vaping Use: Never used  °Substance and Sexual Activity  ° Alcohol use: No  °  Alcohol/week: 0.0 standard drinks  ° Drug use: No  ° Sexual activity: Not on file  °Other Topics Concern  ° Not on file  °Social History Narrative  ° Not on file  ° °Social Determinants of Health  ° °Financial Resource Strain: Not on file  °Food Insecurity: Not on file  °Transportation Needs: Not on file  °Physical Activity: Not on file  °Stress: Not on file  °Social Connections: Not on file  °Intimate Partner Violence: Not on file  ° ° °FAMILY HISTORY: °Family History  °Problem Relation Age of Onset  ° Macular degeneration Mother   ° Dementia Mother   ° Diverticulitis Mother   ° Prostate cancer Paternal Grandfather   ° Diverticulitis Brother   ° Colon cancer Neg Hx   ° Colon polyps Neg Hx   ° Esophageal cancer Neg Hx   ° Liver cancer Neg Hx   ° Pancreatic cancer Neg Hx   ° Rectal cancer Neg Hx   ° Stomach cancer Neg Hx   ° ° °ALLERGIES:  is allergic to augmentin [amoxicillin-pot   amoxicillin-pot clavulanate].  MEDICATIONS:  Current Outpatient Medications  Medication Sig Dispense Refill   acetaminophen (TYLENOL) 325 MG tablet Take 325 mg by mouth every 6 (six) hours as needed for moderate pain or headache.     allopurinol (ZYLOPRIM) 100 MG tablet TAKE 1 TABLET BY MOUTH TWO TIMES DAILY 60 tablet 0   cholecalciferol (VITAMIN D3) 25 MCG (1000 UNIT) tablet Take 1,000 Units by mouth daily.     COVID-19 At Home Antigen Test KIT USE AS DIRECTED WITHIN PACKAGE INSTRUCTIONS 4 kit 0   famotidine (PEPCID) 20 MG tablet Take 1 tablet (20 mg total) by mouth at bedtime. (Patient not taking: Reported on 08/22/2021) 30 tablet 3   levothyroxine (SYNTHROID) 25 MCG tablet Take 1 tablet by mouth daily in the morning  on an empty stomach 90 tablet 3   Multiple Vitamin (MULTIVITAMIN) tablet Take 1 tablet by mouth daily.     mupirocin ointment (BACTROBAN) 2 % Apply to affected area up to 3 times a day as needed (Patient not taking: Reported on 08/22/2021) 22 g PRN   Omega-3 Fatty Acids (FISH OIL) 1000 MG CAPS Take 1,000 mg by mouth daily.     pantoprazole (PROTONIX) 40 MG tablet Take 1 tablet (40 mg total) by mouth daily. 90 tablet 3   vitamin C (ASCORBIC ACID) 250 MG tablet Take 250 mg by mouth daily.     Current Facility-Administered Medications  Medication Dose Route Frequency Provider Last Rate Last Admin   0.9 %  sodium chloride infusion   Intravenous PRN Eileen Stanford, PA-C        REVIEW OF SYSTEMS:   .10 Point review of Systems was done is negative except as noted above.  PHYSICAL EXAMINATION: ECOG PERFORMANCE STATUS: 1 - Symptomatic but completely ambulatory Vital signs reviewed . GENERAL:alert, in no acute distress and comfortable SKIN: no acute rashes, no significant lesions EYES: conjunctiva are pink and non-injected, sclera anicteric OROPHARYNX: MMM, no exudates, no oropharyngeal erythema or ulceration NECK: supple, no JVD LYMPH:  no palpable lymphadenopathy in the cervical, axillary or inguinal regions LUNGS: clear to auscultation b/l with normal respiratory effort HEART: regular rate & rhythm ABDOMEN:  normoactive bowel sounds , non tender, not distended. Extremity: no pedal edema PSYCH: alert & oriented x 3 with fluent speech NEURO: no focal motor/sensory deficits   LABORATORY DATA:  I have reviewed the data as listed  . CBC Latest Ref Rng & Units 10/21/2021 08/22/2021 06/23/2021  WBC 4.0 - 10.5 K/uL 2.0(L) 3.5(L) 3.0(L)  Hemoglobin 13.0 - 17.0 g/dL 14.6 15.5 14.4  Hematocrit 39.0 - 52.0 % 41.3 43.8 40.3  Platelets 150 - 400 K/uL 197 189 180   ANC 700 . CMP Latest Ref Rng & Units 10/21/2021 08/22/2021 06/23/2021  Glucose 70 - 99 mg/dL 100(H) 89 87  BUN 8 - 23 mg/dL _0 Creatinine 0.61 - 1.24 mg/dL 1.11 1.07 1.14  Sodium 135 - 145 mmol/L 141 140 142  Potassium 3.5 - 5.1 mmol/L 3.6 3.9 4.4  Chloride 98 - 111 mmol/L 109 107 108  CO2 22 - 32 mmol/L _1 Calcium 8.9 - 10.3 mg/dL 9.5 9.4 9.3  Total Protein 6.5 - 8.1 g/dL 6.9 7.6 7.1  Total Bilirubin 0.3 - 1.2 mg/dL 0.5 0.6 0.5  Alkaline Phos 38 - 126 U/L 85 124 92  AST 15 - 41 U/L 25 35 34  ALT 0 - 44 U/L _2 . Lab Results  Component Value  Date   LDH 248 (H) 10/21/2021    06/20/2020 Right Axillary Surgical Pathology Report [id number]):    06/20/2020 Right Axillary Lymph Node Flow Pathology ([id number]):   06/11/2020 Flow Pathology [id number]):   RADIOGRAPHIC STUDIES: I have personally reviewed the radiological images as listed and agreed with the findings in the report. No results found.  ASSESSMENT & PLAN:   66 yo with   1) Recently diagnosed stage IV mantle cell lymphoma with extensive lymphadenopathy and massive splenomegaly . 2) s/p massive splenomegaly likely related to mantle cell lymphoma  3) thrombocytopenia-mild platelets of 132k likely related to lymphoma. 4) hepatitis B core antibody positive, surface antigen negative, HBV DNA PCR negative 5) Neutropenia - intermitent fluctuating-- recent URI, could be immunologic changes from Rituxan use. No fevers.  PLAN: -Patient's labs done today were reviewed in detail.  His hemoglobin and platelets are within normal limits and he has leukopenia of 2k with an ANC of 700 -LDH is down to 248 down from a previous high of 425.  Does not seem to be correlating with the patient's lymphoma. Discussed that his neutropenia could be related to his resolved URI or could be from the Rituxan.  He currently has no signs of infection.  We will continue Rituxan with the same supportive medications and we will recheck his blood counts in 2 to 3 weeks.  If worsening neutropenia might need to consider G-CSF -No lab findings or  clinical symptom suggestive of lymphoma recurrence/progression at this time. -Continue follow-up with primary care physician for management of other medical comorbidities.  FOLLOW UP: Labs in 2 weeks Please schedule next cycle of maintenance Rituxan in 2 months with port flush labs and MD visit   All of the patient's questions were answered with apparent satisfaction. The patient knows to call the clinic with any problems, questions or concerns.    Sullivan Lone MD [place] AAHIVMS [facility] Hematology/Oncology Physician [facility]

## 2021-11-03 ENCOUNTER — Other Ambulatory Visit (HOSPITAL_COMMUNITY): Payer: Self-pay

## 2021-11-03 ENCOUNTER — Other Ambulatory Visit: Payer: Self-pay | Admitting: Gastroenterology

## 2021-11-04 ENCOUNTER — Other Ambulatory Visit (HOSPITAL_COMMUNITY): Payer: Self-pay

## 2021-11-04 ENCOUNTER — Inpatient Hospital Stay: Payer: 59 | Attending: Hematology

## 2021-11-04 ENCOUNTER — Other Ambulatory Visit: Payer: Self-pay

## 2021-11-04 DIAGNOSIS — C8314 Mantle cell lymphoma, lymph nodes of axilla and upper limb: Secondary | ICD-10-CM | POA: Diagnosis not present

## 2021-11-04 DIAGNOSIS — C8318 Mantle cell lymphoma, lymph nodes of multiple sites: Secondary | ICD-10-CM

## 2021-11-04 LAB — CMP (CANCER CENTER ONLY)
ALT: 27 U/L (ref 0–44)
AST: 28 U/L (ref 15–41)
Albumin: 4.2 g/dL (ref 3.5–5.0)
Alkaline Phosphatase: 103 U/L (ref 38–126)
Anion gap: 6 (ref 5–15)
BUN: 19 mg/dL (ref 8–23)
CO2: 25 mmol/L (ref 22–32)
Calcium: 9.4 mg/dL (ref 8.9–10.3)
Chloride: 108 mmol/L (ref 98–111)
Creatinine: 1 mg/dL (ref 0.61–1.24)
GFR, Estimated: >60 mL/min (ref >60)
Glucose, Bld: 100 mg/dL — ABNORMAL HIGH (ref 70–99)
Potassium: 3.8 mmol/L (ref 3.5–5.1)
Sodium: 139 mmol/L (ref 135–145)
Total Bilirubin: 0.3 mg/dL (ref 0.3–1.2)
Total Protein: 6.8 g/dL (ref 6.5–8.1)

## 2021-11-04 LAB — CBC WITH DIFFERENTIAL (CANCER CENTER ONLY)
Abs Immature Granulocytes: 0.05 10*3/uL (ref 0.00–0.07)
Basophils Absolute: 0.1 10*3/uL (ref 0.0–0.1)
Basophils Relative: 2 %
Eosinophils Absolute: 0.1 10*3/uL (ref 0.0–0.5)
Eosinophils Relative: 3 %
HCT: 41.2 % (ref 39.0–52.0)
Hemoglobin: 14.8 g/dL (ref 13.0–17.0)
Immature Granulocytes: 2 %
Lymphocytes Relative: 46 %
Lymphs Abs: 1 10*3/uL (ref 0.7–4.0)
MCH: 31.4 pg (ref 26.0–34.0)
MCHC: 35.9 g/dL (ref 30.0–36.0)
MCV: 87.3 fL (ref 80.0–100.0)
Monocytes Absolute: 0.4 10*3/uL (ref 0.1–1.0)
Monocytes Relative: 19 %
Neutro Abs: 0.6 10*3/uL — ABNORMAL LOW (ref 1.7–7.7)
Neutrophils Relative %: 28 %
Platelet Count: 208 10*3/uL (ref 150–400)
RBC: 4.72 MIL/uL (ref 4.22–5.81)
RDW: 11.9 % (ref 11.5–15.5)
WBC Count: 2.2 10*3/uL — ABNORMAL LOW (ref 4.0–10.5)
nRBC: 0 % (ref 0.0–0.2)

## 2021-11-04 LAB — LACTATE DEHYDROGENASE: LDH: 246 U/L — ABNORMAL HIGH (ref 98–192)

## 2021-11-04 LAB — VITAMIN B12: Vitamin B-12: 1157 pg/mL — ABNORMAL HIGH (ref 180–914)

## 2021-11-04 MED ORDER — PANTOPRAZOLE SODIUM 20 MG PO TBEC
40.0000 mg | DELAYED_RELEASE_TABLET | Freq: Every day | ORAL | 3 refills | Status: DC
  Filled 2021-11-04: qty 180, 90d supply, fill #0
  Filled 2022-02-27: qty 180, 90d supply, fill #1
  Filled 2022-05-01: qty 180, 90d supply, fill #2
  Filled 2022-08-07: qty 180, 90d supply, fill #3

## 2021-11-05 ENCOUNTER — Encounter: Payer: Self-pay | Admitting: Hematology

## 2021-11-10 ENCOUNTER — Other Ambulatory Visit: Payer: Self-pay | Admitting: Hematology

## 2021-11-10 DIAGNOSIS — D702 Other drug-induced agranulocytosis: Secondary | ICD-10-CM | POA: Insufficient documentation

## 2021-11-14 ENCOUNTER — Other Ambulatory Visit (HOSPITAL_COMMUNITY): Payer: Self-pay

## 2021-11-25 ENCOUNTER — Telehealth: Payer: Self-pay | Admitting: Hematology

## 2021-11-25 NOTE — Telephone Encounter (Signed)
Rescheduled upcoming appointment due to provider's PAL. Patient is aware of changes. ?

## 2021-11-27 ENCOUNTER — Other Ambulatory Visit (HOSPITAL_COMMUNITY): Payer: Self-pay

## 2021-12-12 ENCOUNTER — Other Ambulatory Visit (HOSPITAL_COMMUNITY): Payer: Self-pay

## 2021-12-19 ENCOUNTER — Inpatient Hospital Stay: Payer: 59 | Attending: Hematology

## 2021-12-19 ENCOUNTER — Other Ambulatory Visit: Payer: Self-pay

## 2021-12-19 ENCOUNTER — Inpatient Hospital Stay (HOSPITAL_BASED_OUTPATIENT_CLINIC_OR_DEPARTMENT_OTHER): Payer: 59 | Admitting: Hematology

## 2021-12-19 VITALS — BP 140/100 | HR 62 | Temp 97.9°F | Resp 18 | Ht 69.0 in | Wt 196.9 lb

## 2021-12-19 DIAGNOSIS — D702 Other drug-induced agranulocytosis: Secondary | ICD-10-CM | POA: Diagnosis not present

## 2021-12-19 DIAGNOSIS — Z5112 Encounter for antineoplastic immunotherapy: Secondary | ICD-10-CM

## 2021-12-19 DIAGNOSIS — C8318 Mantle cell lymphoma, lymph nodes of multiple sites: Secondary | ICD-10-CM | POA: Insufficient documentation

## 2021-12-19 DIAGNOSIS — D701 Agranulocytosis secondary to cancer chemotherapy: Secondary | ICD-10-CM | POA: Insufficient documentation

## 2021-12-19 DIAGNOSIS — T451X5A Adverse effect of antineoplastic and immunosuppressive drugs, initial encounter: Secondary | ICD-10-CM | POA: Diagnosis not present

## 2021-12-19 LAB — CBC WITH DIFFERENTIAL (CANCER CENTER ONLY)
Abs Immature Granulocytes: 0.05 10*3/uL (ref 0.00–0.07)
Basophils Absolute: 0 10*3/uL (ref 0.0–0.1)
Basophils Relative: 1 %
Eosinophils Absolute: 0.1 10*3/uL (ref 0.0–0.5)
Eosinophils Relative: 3 %
HCT: 42.5 % (ref 39.0–52.0)
Hemoglobin: 14.9 g/dL (ref 13.0–17.0)
Immature Granulocytes: 2 %
Lymphocytes Relative: 50 %
Lymphs Abs: 1.4 10*3/uL (ref 0.7–4.0)
MCH: 30.8 pg (ref 26.0–34.0)
MCHC: 35.1 g/dL (ref 30.0–36.0)
MCV: 87.8 fL (ref 80.0–100.0)
Monocytes Absolute: 0.5 10*3/uL (ref 0.1–1.0)
Monocytes Relative: 19 %
Neutro Abs: 0.7 10*3/uL — ABNORMAL LOW (ref 1.7–7.7)
Neutrophils Relative %: 25 %
Platelet Count: 201 10*3/uL (ref 150–400)
RBC: 4.84 MIL/uL (ref 4.22–5.81)
RDW: 12.2 % (ref 11.5–15.5)
WBC Count: 2.8 10*3/uL — ABNORMAL LOW (ref 4.0–10.5)
nRBC: 0 % (ref 0.0–0.2)

## 2021-12-19 LAB — CMP (CANCER CENTER ONLY)
ALT: 27 U/L (ref 0–44)
AST: 29 U/L (ref 15–41)
Albumin: 4.6 g/dL (ref 3.5–5.0)
Alkaline Phosphatase: 100 U/L (ref 38–126)
Anion gap: 5 (ref 5–15)
BUN: 16 mg/dL (ref 8–23)
CO2: 30 mmol/L (ref 22–32)
Calcium: 9.7 mg/dL (ref 8.9–10.3)
Chloride: 105 mmol/L (ref 98–111)
Creatinine: 1.1 mg/dL (ref 0.61–1.24)
GFR, Estimated: >60 mL/min (ref >60)
Glucose, Bld: 88 mg/dL (ref 70–99)
Potassium: 4.1 mmol/L (ref 3.5–5.1)
Sodium: 140 mmol/L (ref 135–145)
Total Bilirubin: 0.6 mg/dL (ref 0.3–1.2)
Total Protein: 7.1 g/dL (ref 6.5–8.1)

## 2021-12-19 LAB — LACTATE DEHYDROGENASE: LDH: 270 U/L — ABNORMAL HIGH (ref 98–192)

## 2021-12-22 ENCOUNTER — Other Ambulatory Visit: Payer: 59

## 2021-12-22 ENCOUNTER — Ambulatory Visit: Payer: 59 | Admitting: Hematology

## 2021-12-22 ENCOUNTER — Ambulatory Visit: Payer: 59

## 2021-12-23 ENCOUNTER — Other Ambulatory Visit: Payer: Self-pay

## 2021-12-23 ENCOUNTER — Inpatient Hospital Stay: Payer: 59 | Attending: Hematology

## 2021-12-23 VITALS — BP 152/96 | HR 62 | Temp 98.8°F | Resp 17 | Wt 192.0 lb

## 2021-12-23 DIAGNOSIS — Z5112 Encounter for antineoplastic immunotherapy: Secondary | ICD-10-CM | POA: Diagnosis not present

## 2021-12-23 DIAGNOSIS — C8318 Mantle cell lymphoma, lymph nodes of multiple sites: Secondary | ICD-10-CM | POA: Diagnosis not present

## 2021-12-23 DIAGNOSIS — Z79899 Other long term (current) drug therapy: Secondary | ICD-10-CM | POA: Diagnosis not present

## 2021-12-23 DIAGNOSIS — Z7189 Other specified counseling: Secondary | ICD-10-CM

## 2021-12-23 MED ORDER — METHYLPREDNISOLONE SODIUM SUCC 125 MG IJ SOLR
125.0000 mg | Freq: Once | INTRAMUSCULAR | Status: AC
  Administered 2021-12-23: 125 mg via INTRAVENOUS
  Filled 2021-12-23: qty 2

## 2021-12-23 MED ORDER — DIPHENHYDRAMINE HCL 25 MG PO CAPS
50.0000 mg | ORAL_CAPSULE | Freq: Once | ORAL | Status: AC
  Administered 2021-12-23: 50 mg via ORAL
  Filled 2021-12-23: qty 2

## 2021-12-23 MED ORDER — ACETAMINOPHEN 325 MG PO TABS
650.0000 mg | ORAL_TABLET | Freq: Once | ORAL | Status: AC
  Administered 2021-12-23: 650 mg via ORAL
  Filled 2021-12-23: qty 2

## 2021-12-23 MED ORDER — SODIUM CHLORIDE 0.9 % IV SOLN: Freq: Once | INTRAVENOUS | Status: AC

## 2021-12-23 MED ORDER — FAMOTIDINE IN NACL 20-0.9 MG/50ML-% IV SOLN
20.0000 mg | Freq: Once | INTRAVENOUS | Status: AC
  Administered 2021-12-23: 20 mg via INTRAVENOUS
  Filled 2021-12-23: qty 50

## 2021-12-23 MED ORDER — SODIUM CHLORIDE 0.9 % IV SOLN
375.0000 mg/m2 | Freq: Once | INTRAVENOUS | Status: AC
  Administered 2021-12-23: 800 mg via INTRAVENOUS
  Filled 2021-12-23: qty 50

## 2021-12-23 NOTE — Patient Instructions (Signed)
[facility]COLOG[facility]  Discharge Instructions: Thank you for choosing [facility] to provide your oncology and hematology care.   If you have a lab appointment with the Cancer Center, please go directly to the Cancer Center and check in at the registration area.   Wear comfortable clothing and clothing appropriate for easy access to any Portacath or PICC line.   We strive to give you quality time with your provider. You may need to reschedule your appointment if you arrive late (15 or more minutes).  Arriving late affects you and other patients whose appointments are after yours.  Also, if you miss three or more appointments without notifying the office, you may be dismissed from the clinic at the provider's discretion.      For prescription refill requests, have your pharmacy contact our office and allow 72 hours for refills to be completed.    Today you received the following chemotherapy and/or immunotherapy agents Rituximab      To help prevent nausea and vomiting after your treatment, we encourage you to take your nausea medication as directed.  BELOW ARE SYMPTOMS THAT SHOULD BE REPORTED IMMEDIATELY: *FEVER GREATER THAN 100.4 F (38 C) OR HIGHER *CHILLS OR SWEATING *NAUSEA AND VOMITING THAT IS NOT CONTROLLED WITH YOUR NAUSEA MEDICATION *UNUSUAL SHORTNESS OF BREATH *UNUSUAL BRUISING OR BLEEDING *URINARY PROBLEMS (pain or burning when urinating, or frequent urination) *BOWEL PROBLEMS (unusual diarrhea, constipation, pain near the anus) TENDERNESS IN MOUTH AND THROAT WITH OR WITHOUT PRESENCE OF ULCERS (sore throat, sores in mouth, or a toothache) UNUSUAL RASH, SWELLING OR PAIN  UNUSUAL VAGINAL DISCHARGE OR ITCHING   Items with * indicate a potential emergency and should be followed up as soon as possible or go to the Emergency Department if any problems should occur.  Please show the CHEMOTHERAPY ALERT CARD or IMMUNOTHERAPY ALERT CARD at check-in to  the Emergency Department and triage nurse.  Should you have questions after your visit or need to cancel or reschedule your appointment, please contact [facility]  Dept: [phone]  and follow the prompts.  Office hours are 8:00 a.m. to 4:30 p.m. Monday - Friday. Please note that voicemails left after 4:00 p.m. may not be returned until the following business day.  We are closed weekends and major holidays. You have access to a nurse at all times for urgent questions. Please call the main number to the clinic Dept: [phone] and follow the prompts.   For any non-urgent questions, you may also contact your provider using MyChart. We now offer e-Visits for anyone 18 and older to request care online for non-urgent symptoms. For details visit [email or web address].   Also download the MyChart app! Go to the app store, search "MyChart", open the app, select [facility], and log in with your MyChart username and password.  Due to Covid, a mask is required upon entering the hospital/clinic. If you do not have a mask, one will be given to you upon arrival. For doctor visits, patients may have 1 support person aged 18 or older with them. For treatment visits, patients cannot have anyone with them due to current Covid guidelines and our immunocompromised population.   

## 2021-12-25 NOTE — Progress Notes (Signed)
? ? ?HEMATOLOGY/ONCOLOGY CLINIC NOTE ? ?Date of Service: .12/19/2021 ? ?Patient Care Team: ?Brian Arabian, MD as PCP - General (Family Medicine) ? ?CHIEF COMPLAINTS/PURPOSE OF CONSULTATION:  ?Follow-up for continued evaluation and management of mantle cell lymphoma ? ?HISTORY OF PRESENTING ILLNESS:  ? ?Please see previous note for details on initial presentation. ? ?INTERVAL HISTORY: ? ?Brian Kaiser is here for continued evaluation and management of his mantle cell lymphoma. ?Patient notes no acute new symptoms since his last clinic visit. ?Continues to have some neutropenia but has had no issues with infection issues. ?No other acute toxicities from his last dose of Rituxan.Marland Kitchen ?Labs done today reviewed in detail with the patient. ? ?MEDICAL HISTORY:  ?Past Medical History:  ?Diagnosis Date  ? Allergy   ? Forearm laceration   ? right forearm  ? GERD (gastroesophageal reflux disease)   ? Hepatitis B   ? Hyperlipidemia   ? Pulmonary lesion   ? Left pulmonary schwannoma  ? Schwannoma   ? left lower lung  ? ? ?SURGICAL HISTORY: ?Past Surgical History:  ?Procedure Laterality Date  ? COLONOSCOPY    ? LIGAMENT REPAIR Right 04/19/2019  ? Right forearm  ? LIPOMA EXCISION Left   ? neck  ? LUNG SURGERY Left   ? OTHER SURGICAL HISTORY Left   ? pulmonary schwannoma excision  ? UPPER GASTROINTESTINAL ENDOSCOPY    ? WOUND EXPLORATION Right 04/19/2019  ? Procedure: WOUND EXPLORATION; REPAIR RIGHT FOREARM LACERATION;  Surgeon: Charlotte Crumb, MD;  Location: [facility];  Service: Orthopedics;  Laterality: Right;  AXILLARY BLOCK AND MAC  ? ? ?SOCIAL HISTORY: ?Social History  ? ?Socioeconomic History  ? Marital status: Married  ?  Spouse name: Not on file  ? Number of children: Not on file  ? Years of education: Not on file  ? Highest education level: Not on file  ?Occupational History  ? Not on file  ?Tobacco Use  ? Smoking status: Never  ? Smokeless tobacco: Never  ?Vaping Use  ? Vaping Use: Never used  ?Substance  and Sexual Activity  ? Alcohol use: No  ?  Alcohol/week: 0.0 standard drinks  ? Drug use: No  ? Sexual activity: Not on file  ?Other Topics Concern  ? Not on file  ?Social History Narrative  ? Not on file  ? ?Social Determinants of Health  ? ?Financial Resource Strain: Not on file  ?Food Insecurity: Not on file  ?Transportation Needs: Not on file  ?Physical Activity: Not on file  ?Stress: Not on file  ?Social Connections: Not on file  ?Intimate Partner Violence: Not on file  ? ? ?FAMILY HISTORY: ?Family History  ?Problem Relation Age of Onset  ? Macular degeneration Mother   ? Dementia Mother   ? Diverticulitis Mother   ? Prostate cancer Paternal Grandfather   ? Diverticulitis Brother   ? Colon cancer Neg Hx   ? Colon polyps Neg Hx   ? Esophageal cancer Neg Hx   ? Liver cancer Neg Hx   ? Pancreatic cancer Neg Hx   ? Rectal cancer Neg Hx   ? Stomach cancer Neg Hx   ? ? ?ALLERGIES:  is allergic to augmentin [amoxicillin-pot clavulanate]. ? ?MEDICATIONS:  ?Current Outpatient Medications  ?Medication Sig Dispense Refill  ? acetaminophen (TYLENOL) 325 MG tablet Take 325 mg by mouth every 6 (six) hours as needed for moderate pain or headache.    ? allopurinol (ZYLOPRIM) 100 MG tablet TAKE 1 TABLET BY MOUTH TWO TIMES DAILY  60 tablet 0  ? cholecalciferol (VITAMIN D3) 25 MCG (1000 UNIT) tablet Take 1,000 Units by mouth daily.    ? famotidine (PEPCID) 20 MG tablet Take 1 tablet (20 mg total) by mouth at bedtime. (Patient not taking: Reported on 08/22/2021) 30 tablet 3  ? levothyroxine (SYNTHROID) 25 MCG tablet Take 1 tablet by mouth daily in the morning on an empty stomach 90 tablet 3  ? Multiple Vitamin (MULTIVITAMIN) tablet Take 1 tablet by mouth daily.    ? mupirocin ointment (BACTROBAN) 2 % Apply to affected area up to 3 times a day as needed (Patient not taking: Reported on 08/22/2021) 22 g PRN  ? Omega-3 Fatty Acids (FISH OIL) 1000 MG CAPS Take 1,000 mg by mouth daily.    ? pantoprazole (PROTONIX) 20 MG tablet Take 2  tablets (40 mg total) by mouth daily. 180 tablet 3  ? vitamin C (ASCORBIC ACID) 250 MG tablet Take 250 mg by mouth daily.    ? ?Current Facility-Administered Medications  ?Medication Dose Route Frequency Provider Last Rate Last Admin  ? 0.9 %  sodium chloride infusion   Intravenous PRN Eileen Stanford, PA-C      ? ? ?REVIEW OF SYSTEMS:   ?10 Point review of Systems was done is negative except as noted above. ? ?PHYSICAL EXAMINATION: ?ECOG PERFORMANCE STATUS: 1 - Symptomatic but completely ambulatory ?.BP (!) 140/100 (BP Location: Left Arm, Patient Position: Sitting) Comment: nurse notified  Pulse 62   Temp 97.9 ?F (36.6 ?C) (Temporal)   Resp 18   Ht _0  (1.753 m)   Wt 196 lb 14.4 oz (89.3 kg)   SpO2 100%   BMI 29.08 kg/m?  ?NAD ?GENERAL:alert, in no acute distress and comfortable ?SKIN: no acute rashes, no significant lesions ?EYES: conjunctiva are pink and non-injected, sclera anicteric ?OROPHARYNX: MMM, no exudates, no oropharyngeal erythema or ulceration ?NECK: supple, no JVD ?LYMPH:  no palpable lymphadenopathy in the cervical, axillary or inguinal regions ?LUNGS: clear to auscultation b/l with normal respiratory effort ?HEART: regular rate & rhythm ?ABDOMEN:  normoactive bowel sounds , non tender, not distended. ?Extremity: no pedal edema ?PSYCH: alert & oriented x 3 with fluent speech ?NEURO: no focal motor/sensory deficits ? ? ?LABORATORY DATA:  ?I have reviewed the data as listed ? ?. ? ?  Latest Ref Rng & Units 12/19/2021  ? 11:51 AM 11/04/2021  ?  4:10 PM 10/21/2021  ?  1:25 PM  ?CBC  ?WBC 4.0 - 10.5 K/uL 2.8   2.2   2.0    ?Hemoglobin 13.0 - 17.0 g/dL 14.9   14.8   14.6    ?Hematocrit 39.0 - 52.0 % 42.5   41.2   41.3    ?Platelets 150 - 400 K/uL 201   208   197    ? ?[place] 700 ?. ? ?  Latest Ref Rng & Units 12/19/2021  ? 11:51 AM 11/04/2021  ?  4:10 PM 10/21/2021  ?  1:25 PM  ?CMP  ?Glucose 70 - 99 mg/dL 88   100   100    ?BUN 8 - 23 mg/dL _1 ?Creatinine 0.61 - 1.24 mg/dL 1.10   1.00    1.11    ?Sodium 135 - 145 mmol/L 140   139   141    ?Potassium 3.5 - 5.1 mmol/L 4.1   3.8   3.6    ?Chloride 98 - 111 mmol/L 105   108  109    ?CO2 22 - 32 mmol/L _0 ?Calcium 8.9 - 10.3 mg/dL 9.7   9.4   9.5    ?Total Protein 6.5 - 8.1 g/dL 7.1   6.8   6.9    ?Total Bilirubin 0.3 - 1.2 mg/dL 0.6   0.3   0.5    ?Alkaline Phos 38 - 126 U/L 100   103   85    ?AST 15 - 41 U/L _1 ?ALT 0 - 44 U/L _2 ? ?. ?Lab Results  ?Component Value Date  ? LDH 270 (H) 12/19/2021  ? ? ?06/20/2020 Right Axillary Surgical Pathology Report [id number]):  ? ? ?06/20/2020 Right Axillary Lymph Node Flow Pathology [id number]): ? ? ?06/11/2020 Flow Pathology [id number]): ? ? ?RADIOGRAPHIC STUDIES: ?I have personally reviewed the radiological images as listed and agreed with the findings in the report. ?No results found. ? ?ASSESSMENT & PLAN:  ? ?66 yo with  ? ?1) Recently diagnosed stage IV mantle cell lymphoma with extensive lymphadenopathy and massive splenomegaly . ?2) s/p massive splenomegaly likely related to mantle cell lymphoma  ?3) thrombocytopenia-mild platelets of 132k likely related to lymphoma. ?4) hepatitis B core antibody positive, surface antigen negative, HBV DNA PCR negative ?5) Neutropenia - intermitent fluctuating-- recent URI, could be immunologic changes from Rituxan use. No fevers. ? ?PLAN: ?--Labs done today were reviewed with the patient in detail. ?CBC within normal limits with a hemoglobin of 14.9, WBC count of 2.8k and platelets of 201k ? ?Lab Results  ?Component Value Date  ? LDH 270 (H) 12/19/2021  ? ?-No lab findings or clinical symptom suggestive of lymphoma recurrence/progression at this time. ?-Continue follow-up with primary care physician for management of other medical comorbidities. ? ?FOLLOW UP: ?Labs in 2 weeks ?Please schedule next cycle of maintenance Rituxan in 2 months with port flush labs and MD visit ? ? ?All of the patient's questions were answered  with apparent satisfaction. The patient knows to call the clinic with any problems, questions or concerns. ? ?  ?Sullivan Lone MD MS AAHIVMS [facility] ?Hematology/Oncology Physician ?[place] ?

## 2021-12-26 ENCOUNTER — Encounter: Payer: Self-pay | Admitting: Hematology

## 2022-01-05 ENCOUNTER — Other Ambulatory Visit: Payer: Self-pay | Admitting: Hematology

## 2022-01-14 DIAGNOSIS — D225 Melanocytic nevi of trunk: Secondary | ICD-10-CM | POA: Diagnosis not present

## 2022-01-14 DIAGNOSIS — Z1283 Encounter for screening for malignant neoplasm of skin: Secondary | ICD-10-CM | POA: Diagnosis not present

## 2022-02-02 ENCOUNTER — Telehealth: Payer: Self-pay | Admitting: Hematology

## 2022-02-02 NOTE — Telephone Encounter (Signed)
Left message with follow-up appointment per 3/31 los. ?

## 2022-02-17 ENCOUNTER — Other Ambulatory Visit: Payer: Self-pay

## 2022-02-17 DIAGNOSIS — C8318 Mantle cell lymphoma, lymph nodes of multiple sites: Secondary | ICD-10-CM

## 2022-02-23 ENCOUNTER — Inpatient Hospital Stay: Payer: 59 | Attending: Hematology | Admitting: Hematology

## 2022-02-23 ENCOUNTER — Other Ambulatory Visit: Payer: Self-pay

## 2022-02-23 ENCOUNTER — Inpatient Hospital Stay: Payer: 59

## 2022-02-23 VITALS — BP 169/93 | HR 72 | Temp 98.0°F | Resp 18 | Ht 69.0 in | Wt 194.5 lb

## 2022-02-23 DIAGNOSIS — Z8042 Family history of malignant neoplasm of prostate: Secondary | ICD-10-CM | POA: Insufficient documentation

## 2022-02-23 DIAGNOSIS — D709 Neutropenia, unspecified: Secondary | ICD-10-CM | POA: Insufficient documentation

## 2022-02-23 DIAGNOSIS — D696 Thrombocytopenia, unspecified: Secondary | ICD-10-CM | POA: Diagnosis not present

## 2022-02-23 DIAGNOSIS — C8318 Mantle cell lymphoma, lymph nodes of multiple sites: Secondary | ICD-10-CM

## 2022-02-23 DIAGNOSIS — R768 Other specified abnormal immunological findings in serum: Secondary | ICD-10-CM | POA: Insufficient documentation

## 2022-02-23 LAB — CMP (CANCER CENTER ONLY)
ALT: 30 U/L (ref 0–44)
AST: 29 U/L (ref 15–41)
Albumin: 4.5 g/dL (ref 3.5–5.0)
Alkaline Phosphatase: 82 U/L (ref 38–126)
Anion gap: 7 (ref 5–15)
BUN: 18 mg/dL (ref 8–23)
CO2: 26 mmol/L (ref 22–32)
Calcium: 9.7 mg/dL (ref 8.9–10.3)
Chloride: 108 mmol/L (ref 98–111)
Creatinine: 1.01 mg/dL (ref 0.61–1.24)
GFR, Estimated: >60 mL/min (ref >60)
Glucose, Bld: 101 mg/dL — ABNORMAL HIGH (ref 70–99)
Potassium: 4 mmol/L (ref 3.5–5.1)
Sodium: 141 mmol/L (ref 135–145)
Total Bilirubin: 0.4 mg/dL (ref 0.3–1.2)
Total Protein: 6.7 g/dL (ref 6.5–8.1)

## 2022-02-23 LAB — CBC WITH DIFFERENTIAL (CANCER CENTER ONLY)
Abs Immature Granulocytes: 0.02 10*3/uL (ref 0.00–0.07)
Basophils Absolute: 0 10*3/uL (ref 0.0–0.1)
Basophils Relative: 2 %
Eosinophils Absolute: 0.1 10*3/uL (ref 0.0–0.5)
Eosinophils Relative: 3 %
HCT: 43.1 % (ref 39.0–52.0)
Hemoglobin: 15.4 g/dL (ref 13.0–17.0)
Immature Granulocytes: 1 %
Lymphocytes Relative: 55 %
Lymphs Abs: 1.2 10*3/uL (ref 0.7–4.0)
MCH: 31.4 pg (ref 26.0–34.0)
MCHC: 35.7 g/dL (ref 30.0–36.0)
MCV: 88 fL (ref 80.0–100.0)
Monocytes Absolute: 0.5 10*3/uL (ref 0.1–1.0)
Monocytes Relative: 26 %
Neutro Abs: 0.3 10*3/uL — CL (ref 1.7–7.7)
Neutrophils Relative %: 13 %
Platelet Count: 210 10*3/uL (ref 150–400)
RBC: 4.9 MIL/uL (ref 4.22–5.81)
RDW: 12.4 % (ref 11.5–15.5)
WBC Count: 2.1 10*3/uL — ABNORMAL LOW (ref 4.0–10.5)
nRBC: 0 % (ref 0.0–0.2)

## 2022-02-23 LAB — LACTATE DEHYDROGENASE: LDH: 222 U/L — ABNORMAL HIGH (ref 98–192)

## 2022-02-23 NOTE — Progress Notes (Signed)
HEMATOLOGY/ONCOLOGY CLINIC NOTE  Date of Service: 02/23/2022  Patient Care Team: [name], MD as PCP - General (Family Medicine)  CHIEF COMPLAINTS/PURPOSE OF CONSULTATION:  Follow-up for continued evaluation and management of mantle cell lymphoma  HISTORY OF PRESENTING ILLNESS:  Please see previous note for details on initial presentation.  INTERVAL HISTORY: Brian Kaiser is a 66 y.o. male here for continued evaluation and management of his mantle cell lymphoma. He reports He is doing well with no new symptoms or concerns.  We discussed getting a bone marrow biopsy which he was agreeable to.  No other new or acute symptoms.  Continues to have some neutropenia but has had no issues with infection issues.  No other acute toxicities from his last dose of Rituxan.  Labs done today were reviewed with the patient in detail. CBC shows hemoglobin of 15.4, WBC count of 2.1k and platelets of 210k. Severe neutropenia with ANC of 0.3k. LDH at ***.  MEDICAL HISTORY:  Past Medical History:  Diagnosis Date   Allergy    Forearm laceration    right forearm   GERD (gastroesophageal reflux disease)    Hepatitis B    Hyperlipidemia    Pulmonary lesion    Left pulmonary schwannoma   Schwannoma    left lower lung    SURGICAL HISTORY: Past Surgical History:  Procedure Laterality Date   COLONOSCOPY     LIGAMENT REPAIR Right 04/19/2019   Right forearm   LIPOMA EXCISION Left    neck   LUNG SURGERY Left    OTHER SURGICAL HISTORY Left    pulmonary schwannoma excision   UPPER GASTROINTESTINAL ENDOSCOPY     WOUND EXPLORATION Right 04/19/2019   Procedure: WOUND EXPLORATION; REPAIR RIGHT FOREARM LACERATION;  Surgeon: Charlotte Crumb, MD;  Location: [facility];  Service: Orthopedics;  Laterality: Right;  AXILLARY BLOCK AND MAC    SOCIAL HISTORY: Social History   Socioeconomic History   Marital status: Married    Spouse name: Not on file   Number of children:  Not on file   Years of education: Not on file   Highest education level: Not on file  Occupational History   Not on file  Tobacco Use   Smoking status: Never   Smokeless tobacco: Never  Vaping Use   Vaping Use: Never used  Substance and Sexual Activity   Alcohol use: No    Alcohol/week: 0.0 standard drinks   Drug use: No   Sexual activity: Not on file  Other Topics Concern   Not on file  Social History Narrative   Not on file   Social Determinants of Health   Financial Resource Strain: Not on file  Food Insecurity: Not on file  Transportation Needs: Not on file  Physical Activity: Not on file  Stress: Not on file  Social Connections: Not on file  Intimate Partner Violence: Not on file    FAMILY HISTORY: Family History  Problem Relation Age of Onset   Macular degeneration Mother    Dementia Mother    Diverticulitis Mother    Prostate cancer Paternal Grandfather    Diverticulitis Brother    Colon cancer Neg Hx    Colon polyps Neg Hx    Esophageal cancer Neg Hx    Liver cancer Neg Hx    Pancreatic cancer Neg Hx    Rectal cancer Neg Hx    Stomach cancer Neg Hx     ALLERGIES:  is allergic to augmentin [amoxicillin-pot clavulanate].  MEDICATIONS:  Current Outpatient Medications  Medication Sig Dispense Refill   acetaminophen (TYLENOL) 325 MG tablet Take 325 mg by mouth every 6 (six) hours as needed for moderate pain or headache.     allopurinol (ZYLOPRIM) 100 MG tablet TAKE 1 TABLET BY MOUTH TWO TIMES DAILY 60 tablet 0   cholecalciferol (VITAMIN D3) 25 MCG (1000 UNIT) tablet Take 1,000 Units by mouth daily.     famotidine (PEPCID) 20 MG tablet Take 1 tablet (20 mg total) by mouth at bedtime. (Patient not taking: Reported on 08/22/2021) 30 tablet 3   levothyroxine (SYNTHROID) 25 MCG tablet Take 1 tablet by mouth daily in the morning on an empty stomach 90 tablet 3   Multiple Vitamin (MULTIVITAMIN) tablet Take 1 tablet by mouth daily.     mupirocin ointment  (BACTROBAN) 2 % Apply to affected area up to 3 times a day as needed (Patient not taking: Reported on 08/22/2021) 22 g PRN   Omega-3 Fatty Acids (FISH OIL) 1000 MG CAPS Take 1,000 mg by mouth daily.     pantoprazole (PROTONIX) 20 MG tablet Take 2 tablets (40 mg total) by mouth daily. 180 tablet 3   vitamin C (ASCORBIC ACID) 250 MG tablet Take 250 mg by mouth daily.     Current Facility-Administered Medications  Medication Dose Route Frequency Provider Last Rate Last Admin   0.9 %  sodium chloride infusion   Intravenous PRN Eileen Stanford, PA-C        REVIEW OF SYSTEMS:   10 Point review of Systems was done is negative except as noted above.  PHYSICAL EXAMINATION: ECOG PERFORMANCE STATUS: 1 - Symptomatic but completely ambulatory .BP (!) 169/93 (BP Location: Left Arm, Patient Position: Sitting)   Pulse 72   Temp 98 F (36.7 C) (Oral)   Resp 18   Ht _0  (1.753 m)   Wt 194 lb 8 oz (88.2 kg)   SpO2 100%   BMI 28.72 kg/m  NAD GENERAL:alert, in no acute distress and comfortable SKIN: no acute rashes, no significant lesions EYES: conjunctiva are pink and non-injected, sclera anicteric NECK: supple, no JVD LYMPH:  no palpable lymphadenopathy in the cervical, axillary or inguinal regions LUNGS: clear to auscultation b/l with normal respiratory effort HEART: regular rate & rhythm ABDOMEN:  normoactive bowel sounds , non tender, not distended. Extremity: no pedal edema PSYCH: alert & oriented x 3 with fluent speech NEURO: no focal motor/sensory deficits  LABORATORY DATA:  I have reviewed the data as listed  .    Latest Ref Rng & Units 02/23/2022    1:18 PM 12/19/2021   11:51 AM 11/04/2021    4:10 PM  CBC  WBC 4.0 - 10.5 K/uL 2.1   2.8   2.2    Hemoglobin 13.0 - 17.0 g/dL 15.4   14.9   14.8    Hematocrit 39.0 - 52.0 % 43.1   42.5   41.2    Platelets 150 - 400 K/uL 210   201   208     ANC 700 .    Latest Ref Rng & Units 12/19/2021   11:51 AM 11/04/2021    4:10 PM  10/21/2021    1:25 PM  CMP  Glucose 70 - 99 mg/dL 88   100   100    BUN 8 - 23 mg/dL _1 Creatinine 0.61 - 1.24 mg/dL 1.10   1.00   1.11    Sodium 135 - 145 mmol/L  140   139   141    Potassium 3.5 - 5.1 mmol/L 4.1   3.8   3.6    Chloride 98 - 111 mmol/L 105   108   109    CO2 22 - 32 mmol/L _0 Calcium 8.9 - 10.3 mg/dL 9.7   9.4   9.5    Total Protein 6.5 - 8.1 g/dL 7.1   6.8   6.9    Total Bilirubin 0.3 - 1.2 mg/dL 0.6   0.3   0.5    Alkaline Phos 38 - 126 U/L 100   103   85    AST 15 - 41 U/L _1 ALT 0 - 44 U/L _2 . Lab Results  Component Value Date   LDH 270 (H) 12/19/2021    06/20/2020 Right Axillary Surgical Pathology Report [id number]):    06/20/2020 Right Axillary Lymph Node Flow Pathology ([id number]):   06/11/2020 Flow Pathology ([id number]):   RADIOGRAPHIC STUDIES: I have personally reviewed the radiological images as listed and agreed with the findings in the report. No results found.  ASSESSMENT & PLAN:   66 yo with   1) Recently diagnosed stage IV mantle cell lymphoma with extensive lymphadenopathy and massive splenomegaly . 2) s/p massive splenomegaly likely related to mantle cell lymphoma  3) thrombocytopenia-mild platelets of 132k likely related to lymphoma. 4) hepatitis B core antibody positive, surface antigen negative, HBV DNA PCR negative 5) Neutropenia - intermitent fluctuating-- recent URI, could be immunologic changes from Rituxan use. No fevers.  PLAN: -Labs done today were reviewed with the patient in detail. CBC shows hemoglobin of 15.4, WBC count of 2.1k and platelets of 210k. Severe neutropenia with ANC of 0.3k. LDH at ***. -No lab findings or clinical symptom suggestive of lymphoma recurrence/progression at this time. -Continue follow-up with primary care physician for management of other medical comorbidities. -Schedule CT bone marrow aspiration and biopsy in 2 weeks. -Phone  visit in 4 weeks with labs done 1 day prior.  FOLLOW UP: CT bone marrow aspiration and biopsy in 2 weeks Phone visit with Dr Irene Limbo in 4 weeks with labs the day prior   All of the patient's questions were answered with apparent satisfaction. The patient knows to call the clinic with any problems, questions or concerns.    Sullivan Lone MD MS AAHIVMS [facility] Hematology/Oncology Physician [facility]  I, Melene Muller, am acting as scribe for Dr. Sullivan Lone, MD.

## 2022-02-24 ENCOUNTER — Telehealth: Payer: Self-pay | Admitting: Hematology

## 2022-02-24 NOTE — Telephone Encounter (Signed)
Scheduled follow-up appointments per 6/5 los. Patient is aware. 

## 2022-02-26 ENCOUNTER — Encounter: Payer: Self-pay | Admitting: Hematology

## 2022-02-27 ENCOUNTER — Other Ambulatory Visit (HOSPITAL_COMMUNITY): Payer: Self-pay

## 2022-03-09 ENCOUNTER — Encounter: Payer: Self-pay | Admitting: Hematology

## 2022-03-09 ENCOUNTER — Other Ambulatory Visit: Payer: Self-pay | Admitting: Hematology

## 2022-03-09 DIAGNOSIS — D709 Neutropenia, unspecified: Secondary | ICD-10-CM

## 2022-03-09 DIAGNOSIS — C8318 Mantle cell lymphoma, lymph nodes of multiple sites: Secondary | ICD-10-CM

## 2022-03-09 NOTE — Progress Notes (Signed)
Bone marrow biopsy ordered

## 2022-03-16 ENCOUNTER — Telehealth: Payer: Self-pay | Admitting: Hematology

## 2022-03-16 NOTE — Telephone Encounter (Signed)
Rescheduled upcoming appointment due to provider's schedule. Patient is aware of changes. 

## 2022-03-20 ENCOUNTER — Other Ambulatory Visit: Payer: Self-pay

## 2022-03-20 DIAGNOSIS — C8318 Mantle cell lymphoma, lymph nodes of multiple sites: Secondary | ICD-10-CM

## 2022-03-23 ENCOUNTER — Inpatient Hospital Stay: Payer: 59 | Attending: Hematology

## 2022-03-23 DIAGNOSIS — C8318 Mantle cell lymphoma, lymph nodes of multiple sites: Secondary | ICD-10-CM | POA: Diagnosis not present

## 2022-03-23 LAB — CBC WITH DIFFERENTIAL (CANCER CENTER ONLY)
Abs Immature Granulocytes: 0.04 10*3/uL (ref 0.00–0.07)
Basophils Absolute: 0.1 10*3/uL (ref 0.0–0.1)
Basophils Relative: 2 %
Eosinophils Absolute: 0.1 10*3/uL (ref 0.0–0.5)
Eosinophils Relative: 4 %
HCT: 41.5 % (ref 39.0–52.0)
Hemoglobin: 15 g/dL (ref 13.0–17.0)
Immature Granulocytes: 2 %
Lymphocytes Relative: 52 %
Lymphs Abs: 1.2 10*3/uL (ref 0.7–4.0)
MCH: 31.2 pg (ref 26.0–34.0)
MCHC: 36.1 g/dL — ABNORMAL HIGH (ref 30.0–36.0)
MCV: 86.3 fL (ref 80.0–100.0)
Monocytes Absolute: 0.5 10*3/uL (ref 0.1–1.0)
Monocytes Relative: 24 %
Neutro Abs: 0.4 10*3/uL — CL (ref 1.7–7.7)
Neutrophils Relative %: 16 %
Platelet Count: 196 10*3/uL (ref 150–400)
RBC: 4.81 MIL/uL (ref 4.22–5.81)
RDW: 12.3 % (ref 11.5–15.5)
WBC Count: 2.2 10*3/uL — ABNORMAL LOW (ref 4.0–10.5)
nRBC: 0 % (ref 0.0–0.2)

## 2022-03-23 LAB — CMP (CANCER CENTER ONLY)
ALT: 27 U/L (ref 0–44)
AST: 29 U/L (ref 15–41)
Albumin: 4.7 g/dL (ref 3.5–5.0)
Alkaline Phosphatase: 76 U/L (ref 38–126)
Anion gap: 7 (ref 5–15)
BUN: 17 mg/dL (ref 8–23)
CO2: 27 mmol/L (ref 22–32)
Calcium: 9.8 mg/dL (ref 8.9–10.3)
Chloride: 106 mmol/L (ref 98–111)
Creatinine: 1.08 mg/dL (ref 0.61–1.24)
GFR, Estimated: >60 mL/min (ref >60)
Glucose, Bld: 103 mg/dL — ABNORMAL HIGH (ref 70–99)
Potassium: 3.8 mmol/L (ref 3.5–5.1)
Sodium: 140 mmol/L (ref 135–145)
Total Bilirubin: 0.4 mg/dL (ref 0.3–1.2)
Total Protein: 7.2 g/dL (ref 6.5–8.1)

## 2022-03-23 LAB — LACTATE DEHYDROGENASE: LDH: 250 U/L — ABNORMAL HIGH (ref 98–192)

## 2022-03-23 NOTE — Progress Notes (Signed)
CRITICAL VALUE STICKER  CRITICAL VALUE: ANC 0.4  DATE & TIME NOTIFIED: 03/23/22 4:25 pm  MD NOTIFIED: Irene Limbo  TIME OF NOTIFICATION:04:35 pm

## 2022-03-25 ENCOUNTER — Inpatient Hospital Stay: Payer: 59 | Admitting: Hematology

## 2022-04-06 ENCOUNTER — Other Ambulatory Visit: Payer: Self-pay | Admitting: Internal Medicine

## 2022-04-06 DIAGNOSIS — C8318 Mantle cell lymphoma, lymph nodes of multiple sites: Secondary | ICD-10-CM

## 2022-04-07 ENCOUNTER — Ambulatory Visit (HOSPITAL_COMMUNITY)
Admission: RE | Admit: 2022-04-07 | Discharge: 2022-04-07 | Disposition: A | Discharge status: Home / Self Care | Payer: 59 | Source: Ambulatory Visit | Attending: Hematology | Admitting: Hematology

## 2022-04-07 ENCOUNTER — Other Ambulatory Visit: Payer: Self-pay

## 2022-04-07 ENCOUNTER — Encounter (HOSPITAL_COMMUNITY): Payer: Self-pay

## 2022-04-07 DIAGNOSIS — C8310 Mantle cell lymphoma, unspecified site: Secondary | ICD-10-CM | POA: Diagnosis not present

## 2022-04-07 DIAGNOSIS — Z8572 Personal history of non-Hodgkin lymphomas: Secondary | ICD-10-CM | POA: Diagnosis not present

## 2022-04-07 DIAGNOSIS — C8318 Mantle cell lymphoma, lymph nodes of multiple sites: Secondary | ICD-10-CM | POA: Insufficient documentation

## 2022-04-07 DIAGNOSIS — D709 Neutropenia, unspecified: Secondary | ICD-10-CM | POA: Insufficient documentation

## 2022-04-07 DIAGNOSIS — M25551 Pain in right hip: Secondary | ICD-10-CM | POA: Diagnosis not present

## 2022-04-07 LAB — CBC WITH DIFFERENTIAL/PLATELET
Abs Immature Granulocytes: 0.02 10*3/uL (ref 0.00–0.07)
Basophils Absolute: 0.1 10*3/uL (ref 0.0–0.1)
Basophils Relative: 2 %
Eosinophils Absolute: 0.1 10*3/uL (ref 0.0–0.5)
Eosinophils Relative: 3 %
HCT: 46 % (ref 39.0–52.0)
Hemoglobin: 15.9 g/dL (ref 13.0–17.0)
Immature Granulocytes: 1 %
Lymphocytes Relative: 60 %
Lymphs Abs: 1.6 10*3/uL (ref 0.7–4.0)
MCH: 31.2 pg (ref 26.0–34.0)
MCHC: 34.6 g/dL (ref 30.0–36.0)
MCV: 90.2 fL (ref 80.0–100.0)
Monocytes Absolute: 0.6 10*3/uL (ref 0.1–1.0)
Monocytes Relative: 22 %
Neutro Abs: 0.3 10*3/uL — CL (ref 1.7–7.7)
Neutrophils Relative %: 12 %
Platelets: 210 10*3/uL (ref 150–400)
RBC: 5.1 MIL/uL (ref 4.22–5.81)
RDW: 12.5 % (ref 11.5–15.5)
WBC: 2.7 10*3/uL — ABNORMAL LOW (ref 4.0–10.5)
nRBC: 0 % (ref 0.0–0.2)

## 2022-04-07 MED ORDER — FENTANYL CITRATE (PF) 100 MCG/2ML IJ SOLN
INTRAMUSCULAR | Status: AC | PRN
  Administered 2022-04-07: 50 ug via INTRAVENOUS

## 2022-04-07 MED ORDER — FENTANYL CITRATE (PF) 100 MCG/2ML IJ SOLN
INTRAMUSCULAR | Status: AC
  Filled 2022-04-07: qty 4

## 2022-04-07 MED ORDER — MIDAZOLAM HCL 2 MG/2ML IJ SOLN
INTRAMUSCULAR | Status: AC | PRN
  Administered 2022-04-07: 1 mg via INTRAVENOUS

## 2022-04-07 MED ORDER — MIDAZOLAM HCL 2 MG/2ML IJ SOLN
INTRAMUSCULAR | Status: AC
  Filled 2022-04-07: qty 4

## 2022-04-07 MED ORDER — LIDOCAINE HCL 1 % IJ SOLN
INTRAMUSCULAR | Status: AC | PRN
  Administered 2022-04-07: 10 mL via INTRADERMAL

## 2022-04-07 MED ORDER — NALOXONE HCL 0.4 MG/ML IJ SOLN
INTRAMUSCULAR | Status: AC
  Filled 2022-04-07: qty 1

## 2022-04-07 MED ORDER — FLUMAZENIL 0.5 MG/5ML IV SOLN
INTRAVENOUS | Status: DC
Start: 2022-04-07 — End: 2022-04-07
  Filled 2022-04-07: qty 5

## 2022-04-07 MED ORDER — SODIUM CHLORIDE 0.9 % IV SOLN: INTRAVENOUS | Status: DC

## 2022-04-07 NOTE — H&P (Signed)
Chief Complaint: Patient was seen in consultation today for  at the request of Brunetta Genera  Referring Physician(s): Brunetta Genera  Supervising Physician: Markus Daft  Patient Status: [facility] - Out-pt  History of Present Illness: Brian Kaiser is a 66 y.o. male with history of mantle cell lymphoma and is treated with Rituxan.  In general he has been asymptomatic.  He does endorse some anxiety, loss of sleep, and ongoing right hip pain of uncertain origin.  He denies HA, CP, palpitations, SOB, abdominal pain, N/V, change in bowel or urinary habits, or change in gait.  He has continues to have neutropenia.  He is referred to IR for bone marrow biopsy.    Past Medical History:  Diagnosis Date   Allergy    Forearm laceration    right forearm   GERD (gastroesophageal reflux disease)    Hepatitis B    Hyperlipidemia    Pulmonary lesion    Left pulmonary schwannoma   Schwannoma    left lower lung    Past Surgical History:  Procedure Laterality Date   COLONOSCOPY     LIGAMENT REPAIR Right 04/19/2019   Right forearm   LIPOMA EXCISION Left    neck   LUNG SURGERY Left    OTHER SURGICAL HISTORY Left    pulmonary schwannoma excision   UPPER GASTROINTESTINAL ENDOSCOPY     WOUND EXPLORATION Right 04/19/2019   Procedure: WOUND EXPLORATION; REPAIR RIGHT FOREARM LACERATION;  Surgeon: Charlotte Crumb, MD;  Location: [facility];  Service: Orthopedics;  Laterality: Right;  AXILLARY BLOCK AND MAC    Allergies: Augmentin [amoxicillin-pot clavulanate]  Medications: Prior to Admission medications   Medication Sig Start Date End Date Taking? Authorizing Provider  cholecalciferol (VITAMIN D3) 25 MCG (1000 UNIT) tablet Take 1,000 Units by mouth daily.   Yes [provider]  famotidine (PEPCID) 20 MG tablet Take 1 tablet (20 mg total) by mouth at bedtime. 09/29/19  Yes Mauri Pole, MD  levothyroxine (SYNTHROID) 25 MCG tablet Take 1 tablet by mouth  daily in the morning on an empty stomach 06/23/21  Yes   Multiple Vitamin (MULTIVITAMIN) tablet Take 1 tablet by mouth daily.   Yes [provider]  Omega-3 Fatty Acids (FISH OIL) 1000 MG CAPS Take 1,000 mg by mouth daily.   Yes [provider]  pantoprazole (PROTONIX) 20 MG tablet Take 2 tablets (40 mg total) by mouth daily. 11/04/21  Yes Nandigam, Venia Minks, MD  vitamin C (ASCORBIC ACID) 250 MG tablet Take 250 mg by mouth daily.   Yes [provider]  acetaminophen (TYLENOL) 325 MG tablet Take 325 mg by mouth every 6 (six) hours as needed for moderate pain or headache.    [provider]  allopurinol (ZYLOPRIM) 100 MG tablet TAKE 1 TABLET BY MOUTH TWO TIMES DAILY 06/25/20 06/25/21  Brunetta Genera, MD  mupirocin ointment [facility]) 2 % Apply to affected area up to 3 times a day as needed Patient not taking: Reported on 08/22/2021 08/05/21   Allyn Kenner, MD  prochlorperazine (COMPAZINE) 10 MG tablet Take 1 tablet (10 mg total) by mouth every 6 (six) hours as needed (Nausea or vomiting). 07/04/20 01/31/21  Brunetta Genera, MD     Family History  Problem Relation Age of Onset   Macular degeneration Mother    Dementia Mother    Diverticulitis Mother    Prostate cancer Paternal Grandfather    Diverticulitis Brother    Colon cancer Neg Hx  Colon polyps Neg Hx    Esophageal cancer Neg Hx    Liver cancer Neg Hx    Pancreatic cancer Neg Hx    Rectal cancer Neg Hx    Stomach cancer Neg Hx     Social History   Socioeconomic History   Marital status: Married    Spouse name: Not on file   Number of children: Not on file   Years of education: Not on file   Highest education level: Not on file  Occupational History   Not on file  Tobacco Use   Smoking status: Never   Smokeless tobacco: Never  Vaping Use   Vaping Use: Never used  Substance and Sexual Activity   Alcohol use: No    Alcohol/week: 0.0 standard drinks of alcohol   Drug use: No    Sexual activity: Not on file  Other Topics Concern   Not on file  Social History Narrative   Not on file   Social Determinants of Health   Financial Resource Strain: Not on file  Food Insecurity: Not on file  Transportation Needs: Not on file  Physical Activity: Not on file  Stress: Not on file  Social Connections: Not on file    Review of Systems: A 12 point ROS discussed and pertinent positives are indicated in the HPI above.  All other systems are negative.  Vital Signs: BP (!) 165/91 (BP Location: Left Arm)   Pulse 73   Temp (!) 97.5 F (36.4 C) (Oral)   Resp 15   Ht _0  (1.753 m)   Wt 194 lb (88 kg)   SpO2 99%   BMI 28.65 kg/m   Physical Exam Vitals reviewed.  Constitutional:      General: He is not in acute distress.    Appearance: Normal appearance.  HENT:     Head: Normocephalic and atraumatic.     Mouth/Throat:     Mouth: Mucous membranes are moist.     Pharynx: Oropharynx is clear.  Eyes:     Extraocular Movements: Extraocular movements intact.     Conjunctiva/sclera: Conjunctivae normal.  Cardiovascular:     Rate and Rhythm: Normal rate and regular rhythm.  Pulmonary:     Effort: Pulmonary effort is normal.     Breath sounds: Normal breath sounds.  Abdominal:     Palpations: Abdomen is soft.  Skin:    General: Skin is warm and dry.  Neurological:     General: No focal deficit present.     Mental Status: He is alert and oriented to person, place, and time.  Psychiatric:        Mood and Affect: Mood normal.        Behavior: Behavior normal.     Imaging: No results found.  Labs:  CBC: Recent Labs    12/19/21 1151 02/23/22 1318 03/23/22 1604 04/07/22 0754  WBC 2.8* 2.1* 2.2* 2.7*  HGB 14.9 15.4 15.0 15.9  HCT 42.5 43.1 41.5 46.0  PLT 201 210 196 210    COAGS: No results for input(s): "INR", "APTT" in the last 8760 hours.  BMP: Recent Labs    11/04/21 1610 12/19/21 1151 02/23/22 1318 03/23/22 1604  NA 139 140 141 140  K  3.8 4.1 4.0 3.8  CL 108 105 108 106  CO2 _1 GLUCOSE 100* 88 101* 103*  BUN _2 CALCIUM 9.4 9.7 9.7 9.8  CREATININE 1.00 1.10 1.01 1.08  GFRNONAA >60 >60 >60 >  60    LIVER FUNCTION TESTS: Recent Labs    11/04/21 1610 12/19/21 1151 02/23/22 1318 03/23/22 1604  BILITOT 0.3 0.6 0.4 0.4  AST _0 ALT _1 ALKPHOS 103 100 82 76  PROT 6.8 7.1 6.7 7.2  ALBUMIN 4.2 4.6 4.5 4.7    TUMOR MARKERS: No results for input(s): "AFPTM", "CEA", "CA199", "CHROMGRNA" in the last 8760 hours.  Assessment and Plan:  Mantle Cell Lymphoma --ongoing neutropenia --referred for bone marrow biopsy --Labs, imaging, and history reviewed.  OK to proceed with planned BM biopsy with expected d/c later this morning.  Risks and benefits of bone marrow biopsy was discussed with the patient and/or patient's family including, but not limited to bleeding, infection, damage to adjacent structures or low yield requiring additional tests.  All of the questions were answered and there is agreement to proceed.  Consent signed and in chart.   Thank you for this interesting consult.  I greatly enjoyed meeting Jamarquis Crull and look forward to participating in their care.  A copy of this report was sent to the requesting provider on this date.  Electronically Signed: Pasty Spillers, PA 04/07/2022, 8:39 AM   I spent a total of 15 Minutes  in face to face in clinical consultation, greater than 50% of which was counseling/coordinating care for neutropenia

## 2022-04-07 NOTE — Discharge Instructions (Signed)
For questions /concerns may call Interventional Radiology at [phone] or  Interventional Radiology clinic [phone]   You may remove your dressing and shower tomorrow afternoon    Bone Marrow Aspiration and Bone Marrow Biopsy, Adult, Care After This sheet gives you information about how to care for yourself after your procedure. Your health care provider may also give you more specific instructions. If you have problems or questions, contact your health careprovider. What can I expect after the procedure? After the procedure, it is common to have: Mild pain and tenderness. Swelling. Bruising. Follow these instructions at home: Puncture site care Follow instructions from your health care provider about how to take care of the puncture site. Make sure you: Wash your hands with soap and water before and after you change your bandage (dressing). If soap and water are not available, use hand sanitizer. Change your dressing as told by your health care provider. Check your puncture site every day for signs of infection. Check for: More redness, swelling, or pain. Fluid or blood. Warmth. Pus or a bad smell.  Activity Return to your normal activities as told by your health care provider. Ask your health care provider what activities are safe for you. Do not lift anything that is heavier than 10 lb (4.5 kg), or the limit that you are told, until your health care provider says that it is safe. Do not drive for 24 hours if you were given a sedative during your procedure. General instructions Take over-the-counter and prescription medicines only as told by your health care provider. Do not take baths, swim, or use a hot tub until your health care provider approves. Ask your health care provider if you may take showers. You may only be allowed to take sponge baths. If directed, put ice on the affected area. To do this: Put ice in a plastic bag. Place a towel between your skin and the  bag. Leave the ice on for 20 minutes, 2-3 times a day. Keep all follow-up visits as told by your health care provider. This is important.  Contact a health care provider if: Your pain is not controlled with medicine. You have a fever. You have more redness, swelling, or pain around the puncture site. You have fluid or blood coming from the puncture site. Your puncture site feels warm to the touch. You have pus or a bad smell coming from the puncture site. Summary After the procedure, it is common to have mild pain, tenderness, swelling, and bruising. Follow instructions from your health care provider about how to take care of the puncture site and what activities are safe for you. Take over-the-counter and prescription medicines only as told by your health care provider. Contact a health care provider if you have any signs of infection, such as fluid or blood coming from the puncture site. This information is not intended to replace advice given to you by your health care provider. Make sure you discuss any questions you have with your healthcare provider.    Moderate Conscious Sedation, Adult, Care After This sheet gives you information about how to care for yourself after your procedure. Your health care provider may also give you more specific instructions. If you have problems or questions, contact your health careprovider. What can I expect after the procedure? After the procedure, it is common to have: Sleepiness for several hours. Impaired judgment for several hours. Difficulty with balance. Vomiting if you eat too soon. Follow these instructions at home: For the time period   you were told by your health care provider: Rest. Do not participate in activities where you could fall or become injured. Do not drive or use machinery. Do not drink alcohol. Do not take sleeping pills or medicines that cause drowsiness. Do not make important decisions or sign legal documents. Do not  take care of children on your own. Eating and drinking  Follow the diet recommended by your health care provider. Drink enough fluid to keep your urine pale yellow. If you vomit: Drink water, juice, or soup when you can drink without vomiting. Make sure you have little or no nausea before eating solid foods.  General instructions Take over-the-counter and prescription medicines only as told by your health care provider. Have a responsible adult stay with you for the time you are told. It is important to have someone help care for you until you are awake and alert. Do not smoke. Keep all follow-up visits as told by your health care provider. This is important. Contact a health care provider if: You are still sleepy or having trouble with balance after 24 hours. You feel light-headed. You keep feeling nauseous or you keep vomiting. You develop a rash. You have a fever. You have redness or swelling around the IV site. Get help right away if: You have trouble breathing. You have new-onset confusion at home. Summary After the procedure, it is common to feel sleepy, have impaired judgment, or feel nauseous if you eat too soon. Rest after you get home. Know the things you should not do after the procedure. Follow the diet recommended by your health care provider and drink enough fluid to keep your urine pale yellow. Get help right away if you have trouble breathing or new-onset confusion at home. This information is not intended to replace advice given to you by your health care provider. Make sure you discuss any questions you have with your healthcare provider. Document Revised: 01/05/2020 Document Reviewed: 08/03/2019 Elsevier Patient Education  2022 [employer]  

## 2022-04-07 NOTE — Procedures (Signed)
Interventional Radiology Procedure:   Indications: Mantle cell lymphoma with neutropenia  Procedure: CT guided bone marrow biopsy  Findings: 2 aspirates and 3 cores from right ilium  Complications: None     EBL: Minimal, less than 10 ml  Plan: Discharge to home in one hour.   Adam R. Henn, MD  Pager: [phone]    

## 2022-04-09 LAB — SURGICAL PATHOLOGY

## 2022-04-13 ENCOUNTER — Other Ambulatory Visit: Payer: Self-pay

## 2022-04-16 ENCOUNTER — Encounter (HOSPITAL_COMMUNITY): Payer: Self-pay | Admitting: Hematology

## 2022-04-17 ENCOUNTER — Inpatient Hospital Stay (HOSPITAL_BASED_OUTPATIENT_CLINIC_OR_DEPARTMENT_OTHER): Payer: 59 | Admitting: Hematology

## 2022-04-17 DIAGNOSIS — C8318 Mantle cell lymphoma, lymph nodes of multiple sites: Secondary | ICD-10-CM

## 2022-04-17 DIAGNOSIS — D709 Neutropenia, unspecified: Secondary | ICD-10-CM

## 2022-04-17 NOTE — Progress Notes (Signed)
HEMATOLOGY/ONCOLOGY PHONE VISIT NOTE  Date of Service: 04/17/2022  Patient Care Team: [name], MD as PCP - General (Family Medicine)  CHIEF COMPLAINTS/PURPOSE OF CONSULTATION:  Follow-up for continued evaluation and management of mantle cell lymphoma  HISTORY OF PRESENTING ILLNESS:  Please see previous note for details on initial presentation.  INTERVAL HISTORY: I connected with Corliss Parish on 04/17/2022 at 8:40 AM EST by telephone visit and verified that I am speaking with the correct person using two identifiers.   I discussed the limitations, risks, security and privacy concerns of performing an evaluation and management service by telemedicine and the availability of in-person appointments. I also discussed with the patient that there may be a patient responsible charge related to this service. The patient expressed understanding and agreed to proceed.   Other persons participating in the visit and their role in the encounter: None  Patient's location: Home Provider's location: [place]  Karam Dunson is a 66 y.o. male was contacted via phone for continued evaluation and management of his mantle cell lymphoma. He reports He is doing well with no new symptoms or concerns.  We discussed trying growth factor and just watching, trying growth factor and after watching go back to maintenance Rituxan or do nothing and continue to monitor. He will al [place] back with his decision.  No other new or acute symptoms.  Continues to have some neutropenia but has had no issues with infection issues.  No other acute toxicities from his last dose of Rituxan.  We discussed his biopsy done 04/07/2022.  Labs done 04/07/2022 were reviewed with the patient in detail.  MEDICAL HISTORY:  Past Medical History:  Diagnosis Date   Allergy    Forearm laceration    right forearm   GERD (gastroesophageal reflux disease)    Hepatitis B    Hyperlipidemia    Pulmonary lesion     Left pulmonary schwannoma   Schwannoma    left lower lung    SURGICAL HISTORY: Past Surgical History:  Procedure Laterality Date   COLONOSCOPY     LIGAMENT REPAIR Right 04/19/2019   Right forearm   LIPOMA EXCISION Left    neck   LUNG SURGERY Left    OTHER SURGICAL HISTORY Left    pulmonary schwannoma excision   UPPER GASTROINTESTINAL ENDOSCOPY     WOUND EXPLORATION Right 04/19/2019   Procedure: WOUND EXPLORATION; REPAIR RIGHT FOREARM LACERATION;  Surgeon: Charlotte Crumb, MD;  Location: [facility];  Service: Orthopedics;  Laterality: Right;  AXILLARY BLOCK AND MAC    SOCIAL HISTORY: Social History   Socioeconomic History   Marital status: Married    Spouse name: Not on file   Number of children: Not on file   Years of education: Not on file   Highest education level: Not on file  Occupational History   Not on file  Tobacco Use   Smoking status: Never   Smokeless tobacco: Never  Vaping Use   Vaping Use: Never used  Substance and Sexual Activity   Alcohol use: No    Alcohol/week: 0.0 standard drinks of alcohol   Drug use: No   Sexual activity: Not on file  Other Topics Concern   Not on file  Social History Narrative   Not on file   Social Determinants of Health   Financial Resource Strain: Not on file  Food Insecurity: Not on file  Transportation Needs: Not on file  Physical Activity: Not on file  Stress: Not  on file  Social Connections: Not on file  Intimate Partner Violence: Not on file    FAMILY HISTORY: Family History  Problem Relation Age of Onset   Macular degeneration Mother    Dementia Mother    Diverticulitis Mother    Prostate cancer Paternal Grandfather    Diverticulitis Brother    Colon cancer Neg Hx    Colon polyps Neg Hx    Esophageal cancer Neg Hx    Liver cancer Neg Hx    Pancreatic cancer Neg Hx    Rectal cancer Neg Hx    Stomach cancer Neg Hx     ALLERGIES:  is allergic to augmentin [amoxicillin-pot  clavulanate].  MEDICATIONS:  Current Outpatient Medications  Medication Sig Dispense Refill   acetaminophen (TYLENOL) 325 MG tablet Take 325 mg by mouth every 6 (six) hours as needed for moderate pain or headache.     allopurinol (ZYLOPRIM) 100 MG tablet TAKE 1 TABLET BY MOUTH TWO TIMES DAILY 60 tablet 0   cholecalciferol (VITAMIN D3) 25 MCG (1000 UNIT) tablet Take 1,000 Units by mouth daily.     famotidine (PEPCID) 20 MG tablet Take 1 tablet (20 mg total) by mouth at bedtime. 30 tablet 3   levothyroxine (SYNTHROID) 25 MCG tablet Take 1 tablet by mouth daily in the morning on an empty stomach 90 tablet 3   Multiple Vitamin (MULTIVITAMIN) tablet Take 1 tablet by mouth daily.     mupirocin ointment (BACTROBAN) 2 % Apply to affected area up to 3 times a day as needed (Patient not taking: Reported on 08/22/2021) 22 g PRN   Omega-3 Fatty Acids (FISH OIL) 1000 MG CAPS Take 1,000 mg by mouth daily.     pantoprazole (PROTONIX) 20 MG tablet Take 2 tablets (40 mg total) by mouth daily. 180 tablet 3   vitamin C (ASCORBIC ACID) 250 MG tablet Take 250 mg by mouth daily.     Current Facility-Administered Medications  Medication Dose Route Frequency Provider Last Rate Last Admin   0.9 %  sodium chloride infusion   Intravenous PRN Eileen Stanford, PA-C        REVIEW OF SYSTEMS:   10 Point review of Systems was done is negative except as noted above.  PHYSICAL EXAMINATION: Telemedicine appointment  LABORATORY DATA:  I have reviewed the data as listed  .    Latest Ref Rng & Units 04/07/2022    7:54 AM 03/23/2022    4:04 PM 02/23/2022    1:18 PM  CBC  WBC 4.0 - 10.5 K/uL 2.7  2.2  2.1   Hemoglobin 13.0 - 17.0 g/dL 15.9  15.0  15.4   Hematocrit 39.0 - 52.0 % 46.0  41.5  43.1   Platelets 150 - 400 K/uL 210  196  210    ANC 700 .    Latest Ref Rng & Units 03/23/2022    4:04 PM 02/23/2022    1:18 PM 12/19/2021   11:51 AM  CMP  Glucose 70 - 99 mg/dL 103  101  88   BUN 8 - 23 mg/dL _0 Creatinine 0.61 - 1.24 mg/dL 1.08  1.01  1.10   Sodium 135 - 145 mmol/L 140  141  140   Potassium 3.5 - 5.1 mmol/L 3.8  4.0  4.1   Chloride 98 - 111 mmol/L 106  108  105   CO2 22 - 32 mmol/L _1 Calcium 8.9 - 10.3  mg/dL 9.8  9.7  9.7   Total Protein 6.5 - 8.1 g/dL 7.2  6.7  7.1   Total Bilirubin 0.3 - 1.2 mg/dL 0.4  0.4  0.6   Alkaline Phos 38 - 126 U/L 76  82  100   AST 15 - 41 U/L _0 ALT 0 - 44 U/L _1 . Lab Results  Component Value Date   LDH 250 (H) 03/23/2022    06/20/2020 Right Axillary Surgical Pathology Report [id number]):    06/20/2020 Right Axillary Lymph Node Flow Pathology ([id number]):   06/11/2020 Flow Pathology ([id number]):   RADIOGRAPHIC STUDIES: I have personally reviewed the radiological images as listed and agreed with the findings in the report. CT BONE MARROW BIOPSY & ASPIRATION  Result Date: 04/07/2022 INDICATION: 66 year old with neutropenia and history of mantle cell lymphoma. EXAM: CT GUIDED BONE MARROW ASPIRATES AND BIOPSY Physician: Stephan Minister. Henn, MD MEDICATIONS: Moderate sedation ANESTHESIA/SEDATION: Moderate (conscious) sedation was employed during this procedure. A total of Versed 3.88m and fentanyl 100 mcg was administered intravenously at the order of the provider performing the procedure. Total intra-service moderate sedation time: 13 minutes. Patient's level of consciousness and vital signs were monitored continuously by radiology nurse throughout the procedure under the supervision of the provider performing the procedure. COMPLICATIONS: None immediate. PROCEDURE: The procedure was explained to the patient. The risks and benefits of the procedure were discussed and the patient's questions were addressed. Informed consent was obtained from the patient. The patient was placed prone on CT table. Images of the pelvis were obtained. The right side of back was prepped and draped in sterile fashion. The skin and right  posterior ilium were anesthetized with 1% lidocaine. 11 gauge bone needle was directed into the right ilium with CT guidance. Two aspirates and 3 core biopsies were obtained. Bandage placed over the puncture site. RADIATION DOSE REDUCTION: This exam was performed according to the departmental dose-optimization program which includes automated exposure control, adjustment of the mA and/or kV according to patient size and/or use of iterative reconstruction technique. FINDINGS: Biopsy needle confirmed within the posterior right ilium. IMPRESSION: CT guided bone marrow aspiration and core biopsy. Electronically Signed   By: AMarkus DaftM.D.   On: 04/07/2022 11:55   CT Biopsy  Result Date: 04/07/2022 INDICATION: 66year old with neutropenia and history of mantle cell lymphoma. EXAM: CT GUIDED BONE MARROW ASPIRATES AND BIOPSY Physician: AStephan Minister Henn, MD MEDICATIONS: Moderate sedation ANESTHESIA/SEDATION: Moderate (conscious) sedation was employed during this procedure. A total of Versed 3.080mand fentanyl 100 mcg was administered intravenously at the order of the provider performing the procedure. Total intra-service moderate sedation time: 13 minutes. Patient's level of consciousness and vital signs were monitored continuously by radiology nurse throughout the procedure under the supervision of the provider performing the procedure. COMPLICATIONS: None immediate. PROCEDURE: The procedure was explained to the patient. The risks and benefits of the procedure were discussed and the patient's questions were addressed. Informed consent was obtained from the patient. The patient was placed prone on CT table. Images of the pelvis were obtained. The right side of back was prepped and draped in sterile fashion. The skin and right posterior ilium were anesthetized with 1% lidocaine. 11 gauge bone needle was directed into the right ilium with CT guidance. Two aspirates and 3 core biopsies were obtained. Bandage placed over the  puncture site. RADIATION DOSE REDUCTION: This exam was performed according to the departmental  dose-optimization program which includes automated exposure control, adjustment of the mA and/or kV according to patient size and/or use of iterative reconstruction technique. FINDINGS: Biopsy needle confirmed within the posterior right ilium. IMPRESSION: CT guided bone marrow aspiration and core biopsy. Electronically Signed   By: Markus Daft M.D.   On: 04/07/2022 11:55    ASSESSMENT & PLAN:   66 yo with   1) stage IV mantle cell lymphoma with extensive lymphadenopathy and massive splenomegaly . 2) s/p massive splenomegaly likely related to mantle cell lymphoma  3) thrombocytopenia-mild platelets of 210k likely related to lymphoma. - resolved 4) hepatitis B core antibody positive, surface antigen negative, HBV DNA PCR negative 5) Neutropenia - intermitent fluctuating-- likely from  immunologic changes from Rituxan use. No fevers.  PLAN: -Labs done today were reviewed with the patient in detail. CBC shows hemoglobin of 15.4, WBC count of 2.7k and platelets of 210k. Severe neutropenia with ANC of 0.3k. -No lab findings or clinical symptom suggestive of lymphoma recurrence/progression at this time. -Continue follow-up with primary care physician for management of other medical comorbidities. -We discussed trying growth factor and just watching, trying growth factor and after watching go back to maintenance Rituxan or do nothing and continue to monitor. He will al [place] back with his decision. -We discussed his biopsy done 04/07/2022 which revealed an increased number of hematogones.  FOLLOW UP: ***  .The total time spent in the appointment was *** minutes* .  All of the patient's questions were answered with apparent satisfaction. The patient knows to call the clinic with any problems, questions or concerns.   Sullivan Lone MD MS AAHIVMS [facility] Hematology/Oncology Physician [facility]  .*Total Encounter Time as defined by the Centers for Medicare and Medicaid Services includes, in addition to the face-to-face time of a patient visit (documented in the note above) non-face-to-face time: obtaining and reviewing outside history, ordering and reviewing medications, tests or procedures, care coordination (communications with other health care professionals or caregivers) and documentation in the medical record.  I, Melene Muller, am acting as scribe for Dr. Sullivan Lone, MD.

## 2022-04-20 LAB — SURGICAL PATHOLOGY

## 2022-04-23 ENCOUNTER — Encounter: Payer: Self-pay | Admitting: Hematology

## 2022-04-24 ENCOUNTER — Inpatient Hospital Stay: Payer: 59

## 2022-04-24 ENCOUNTER — Other Ambulatory Visit: Payer: Self-pay

## 2022-04-24 ENCOUNTER — Inpatient Hospital Stay: Payer: 59 | Attending: Hematology | Admitting: Hematology

## 2022-04-24 VITALS — BP 149/92 | HR 79 | Temp 97.9°F | Ht 69.0 in | Wt 192.3 lb

## 2022-04-24 DIAGNOSIS — Z8042 Family history of malignant neoplasm of prostate: Secondary | ICD-10-CM | POA: Insufficient documentation

## 2022-04-24 DIAGNOSIS — C8318 Mantle cell lymphoma, lymph nodes of multiple sites: Secondary | ICD-10-CM | POA: Insufficient documentation

## 2022-04-24 DIAGNOSIS — T451X5A Adverse effect of antineoplastic and immunosuppressive drugs, initial encounter: Secondary | ICD-10-CM | POA: Diagnosis not present

## 2022-04-24 DIAGNOSIS — D701 Agranulocytosis secondary to cancer chemotherapy: Secondary | ICD-10-CM | POA: Diagnosis not present

## 2022-04-24 DIAGNOSIS — D709 Neutropenia, unspecified: Secondary | ICD-10-CM | POA: Diagnosis not present

## 2022-04-24 LAB — CMP (CANCER CENTER ONLY)
ALT: 25 U/L (ref 0–44)
AST: 29 U/L (ref 15–41)
Albumin: 4.7 g/dL (ref 3.5–5.0)
Alkaline Phosphatase: 82 U/L (ref 38–126)
Anion gap: 6 (ref 5–15)
BUN: 19 mg/dL (ref 8–23)
CO2: 27 mmol/L (ref 22–32)
Calcium: 9.3 mg/dL (ref 8.9–10.3)
Chloride: 108 mmol/L (ref 98–111)
Creatinine: 1.12 mg/dL (ref 0.61–1.24)
GFR, Estimated: >60 mL/min (ref >60)
Glucose, Bld: 95 mg/dL (ref 70–99)
Potassium: 3.8 mmol/L (ref 3.5–5.1)
Sodium: 141 mmol/L (ref 135–145)
Total Bilirubin: 0.5 mg/dL (ref 0.3–1.2)
Total Protein: 7.1 g/dL (ref 6.5–8.1)

## 2022-04-24 LAB — CBC WITH DIFFERENTIAL (CANCER CENTER ONLY)
Abs Immature Granulocytes: 0.01 10*3/uL (ref 0.00–0.07)
Basophils Absolute: 0.1 10*3/uL (ref 0.0–0.1)
Basophils Relative: 2 %
Eosinophils Absolute: 0.1 10*3/uL (ref 0.0–0.5)
Eosinophils Relative: 3 %
HCT: 41.9 % (ref 39.0–52.0)
Hemoglobin: 15.2 g/dL (ref 13.0–17.0)
Immature Granulocytes: 1 %
Lymphocytes Relative: 54 %
Lymphs Abs: 1.2 10*3/uL (ref 0.7–4.0)
MCH: 31.5 pg (ref 26.0–34.0)
MCHC: 36.3 g/dL — ABNORMAL HIGH (ref 30.0–36.0)
MCV: 86.9 fL (ref 80.0–100.0)
Monocytes Absolute: 0.5 10*3/uL (ref 0.1–1.0)
Monocytes Relative: 24 %
Neutro Abs: 0.4 10*3/uL — CL (ref 1.7–7.7)
Neutrophils Relative %: 16 %
Platelet Count: 198 10*3/uL (ref 150–400)
RBC: 4.82 MIL/uL (ref 4.22–5.81)
RDW: 12.1 % (ref 11.5–15.5)
WBC Count: 2.1 10*3/uL — ABNORMAL LOW (ref 4.0–10.5)
nRBC: 0 % (ref 0.0–0.2)

## 2022-04-24 LAB — LACTATE DEHYDROGENASE: LDH: 252 U/L — ABNORMAL HIGH (ref 98–192)

## 2022-04-24 LAB — VITAMIN B12: Vitamin B-12: 758 pg/mL (ref 180–914)

## 2022-04-25 ENCOUNTER — Other Ambulatory Visit: Payer: Self-pay

## 2022-04-29 ENCOUNTER — Encounter: Payer: Self-pay | Admitting: Hematology

## 2022-05-01 ENCOUNTER — Other Ambulatory Visit (HOSPITAL_COMMUNITY): Payer: Self-pay

## 2022-05-01 ENCOUNTER — Encounter: Payer: Self-pay | Admitting: Hematology

## 2022-05-01 MED ORDER — LEVOTHYROXINE SODIUM 25 MCG PO TABS
ORAL_TABLET | ORAL | 0 refills | Status: DC
  Filled 2022-05-01 – 2022-05-23 (×3): qty 90, 90d supply, fill #0

## 2022-05-01 NOTE — Progress Notes (Signed)
HEMATOLOGY/ONCOLOGY CLINIC VISIT NOTE  Date of Service: 05/01/2022  Patient Care Team: [name], MD as PCP - General (Family Medicine)  CHIEF COMPLAINTS/PURPOSE OF CONSULTATION:  Follow-up for continued evaluation and management of mantle cell lymphoma  HISTORY OF PRESENTING ILLNESS:  Please see previous note for details on initial presentation.  INTERVAL HISTORY:  Brian Kaiser is here for follow-up of his mantle cell lymphoma and prior to his neck cycle of maintenance Rituxan.  He is accompanied by his wife.  We talked on the phone with previous labs about 7 to 10 days ago.  Labs today show that he is still fairly neutropenic.  Last time we met with talked about different ways to manage his Rituxan related neutropenia including continuing Rituxan without any interventions, consideration of growth factor to try to correct his neutropenia prior to restarting maintenance Rituxan versus continue to monitor without Rituxan. After long discussion patient is unclear with how he would like to proceed and he would like to get an additional opinion regarding this question.  We discussed giving him a referral to [facility] which she is agreeable with.  MEDICAL HISTORY:  Past Medical History:  Diagnosis Date   Allergy    Forearm laceration    right forearm   GERD (gastroesophageal reflux disease)    Hepatitis B    Hyperlipidemia    Pulmonary lesion    Left pulmonary schwannoma   Schwannoma    left lower lung    SURGICAL HISTORY: Past Surgical History:  Procedure Laterality Date   COLONOSCOPY     LIGAMENT REPAIR Right 04/19/2019   Right forearm   LIPOMA EXCISION Left    neck   LUNG SURGERY Left    OTHER SURGICAL HISTORY Left    pulmonary schwannoma excision   UPPER GASTROINTESTINAL ENDOSCOPY     WOUND EXPLORATION Right 04/19/2019   Procedure: WOUND EXPLORATION; REPAIR RIGHT FOREARM LACERATION;  Surgeon: Charlotte Crumb, MD;  Location: [place];  Service:  Orthopedics;  Laterality: Right;  AXILLARY BLOCK AND MAC    SOCIAL HISTORY: Social History   Socioeconomic History   Marital status: Married    Spouse name: Not on file   Number of children: Not on file   Years of education: Not on file   Highest education level: Not on file  Occupational History   Not on file  Tobacco Use   Smoking status: Never   Smokeless tobacco: Never  Vaping Use   Vaping Use: Never used  Substance and Sexual Activity   Alcohol use: No    Alcohol/week: 0.0 standard drinks of alcohol   Drug use: No   Sexual activity: Not on file  Other Topics Concern   Not on file  Social History Narrative   Not on file   Social Determinants of Health   Financial Resource Strain: Not on file  Food Insecurity: Not on file  Transportation Needs: Not on file  Physical Activity: Not on file  Stress: Not on file  Social Connections: Not on file  Intimate Partner Violence: Not on file    FAMILY HISTORY: Family History  Problem Relation Age of Onset   Macular degeneration Mother    Dementia Mother    Diverticulitis Mother    Prostate cancer Paternal Grandfather    Diverticulitis Brother    Colon cancer Neg Hx    Colon polyps Neg Hx    Esophageal cancer Neg Hx    Liver cancer Neg Hx    Pancreatic cancer  Neg Hx    Rectal cancer Neg Hx    Stomach cancer Neg Hx     ALLERGIES:  is allergic to augmentin [amoxicillin-pot clavulanate].  MEDICATIONS:  Current Outpatient Medications  Medication Sig Dispense Refill   acetaminophen (TYLENOL) 325 MG tablet Take 325 mg by mouth every 6 (six) hours as needed for moderate pain or headache.     cholecalciferol (VITAMIN D3) 25 MCG (1000 UNIT) tablet Take 1,000 Units by mouth daily.     famotidine (PEPCID) 20 MG tablet Take 1 tablet (20 mg total) by mouth at bedtime. 30 tablet 3   levothyroxine (SYNTHROID) 25 MCG tablet Take 1 tablet by mouth daily in the morning on an empty stomach 90 tablet 3   Multiple Vitamin  (MULTIVITAMIN) tablet Take 1 tablet by mouth daily.     Omega-3 Fatty Acids (FISH OIL) 1000 MG CAPS Take 1,000 mg by mouth daily.     pantoprazole (PROTONIX) 20 MG tablet Take 2 tablets (40 mg total) by mouth daily. 180 tablet 3   vitamin C (ASCORBIC ACID) 250 MG tablet Take 250 mg by mouth daily.     allopurinol (ZYLOPRIM) 100 MG tablet TAKE 1 TABLET BY MOUTH TWO TIMES DAILY 60 tablet 0   mupirocin ointment (BACTROBAN) 2 % Apply to affected area up to 3 times a day as needed 22 g PRN   Current Facility-Administered Medications  Medication Dose Route Frequency Provider Last Rate Last Admin   0.9 %  sodium chloride infusion   Intravenous PRN Eileen Stanford, PA-C        REVIEW OF SYSTEMS:   10 Point review of Systems was done is negative except as noted above.  PHYSICAL EXAMINATION: Telemedicine appointment  LABORATORY DATA:  .BP (!) 149/92 (BP Location: Left Arm, Patient Position: Sitting)   Pulse 79   Temp 97.9 F (36.6 C) (Oral)   Ht _0  (1.753 m)   Wt 192 lb 4.8 oz (87.2 kg)   SpO2 100%   BMI 28.40 kg/m  . GENERAL:alert, in no acute distress and comfortable SKIN: no acute rashes, no significant lesions EYES: conjunctiva are pink and non-injected, sclera anicteric OROPHARYNX: MMM, no exudates, no oropharyngeal erythema or ulceration NECK: supple, no JVD LYMPH:  no palpable lymphadenopathy in the cervical, axillary or inguinal regions LUNGS: clear to auscultation b/l with normal respiratory effort HEART: regular rate & rhythm ABDOMEN:  normoactive bowel sounds , non tender, not distended. Extremity: no pedal edema PSYCH: alert & oriented x 3 with fluent speech NEURO: no focal motor/sensory deficits   .    Latest Ref Rng & Units 04/24/2022    1:06 PM 04/07/2022    7:54 AM 03/23/2022    4:04 PM  CBC  WBC 4.0 - 10.5 K/uL 2.1  2.7  2.2   Hemoglobin 13.0 - 17.0 g/dL 15.2  15.9  15.0   Hematocrit 39.0 - 52.0 % 41.9  46.0  41.5   Platelets 150 - 400 K/uL 198  210  196     ANC 300 .    Latest Ref Rng & Units 04/24/2022    1:06 PM 03/23/2022    4:04 PM 02/23/2022    1:18 PM  CMP  Glucose 70 - 99 mg/dL 95  103  101   BUN 8 - 23 mg/dL _1 Creatinine 0.61 - 1.24 mg/dL 1.12  1.08  1.01   Sodium 135 - 145 mmol/L 141  140  141   Potassium  3.5 - 5.1 mmol/L 3.8  3.8  4.0   Chloride 98 - 111 mmol/L 108  106  108   CO2 22 - 32 mmol/L _0 Calcium 8.9 - 10.3 mg/dL 9.3  9.8  9.7   Total Protein 6.5 - 8.1 g/dL 7.1  7.2  6.7   Total Bilirubin 0.3 - 1.2 mg/dL 0.5  0.4  0.4   Alkaline Phos 38 - 126 U/L 82  76  82   AST 15 - 41 U/L _1 ALT 0 - 44 U/L _2 . Lab Results  Component Value Date   LDH 252 (H) 04/24/2022    06/20/2020 Right Axillary Surgical Pathology Report [id number]):    06/20/2020 Right Axillary Lymph Node Flow Pathology ([id number]):   06/11/2020 Flow Pathology ([id number]):   RADIOGRAPHIC STUDIES: I have personally reviewed the radiological images as listed and agreed with the findings in the report. CT BONE MARROW BIOPSY & ASPIRATION  Result Date: 04/07/2022 INDICATION: 66 year old with neutropenia and history of mantle cell lymphoma. EXAM: CT GUIDED BONE MARROW ASPIRATES AND BIOPSY Physician: Stephan Minister. Henn, MD MEDICATIONS: Moderate sedation ANESTHESIA/SEDATION: Moderate (conscious) sedation was employed during this procedure. A total of Versed 3.80m and fentanyl 100 mcg was administered intravenously at the order of the provider performing the procedure. Total intra-service moderate sedation time: 13 minutes. Patient's level of consciousness and vital signs were monitored continuously by radiology nurse throughout the procedure under the supervision of the provider performing the procedure. COMPLICATIONS: None immediate. PROCEDURE: The procedure was explained to the patient. The risks and benefits of the procedure were discussed and the patient's questions were addressed. Informed consent was obtained  from the patient. The patient was placed prone on CT table. Images of the pelvis were obtained. The right side of back was prepped and draped in sterile fashion. The skin and right posterior ilium were anesthetized with 1% lidocaine. 11 gauge bone needle was directed into the right ilium with CT guidance. Two aspirates and 3 core biopsies were obtained. Bandage placed over the puncture site. RADIATION DOSE REDUCTION: This exam was performed according to the departmental dose-optimization program which includes automated exposure control, adjustment of the mA and/or kV according to patient size and/or use of iterative reconstruction technique. FINDINGS: Biopsy needle confirmed within the posterior right ilium. IMPRESSION: CT guided bone marrow aspiration and core biopsy. Electronically Signed   By: AMarkus DaftM.D.   On: 04/07/2022 11:55   CT Biopsy  Result Date: 04/07/2022 INDICATION: 66year old with neutropenia and history of mantle cell lymphoma. EXAM: CT GUIDED BONE MARROW ASPIRATES AND BIOPSY Physician: AStephan Minister Henn, MD MEDICATIONS: Moderate sedation ANESTHESIA/SEDATION: Moderate (conscious) sedation was employed during this procedure. A total of Versed 3.060mand fentanyl 100 mcg was administered intravenously at the order of the provider performing the procedure. Total intra-service moderate sedation time: 13 minutes. Patient's level of consciousness and vital signs were monitored continuously by radiology nurse throughout the procedure under the supervision of the provider performing the procedure. COMPLICATIONS: None immediate. PROCEDURE: The procedure was explained to the patient. The risks and benefits of the procedure were discussed and the patient's questions were addressed. Informed consent was obtained from the patient. The patient was placed prone on CT table. Images of the pelvis were obtained. The right side of back was prepped and draped in sterile fashion. The skin and right posterior ilium  were anesthetized with 1%  lidocaine. 11 gauge bone needle was directed into the right ilium with CT guidance. Two aspirates and 3 core biopsies were obtained. Bandage placed over the puncture site. RADIATION DOSE REDUCTION: This exam was performed according to the departmental dose-optimization program which includes automated exposure control, adjustment of the mA and/or kV according to patient size and/or use of iterative reconstruction technique. FINDINGS: Biopsy needle confirmed within the posterior right ilium. IMPRESSION: CT guided bone marrow aspiration and core biopsy. Electronically Signed   By: Markus Daft M.D.   On: 04/07/2022 11:55    ASSESSMENT & PLAN:   66 yo with   1) stage IV mantle cell lymphoma with extensive lymphadenopathy and massive splenomegaly . 2) s/p massive splenomegaly likely related to mantle cell lymphoma  3) Thrombocytopenia-mild platelets of 210k likely related to lymphoma. - resolved 4) hepatitis B core antibody positive, surface antigen negative, HBV DNA PCR negative 5) Neutropenia - intermitent fluctuating-- likely from  immunologic changes from Rituxan use. No fevers.  PLAN: -Labs done today were discussed in detail with the patient -Recent bone marrow biopsy results were again discussed with the patient.  Showed no evidence of vascular or mantle cell lymphoma or other overt pathology explaining the patient's neutropenia. -Patient wants to hold off on growth factor support and wants to hold off on continuing his maintenance Rituxan at this time. -We shall refer him to Dr. Caprice Beaver at [facility] for second opinion regarding his persistent neutropenia while on maintenance Rituxan.   FOLLOW UP: F/u based on patients decision  The total time spent in the appointment was 25 minutes*.  All of the patient's questions were answered with apparent satisfaction. The patient knows to call the clinic with any problems, questions or concerns.   Brian Lone MD MS AAHIVMS [facility]  [facility] Hematology/Oncology Physician [facility]  .*Total Encounter Time as defined by the Centers for Medicare and Medicaid Services includes, in addition to the face-to-face time of a patient visit (documented in the note above) non-face-to-face time: obtaining and reviewing outside history, ordering and reviewing medications, tests or procedures, care coordination (communications with other health care professionals or caregivers) and documentation in the medical record.

## 2022-05-04 ENCOUNTER — Other Ambulatory Visit: Payer: Self-pay

## 2022-05-04 DIAGNOSIS — C8318 Mantle cell lymphoma, lymph nodes of multiple sites: Secondary | ICD-10-CM

## 2022-05-04 NOTE — Progress Notes (Signed)
Referral faxed to Dr. Burna Cash [facility] for second opinion

## 2022-05-11 ENCOUNTER — Other Ambulatory Visit (HOSPITAL_COMMUNITY): Payer: Self-pay

## 2022-05-14 DIAGNOSIS — C8314 Mantle cell lymphoma, lymph nodes of axilla and upper limb: Secondary | ICD-10-CM | POA: Diagnosis not present

## 2022-05-14 DIAGNOSIS — R591 Generalized enlarged lymph nodes: Secondary | ICD-10-CM | POA: Diagnosis not present

## 2022-05-19 ENCOUNTER — Other Ambulatory Visit (HOSPITAL_COMMUNITY): Payer: Self-pay

## 2022-05-23 ENCOUNTER — Other Ambulatory Visit (HOSPITAL_COMMUNITY): Payer: Self-pay

## 2022-05-27 ENCOUNTER — Other Ambulatory Visit (HOSPITAL_COMMUNITY): Payer: Self-pay

## 2022-05-27 DIAGNOSIS — Z79899 Other long term (current) drug therapy: Secondary | ICD-10-CM | POA: Diagnosis not present

## 2022-05-27 DIAGNOSIS — Z23 Encounter for immunization: Secondary | ICD-10-CM | POA: Diagnosis not present

## 2022-05-27 DIAGNOSIS — Z79620 Long term (current) use of immunosuppressive biologic: Secondary | ICD-10-CM | POA: Diagnosis not present

## 2022-05-27 DIAGNOSIS — T50B95A Adverse effect of other viral vaccines, initial encounter: Secondary | ICD-10-CM | POA: Diagnosis not present

## 2022-05-27 DIAGNOSIS — C8310 Mantle cell lymphoma, unspecified site: Secondary | ICD-10-CM | POA: Diagnosis not present

## 2022-05-27 DIAGNOSIS — D702 Other drug-induced agranulocytosis: Secondary | ICD-10-CM | POA: Diagnosis not present

## 2022-05-27 MED ORDER — PREDNISONE 10 MG PO TABS
ORAL_TABLET | ORAL | 0 refills | Status: DC
  Filled 2022-05-27: qty 119, 28d supply, fill #0

## 2022-06-02 ENCOUNTER — Other Ambulatory Visit: Payer: Self-pay

## 2022-06-05 ENCOUNTER — Other Ambulatory Visit: Payer: Self-pay

## 2022-06-05 ENCOUNTER — Inpatient Hospital Stay: Payer: 59 | Attending: Hematology

## 2022-06-05 DIAGNOSIS — D709 Neutropenia, unspecified: Secondary | ICD-10-CM | POA: Insufficient documentation

## 2022-06-05 DIAGNOSIS — C8318 Mantle cell lymphoma, lymph nodes of multiple sites: Secondary | ICD-10-CM

## 2022-06-05 DIAGNOSIS — Z8572 Personal history of non-Hodgkin lymphomas: Secondary | ICD-10-CM | POA: Insufficient documentation

## 2022-06-05 LAB — CMP (CANCER CENTER ONLY)
ALT: 26 U/L (ref 0–44)
AST: 25 U/L (ref 15–41)
Albumin: 3.9 g/dL (ref 3.5–5.0)
Alkaline Phosphatase: 90 U/L (ref 38–126)
Anion gap: 7 (ref 5–15)
BUN: 23 mg/dL (ref 8–23)
CO2: 26 mmol/L (ref 22–32)
Calcium: 9.4 mg/dL (ref 8.9–10.3)
Chloride: 108 mmol/L (ref 98–111)
Creatinine: 1.13 mg/dL (ref 0.61–1.24)
GFR, Estimated: >60 mL/min (ref >60)
Glucose, Bld: 126 mg/dL — ABNORMAL HIGH (ref 70–99)
Potassium: 3.1 mmol/L — ABNORMAL LOW (ref 3.5–5.1)
Sodium: 141 mmol/L (ref 135–145)
Total Bilirubin: 1 mg/dL (ref 0.3–1.2)
Total Protein: 6.7 g/dL (ref 6.5–8.1)

## 2022-06-05 LAB — CBC WITH DIFFERENTIAL (CANCER CENTER ONLY)
Abs Immature Granulocytes: 0 10*3/uL (ref 0.00–0.07)
Basophils Absolute: 0 10*3/uL (ref 0.0–0.1)
Basophils Relative: 1 %
Eosinophils Absolute: 0 10*3/uL (ref 0.0–0.5)
Eosinophils Relative: 1 %
HCT: 42.9 % (ref 39.0–52.0)
Hemoglobin: 15.3 g/dL (ref 13.0–17.0)
Immature Granulocytes: 0 %
Lymphocytes Relative: 57 %
Lymphs Abs: 1.1 10*3/uL (ref 0.7–4.0)
MCH: 31.2 pg (ref 26.0–34.0)
MCHC: 35.7 g/dL (ref 30.0–36.0)
MCV: 87.6 fL (ref 80.0–100.0)
Monocytes Absolute: 0.4 10*3/uL (ref 0.1–1.0)
Monocytes Relative: 20 %
Neutro Abs: 0.4 10*3/uL — CL (ref 1.7–7.7)
Neutrophils Relative %: 21 %
Platelet Count: 210 10*3/uL (ref 150–400)
RBC: 4.9 MIL/uL (ref 4.22–5.81)
RDW: 12.4 % (ref 11.5–15.5)
WBC Count: 2 10*3/uL — ABNORMAL LOW (ref 4.0–10.5)
nRBC: 0 % (ref 0.0–0.2)

## 2022-06-05 LAB — HIV ANTIBODY (ROUTINE TESTING W REFLEX): HIV Screen 4th Generation wRfx: NONREACTIVE

## 2022-06-05 LAB — RPR: RPR Ser Ql: NONREACTIVE

## 2022-06-05 LAB — LACTATE DEHYDROGENASE: LDH: 254 U/L — ABNORMAL HIGH (ref 98–192)

## 2022-06-09 ENCOUNTER — Other Ambulatory Visit: Payer: Self-pay

## 2022-06-09 ENCOUNTER — Encounter: Payer: Self-pay | Admitting: Hematology

## 2022-06-10 LAB — VITAMIN C: Vitamin C: 1.6 mg/dL (ref 0.4–2.0)

## 2022-06-10 LAB — COPPER, SERUM: Copper: 89 ug/dL (ref 69–132)

## 2022-06-18 ENCOUNTER — Other Ambulatory Visit: Payer: Self-pay

## 2022-06-18 DIAGNOSIS — C8318 Mantle cell lymphoma, lymph nodes of multiple sites: Secondary | ICD-10-CM

## 2022-06-19 ENCOUNTER — Other Ambulatory Visit: Payer: Self-pay

## 2022-06-19 ENCOUNTER — Inpatient Hospital Stay: Payer: 59

## 2022-06-19 ENCOUNTER — Other Ambulatory Visit: Payer: Self-pay | Admitting: Hematology

## 2022-06-19 DIAGNOSIS — D709 Neutropenia, unspecified: Secondary | ICD-10-CM | POA: Diagnosis not present

## 2022-06-19 DIAGNOSIS — C8318 Mantle cell lymphoma, lymph nodes of multiple sites: Secondary | ICD-10-CM

## 2022-06-19 DIAGNOSIS — Z8572 Personal history of non-Hodgkin lymphomas: Secondary | ICD-10-CM | POA: Diagnosis not present

## 2022-06-19 LAB — CMP (CANCER CENTER ONLY)
ALT: 34 U/L (ref 0–44)
AST: 24 U/L (ref 15–41)
Albumin: 4.5 g/dL (ref 3.5–5.0)
Alkaline Phosphatase: 158 U/L — ABNORMAL HIGH (ref 38–126)
Anion gap: 4 — ABNORMAL LOW (ref 5–15)
BUN: 18 mg/dL (ref 8–23)
CO2: 32 mmol/L (ref 22–32)
Calcium: 9.6 mg/dL (ref 8.9–10.3)
Chloride: 104 mmol/L (ref 98–111)
Creatinine: 1.19 mg/dL (ref 0.61–1.24)
GFR, Estimated: >60 mL/min (ref >60)
Glucose, Bld: 106 mg/dL — ABNORMAL HIGH (ref 70–99)
Potassium: 3.9 mmol/L (ref 3.5–5.1)
Sodium: 140 mmol/L (ref 135–145)
Total Bilirubin: 0.6 mg/dL (ref 0.3–1.2)
Total Protein: 6.9 g/dL (ref 6.5–8.1)

## 2022-06-19 LAB — CBC WITH DIFFERENTIAL (CANCER CENTER ONLY)
Abs Immature Granulocytes: 0 10*3/uL (ref 0.00–0.07)
Band Neutrophils: 6 %
Basophils Absolute: 0 10*3/uL (ref 0.0–0.1)
Basophils Relative: 1 %
Eosinophils Absolute: 0 10*3/uL (ref 0.0–0.5)
Eosinophils Relative: 1 %
HCT: 44.2 % (ref 39.0–52.0)
Hemoglobin: 15.7 g/dL (ref 13.0–17.0)
Lymphocytes Relative: 44 %
Lymphs Abs: 0.8 10*3/uL (ref 0.7–4.0)
MCH: 31.8 pg (ref 26.0–34.0)
MCHC: 35.5 g/dL (ref 30.0–36.0)
MCV: 89.5 fL (ref 80.0–100.0)
Metamyelocytes Relative: 2 %
Monocytes Absolute: 0.4 10*3/uL (ref 0.1–1.0)
Monocytes Relative: 21 %
Neutro Abs: 0.6 10*3/uL — ABNORMAL LOW (ref 1.7–7.7)
Neutrophils Relative %: 25 %
Platelet Count: 209 10*3/uL (ref 150–400)
RBC: 4.94 MIL/uL (ref 4.22–5.81)
RDW: 12.3 % (ref 11.5–15.5)
Smear Review: NORMAL
WBC Count: 1.9 10*3/uL — ABNORMAL LOW (ref 4.0–10.5)
nRBC: 0 % (ref 0.0–0.2)

## 2022-06-19 LAB — LACTATE DEHYDROGENASE: LDH: 345 U/L — ABNORMAL HIGH (ref 98–192)

## 2022-06-23 DIAGNOSIS — E039 Hypothyroidism, unspecified: Secondary | ICD-10-CM | POA: Diagnosis not present

## 2022-06-23 DIAGNOSIS — C8318 Mantle cell lymphoma, lymph nodes of multiple sites: Secondary | ICD-10-CM | POA: Diagnosis not present

## 2022-06-23 DIAGNOSIS — Z0000 Encounter for general adult medical examination without abnormal findings: Secondary | ICD-10-CM | POA: Diagnosis not present

## 2022-06-23 DIAGNOSIS — A630 Anogenital (venereal) warts: Secondary | ICD-10-CM | POA: Diagnosis not present

## 2022-06-23 DIAGNOSIS — I10 Essential (primary) hypertension: Secondary | ICD-10-CM | POA: Diagnosis not present

## 2022-06-23 DIAGNOSIS — Z125 Encounter for screening for malignant neoplasm of prostate: Secondary | ICD-10-CM | POA: Diagnosis not present

## 2022-06-23 DIAGNOSIS — Z85820 Personal history of malignant melanoma of skin: Secondary | ICD-10-CM | POA: Diagnosis not present

## 2022-06-23 DIAGNOSIS — K219 Gastro-esophageal reflux disease without esophagitis: Secondary | ICD-10-CM | POA: Diagnosis not present

## 2022-06-23 DIAGNOSIS — E781 Pure hyperglyceridemia: Secondary | ICD-10-CM | POA: Diagnosis not present

## 2022-06-24 ENCOUNTER — Other Ambulatory Visit: Payer: Self-pay

## 2022-06-24 ENCOUNTER — Other Ambulatory Visit (HOSPITAL_COMMUNITY): Payer: Self-pay

## 2022-06-24 MED ORDER — LEVOTHYROXINE SODIUM 25 MCG PO TABS
25.0000 ug | ORAL_TABLET | Freq: Every morning | ORAL | 4 refills | Status: DC
  Filled 2022-06-24 – 2022-08-10 (×3): qty 90, 90d supply, fill #0
  Filled 2022-11-03: qty 90, 90d supply, fill #1
  Filled 2023-01-29: qty 90, 90d supply, fill #2
  Filled 2023-05-03: qty 90, 90d supply, fill #3

## 2022-06-25 ENCOUNTER — Other Ambulatory Visit: Payer: Self-pay

## 2022-06-29 ENCOUNTER — Inpatient Hospital Stay: Payer: 59

## 2022-06-29 ENCOUNTER — Other Ambulatory Visit: Payer: Self-pay

## 2022-06-29 ENCOUNTER — Inpatient Hospital Stay: Payer: 59 | Attending: Hematology

## 2022-06-29 VITALS — BP 155/101 | HR 77 | Temp 98.8°F | Resp 18

## 2022-06-29 DIAGNOSIS — C8318 Mantle cell lymphoma, lymph nodes of multiple sites: Secondary | ICD-10-CM

## 2022-06-29 DIAGNOSIS — D709 Neutropenia, unspecified: Secondary | ICD-10-CM | POA: Insufficient documentation

## 2022-06-29 DIAGNOSIS — Z23 Encounter for immunization: Secondary | ICD-10-CM | POA: Diagnosis not present

## 2022-06-29 DIAGNOSIS — D702 Other drug-induced agranulocytosis: Secondary | ICD-10-CM

## 2022-06-29 LAB — CBC WITH DIFFERENTIAL (CANCER CENTER ONLY)
Abs Immature Granulocytes: 0 10*3/uL (ref 0.00–0.07)
Basophils Absolute: 0 10*3/uL (ref 0.0–0.1)
Basophils Relative: 0 %
Eosinophils Absolute: 0 10*3/uL (ref 0.0–0.5)
Eosinophils Relative: 2 %
HCT: 44 % (ref 39.0–52.0)
Hemoglobin: 15.9 g/dL (ref 13.0–17.0)
Lymphocytes Relative: 57 %
Lymphs Abs: 1.3 10*3/uL (ref 0.7–4.0)
MCH: 32 pg (ref 26.0–34.0)
MCHC: 36.1 g/dL — ABNORMAL HIGH (ref 30.0–36.0)
MCV: 88.5 fL (ref 80.0–100.0)
Monocytes Absolute: 0.2 10*3/uL (ref 0.1–1.0)
Monocytes Relative: 10 %
Neutro Abs: 0.7 10*3/uL — ABNORMAL LOW (ref 1.7–7.7)
Neutrophils Relative %: 31 %
Platelet Count: 183 10*3/uL (ref 150–400)
RBC: 4.97 MIL/uL (ref 4.22–5.81)
RDW: 12.2 % (ref 11.5–15.5)
Smear Review: NORMAL
WBC Count: 2.3 10*3/uL — ABNORMAL LOW (ref 4.0–10.5)
nRBC: 0 % (ref 0.0–0.2)

## 2022-06-29 LAB — CMP (CANCER CENTER ONLY)
ALT: 33 U/L (ref 0–44)
AST: 24 U/L (ref 15–41)
Albumin: 4.4 g/dL (ref 3.5–5.0)
Alkaline Phosphatase: 162 U/L — ABNORMAL HIGH (ref 38–126)
Anion gap: 6 (ref 5–15)
BUN: 18 mg/dL (ref 8–23)
CO2: 28 mmol/L (ref 22–32)
Calcium: 9.1 mg/dL (ref 8.9–10.3)
Chloride: 105 mmol/L (ref 98–111)
Creatinine: 1.11 mg/dL (ref 0.61–1.24)
GFR, Estimated: >60 mL/min (ref >60)
Glucose, Bld: 101 mg/dL — ABNORMAL HIGH (ref 70–99)
Potassium: 3.8 mmol/L (ref 3.5–5.1)
Sodium: 139 mmol/L (ref 135–145)
Total Bilirubin: 0.5 mg/dL (ref 0.3–1.2)
Total Protein: 6.8 g/dL (ref 6.5–8.1)

## 2022-06-29 LAB — LACTATE DEHYDROGENASE: LDH: 331 U/L — ABNORMAL HIGH (ref 98–192)

## 2022-06-29 MED ORDER — PEGFILGRASTIM INJECTION 6 MG/0.6ML ~~LOC~~
6.0000 mg | PREFILLED_SYRINGE | Freq: Once | SUBCUTANEOUS | Status: AC
  Administered 2022-06-29: 6 mg via SUBCUTANEOUS
  Filled 2022-06-29: qty 0.6

## 2022-06-29 NOTE — Patient Instructions (Signed)

## 2022-07-08 ENCOUNTER — Telehealth: Payer: Self-pay | Admitting: Gastroenterology

## 2022-07-08 ENCOUNTER — Encounter: Payer: Self-pay | Admitting: Hematology

## 2022-07-08 NOTE — Telephone Encounter (Signed)
Dr. Lyndel Safe,  Are you okay with taking over patients care?

## 2022-07-08 NOTE — Telephone Encounter (Signed)
Yes it is fine, I have not seen him since 2021.

## 2022-07-08 NOTE — Telephone Encounter (Signed)
Good Morning Dr. Silverio Decamp,   Patient called stating that he wanted to transfer his care over to Dr. Lyndel Safe. Are you okay with Dr. Lyndel Safe taking on patients care? Please advise.   Please advise.

## 2022-07-11 NOTE — Telephone Encounter (Signed)
No problems. RG 

## 2022-07-13 ENCOUNTER — Inpatient Hospital Stay: Payer: 59

## 2022-07-13 ENCOUNTER — Inpatient Hospital Stay (HOSPITAL_BASED_OUTPATIENT_CLINIC_OR_DEPARTMENT_OTHER): Payer: 59 | Admitting: Hematology

## 2022-07-13 ENCOUNTER — Encounter: Payer: Self-pay | Admitting: Gastroenterology

## 2022-07-13 ENCOUNTER — Other Ambulatory Visit: Payer: Self-pay

## 2022-07-13 VITALS — BP 174/105 | HR 69 | Temp 97.9°F | Resp 17 | Ht 69.0 in | Wt 192.5 lb

## 2022-07-13 DIAGNOSIS — D709 Neutropenia, unspecified: Secondary | ICD-10-CM | POA: Diagnosis not present

## 2022-07-13 DIAGNOSIS — Z23 Encounter for immunization: Secondary | ICD-10-CM

## 2022-07-13 DIAGNOSIS — C8318 Mantle cell lymphoma, lymph nodes of multiple sites: Secondary | ICD-10-CM

## 2022-07-13 DIAGNOSIS — Z8042 Family history of malignant neoplasm of prostate: Secondary | ICD-10-CM

## 2022-07-13 LAB — CMP (CANCER CENTER ONLY)
ALT: 28 U/L (ref 0–44)
AST: 26 U/L (ref 15–41)
Albumin: 4.5 g/dL (ref 3.5–5.0)
Alkaline Phosphatase: 1455 U/L — ABNORMAL HIGH (ref 38–126)
Anion gap: 6 (ref 5–15)
BUN: 15 mg/dL (ref 8–23)
CO2: 28 mmol/L (ref 22–32)
Calcium: 9.4 mg/dL (ref 8.9–10.3)
Chloride: 106 mmol/L (ref 98–111)
Creatinine: 1.08 mg/dL (ref 0.61–1.24)
GFR, Estimated: >60 mL/min (ref >60)
Glucose, Bld: 87 mg/dL (ref 70–99)
Potassium: 3.7 mmol/L (ref 3.5–5.1)
Sodium: 140 mmol/L (ref 135–145)
Total Bilirubin: 0.5 mg/dL (ref 0.3–1.2)
Total Protein: 6.9 g/dL (ref 6.5–8.1)

## 2022-07-13 LAB — CBC WITH DIFFERENTIAL (CANCER CENTER ONLY)
Abs Immature Granulocytes: 0.13 10*3/uL — ABNORMAL HIGH (ref 0.00–0.07)
Basophils Absolute: 0.1 10*3/uL (ref 0.0–0.1)
Basophils Relative: 1 %
Eosinophils Absolute: 0 10*3/uL (ref 0.0–0.5)
Eosinophils Relative: 1 %
HCT: 41.9 % (ref 39.0–52.0)
Hemoglobin: 14.9 g/dL (ref 13.0–17.0)
Immature Granulocytes: 2 %
Lymphocytes Relative: 16 %
Lymphs Abs: 1.3 10*3/uL (ref 0.7–4.0)
MCH: 31.4 pg (ref 26.0–34.0)
MCHC: 35.6 g/dL (ref 30.0–36.0)
MCV: 88.4 fL (ref 80.0–100.0)
Monocytes Absolute: 0.5 10*3/uL (ref 0.1–1.0)
Monocytes Relative: 7 %
Neutro Abs: 6 10*3/uL (ref 1.7–7.7)
Neutrophils Relative %: 73 %
Platelet Count: 148 10*3/uL — ABNORMAL LOW (ref 150–400)
RBC: 4.74 MIL/uL (ref 4.22–5.81)
RDW: 12.9 % (ref 11.5–15.5)
WBC Count: 8.2 10*3/uL (ref 4.0–10.5)
nRBC: 0 % (ref 0.0–0.2)

## 2022-07-13 LAB — LACTATE DEHYDROGENASE: LDH: 458 U/L — ABNORMAL HIGH (ref 98–192)

## 2022-07-13 MED ORDER — INFLUENZA VAC A&B SA ADJ QUAD 0.5 ML IM PRSY: 0.5000 mL | PREFILLED_SYRINGE | Freq: Once | INTRAMUSCULAR | Status: DC

## 2022-07-13 MED ORDER — INFLUENZA VAC A&B SA ADJ QUAD 0.5 ML IM PRSY
0.5000 mL | PREFILLED_SYRINGE | Freq: Once | INTRAMUSCULAR | Status: AC
  Administered 2022-07-13: 0.5 mL via INTRAMUSCULAR
  Filled 2022-07-13: qty 0.5

## 2022-07-13 NOTE — Progress Notes (Signed)
HEMATOLOGY/ONCOLOGY CLINIC VISIT NOTE  Date of Service: 07/13/2022  Patient Care Team: [name], MD as PCP - General (Family Medicine)  CHIEF COMPLAINTS/PURPOSE OF CONSULTATION:  Follow-up for continued evaluation and management of mantle cell lymphoma  HISTORY OF PRESENTING ILLNESS:  Please see previous note for details on initial presentation.  INTERVAL HISTORY:  Brian Kaiser is here today for continued evaluation and management of mantle cell lymphoma.  He was last seen by me on 04/24/2022 and was doing well.  He is here with his wife during today's visit and reports he has been doing well without any new symptoms. He denies new injuries, new infection, abdominal pain, back pain, fever, and chills.  He complains of mild discomfort near his groin, which is worse when he seats. He denies of any injuries near his groin and urine problems.   He had questions about future blood works and about his mantle cell lymphoma, which were answered.   His wife reports he has been having throat problems, such as sore throat, but it is not severe.   He asked if he could get influenza vaccine and COVID-19 booster now since WBC has improved. Both were recommended to the patient.     MEDICAL HISTORY:  Past Medical History:  Diagnosis Date   Allergy    Forearm laceration    right forearm   GERD (gastroesophageal reflux disease)    Hepatitis B    Hyperlipidemia    Pulmonary lesion    Left pulmonary schwannoma   Schwannoma    left lower lung    SURGICAL HISTORY: Past Surgical History:  Procedure Laterality Date   COLONOSCOPY     LIGAMENT REPAIR Right 04/19/2019   Right forearm   LIPOMA EXCISION Left    neck   LUNG SURGERY Left    OTHER SURGICAL HISTORY Left    pulmonary schwannoma excision   UPPER GASTROINTESTINAL ENDOSCOPY     WOUND EXPLORATION Right 04/19/2019   Procedure: WOUND EXPLORATION; REPAIR RIGHT FOREARM LACERATION;  Surgeon: Charlotte Crumb, MD;   Location: [place];  Service: Orthopedics;  Laterality: Right;  AXILLARY BLOCK AND MAC    SOCIAL HISTORY: Social History   Socioeconomic History   Marital status: Married    Spouse name: Not on file   Number of children: Not on file   Years of education: Not on file   Highest education level: Not on file  Occupational History   Not on file  Tobacco Use   Smoking status: Never   Smokeless tobacco: Never  Vaping Use   Vaping Use: Never used  Substance and Sexual Activity   Alcohol use: No    Alcohol/week: 0.0 standard drinks of alcohol   Drug use: No   Sexual activity: Not on file  Other Topics Concern   Not on file  Social History Narrative   Not on file   Social Determinants of Health   Financial Resource Strain: Not on file  Food Insecurity: Not on file  Transportation Needs: Not on file  Physical Activity: Not on file  Stress: Not on file  Social Connections: Not on file  Intimate Partner Violence: Not on file    FAMILY HISTORY: Family History  Problem Relation Age of Onset   Macular degeneration Mother    Dementia Mother    Diverticulitis Mother    Prostate cancer Paternal Grandfather    Diverticulitis Brother    Colon cancer Neg Hx    Colon polyps Neg Hx  Esophageal cancer Neg Hx    Liver cancer Neg Hx    Pancreatic cancer Neg Hx    Rectal cancer Neg Hx    Stomach cancer Neg Hx     ALLERGIES:  is allergic to augmentin [amoxicillin-pot clavulanate].  MEDICATIONS:  Current Outpatient Medications  Medication Sig Dispense Refill   acetaminophen (TYLENOL) 325 MG tablet Take 325 mg by mouth every 6 (six) hours as needed for moderate pain or headache.     allopurinol (ZYLOPRIM) 100 MG tablet TAKE 1 TABLET BY MOUTH TWO TIMES DAILY 60 tablet 0   cholecalciferol (VITAMIN D3) 25 MCG (1000 UNIT) tablet Take 1,000 Units by mouth daily.     famotidine (PEPCID) 20 MG tablet Take 1 tablet (20 mg total) by mouth at bedtime. 30 tablet 3    levothyroxine (SYNTHROID) 25 MCG tablet Take 1 tablet by mouth daily in the morning on an empty stomach 90 tablet 4   Multiple Vitamin (MULTIVITAMIN) tablet Take 1 tablet by mouth daily.     mupirocin ointment (BACTROBAN) 2 % Apply to affected area up to 3 times a day as needed 22 g PRN   Omega-3 Fatty Acids (FISH OIL) 1000 MG CAPS Take 1,000 mg by mouth daily.     pantoprazole (PROTONIX) 20 MG tablet Take 2 tablets (40 mg total) by mouth daily. 180 tablet 3   predniSONE (DELTASONE) 10 MG tablet Take 6 tablets by mouth once daily for 7 days--take 5 tablets once daily for 7 days--take 4 tablets once daily for 7 days--take 2 tablets once daily for 7 days. 119 tablet 0   vitamin C (ASCORBIC ACID) 250 MG tablet Take 250 mg by mouth daily.     Current Facility-Administered Medications  Medication Dose Route Frequency Provider Last Rate Last Admin   0.9 %  sodium chloride infusion   Intravenous PRN Eileen Stanford, PA-C        REVIEW OF SYSTEMS:   10 Point review of Systems was done is negative except as noted above.  PHYSICAL EXAMINATION: .BP (!) 174/105 (BP Location: Left Arm, Patient Position: Sitting) Comment: nurse notified  Pulse 69   Temp 97.9 F (36.6 C) (Temporal)   Resp 17   Ht _0  (1.753 m)   Wt 192 lb 8 oz (87.3 kg)   SpO2 100%   BMI 28.43 kg/m  . GENERAL:alert, in no acute distress and comfortable SKIN: no acute rashes, no significant lesions EYES: conjunctiva are pink and non-injected, sclera anicteric OROPHARYNX: MMM, no exudates, no oropharyngeal erythema or ulceration NECK: supple, no JVD LYMPH:  no palpable lymphadenopathy in the cervical, axillary or inguinal regions LUNGS: clear to auscultation b/l with normal respiratory effort HEART: regular rate & rhythm ABDOMEN:  normoactive bowel sounds , non tender, not distended. Extremity: no pedal edema PSYCH: alert & oriented x 3 with fluent speech NEURO: no focal motor/sensory deficits   LABORATORY DATA:    .    Latest Ref Rng & Units 07/13/2022   12:18 PM 06/29/2022    3:20 PM 06/19/2022   11:42 AM  CBC  WBC 4.0 - 10.5 K/uL 8.2  2.3  1.9   Hemoglobin 13.0 - 17.0 g/dL 14.9  15.9  15.7   Hematocrit 39.0 - 52.0 % 41.9  44.0  44.2   Platelets 150 - 400 K/uL 148  183  209    ANC 300 .    Latest Ref Rng & Units 07/13/2022   12:18 PM 06/29/2022    3:20 PM  06/19/2022   11:42 AM  CMP  Glucose 70 - 99 mg/dL 87  101  106   BUN 8 - 23 mg/dL _0 Creatinine 0.61 - 1.24 mg/dL 1.08  1.11  1.19   Sodium 135 - 145 mmol/L 140  139  140   Potassium 3.5 - 5.1 mmol/L 3.7  3.8  3.9   Chloride 98 - 111 mmol/L 106  105  104   CO2 22 - 32 mmol/L 28  28  32   Calcium 8.9 - 10.3 mg/dL 9.4  9.1  9.6   Total Protein 6.5 - 8.1 g/dL 6.9  6.8  6.9   Total Bilirubin 0.3 - 1.2 mg/dL 0.5  0.5  0.6   Alkaline Phos 38 - 126 U/L 1,455  162  158   AST 15 - 41 U/L _1 ALT 0 - 44 U/L 28  33  34    . Lab Results  Component Value Date   LDH 458 (H) 07/13/2022    06/20/2020 Right Axillary Surgical Pathology Report [id number]):    06/20/2020 Right Axillary Lymph Node Flow Pathology ([id number]):   06/11/2020 Flow Pathology ([id number]):   RADIOGRAPHIC STUDIES: I have personally reviewed the radiological images as listed and agreed with the findings in the report. No results found.  ASSESSMENT & PLAN:   66 yo with   1) stage IV mantle cell lymphoma with extensive lymphadenopathy and massive splenomegaly . 2) s/p massive splenomegaly likely related to mantle cell lymphoma  3) Thrombocytopenia-mild platelets of 210k likely related to lymphoma. - resolved 4) hepatitis B core antibody positive, surface antigen negative, HBV DNA PCR negative 5) Neutropenia - intermitent fluctuating-- likely from  immunologic changes from Rituxan use. No fevers.  PLAN: -Dicussed his lab results. His LDH levels are slightly high at 458 and blood count are stable. His WBC is at 8.2k which has been  improving with resolution of neutropenia with Udenyca. -Obtain lab work in a month -Discontinue his treatment.  -Recommend influenza vaccine, RSV and COVID-19 booster.  -he will confirm hjs pneumococcal vaccination status with PCP   FOLLOW UP: Phone visit with Dr Irene Limbo in4 weeks with labs 1 day prior to phone visit  The total time spent in the appointment was 23 minutes* .  All of the patient's questions were answered with apparent satisfaction. The patient knows to call the clinic with any problems, questions or concerns.   Zettie Cooley, am acting as a scribe for Sullivan Lone, MD.  Sullivan Lone MD [place] AAHIVMS [facility] Hematology/Oncology Physician [facility]  .*Total Encounter Time as defined by the Centers for Medicare and Medicaid Services includes, in addition to the face-to-face time of a patient visit (documented in the note above) non-face-to-face time: obtaining and reviewing outside history, ordering and reviewing medications, tests or procedures, care coordination (communications with other health care professionals or caregivers) and documentation in the medical record.

## 2022-07-14 ENCOUNTER — Other Ambulatory Visit: Payer: Self-pay

## 2022-07-14 NOTE — Telephone Encounter (Signed)
Patient has been scheduled for OV on 12/13 at 9:10

## 2022-07-15 DIAGNOSIS — D3132 Benign neoplasm of left choroid: Secondary | ICD-10-CM | POA: Diagnosis not present

## 2022-07-20 ENCOUNTER — Encounter: Payer: Self-pay | Admitting: Hematology

## 2022-08-07 ENCOUNTER — Other Ambulatory Visit (HOSPITAL_COMMUNITY): Payer: Self-pay

## 2022-08-10 ENCOUNTER — Other Ambulatory Visit (HOSPITAL_COMMUNITY): Payer: Self-pay

## 2022-08-17 ENCOUNTER — Inpatient Hospital Stay: Payer: 59 | Attending: Hematology

## 2022-08-17 ENCOUNTER — Other Ambulatory Visit: Payer: 59

## 2022-08-17 DIAGNOSIS — Z8572 Personal history of non-Hodgkin lymphomas: Secondary | ICD-10-CM | POA: Diagnosis not present

## 2022-08-17 DIAGNOSIS — D709 Neutropenia, unspecified: Secondary | ICD-10-CM | POA: Insufficient documentation

## 2022-08-17 DIAGNOSIS — C8318 Mantle cell lymphoma, lymph nodes of multiple sites: Secondary | ICD-10-CM

## 2022-08-17 LAB — CMP (CANCER CENTER ONLY)
ALT: 30 U/L (ref 0–44)
AST: 33 U/L (ref 15–41)
Albumin: 3.9 g/dL (ref 3.5–5.0)
Alkaline Phosphatase: 108 U/L (ref 38–126)
Anion gap: 7 (ref 5–15)
BUN: 17 mg/dL (ref 8–23)
CO2: 26 mmol/L (ref 22–32)
Calcium: 9.1 mg/dL (ref 8.9–10.3)
Chloride: 107 mmol/L (ref 98–111)
Creatinine: 0.95 mg/dL (ref 0.61–1.24)
GFR, Estimated: >60 mL/min (ref >60)
Glucose, Bld: 92 mg/dL (ref 70–99)
Potassium: 3.8 mmol/L (ref 3.5–5.1)
Sodium: 140 mmol/L (ref 135–145)
Total Bilirubin: 0.7 mg/dL (ref 0.3–1.2)
Total Protein: 6.7 g/dL (ref 6.5–8.1)

## 2022-08-17 LAB — CBC WITH DIFFERENTIAL (CANCER CENTER ONLY)
Abs Immature Granulocytes: 0.05 10*3/uL (ref 0.00–0.07)
Basophils Absolute: 0 10*3/uL (ref 0.0–0.1)
Basophils Relative: 2 %
Eosinophils Absolute: 0.1 10*3/uL (ref 0.0–0.5)
Eosinophils Relative: 4 %
HCT: 39.6 % (ref 39.0–52.0)
Hemoglobin: 14 g/dL (ref 13.0–17.0)
Immature Granulocytes: 2 %
Lymphocytes Relative: 48 %
Lymphs Abs: 1 10*3/uL (ref 0.7–4.0)
MCH: 31.9 pg (ref 26.0–34.0)
MCHC: 35.4 g/dL (ref 30.0–36.0)
MCV: 90.2 fL (ref 80.0–100.0)
Monocytes Absolute: 0.4 10*3/uL (ref 0.1–1.0)
Monocytes Relative: 20 %
Neutro Abs: 0.5 10*3/uL — ABNORMAL LOW (ref 1.7–7.7)
Neutrophils Relative %: 24 %
Platelet Count: 178 10*3/uL (ref 150–400)
RBC: 4.39 MIL/uL (ref 4.22–5.81)
RDW: 12.6 % (ref 11.5–15.5)
WBC Count: 2.1 10*3/uL — ABNORMAL LOW (ref 4.0–10.5)
nRBC: 0 % (ref 0.0–0.2)

## 2022-08-17 LAB — LACTATE DEHYDROGENASE: LDH: 272 U/L — ABNORMAL HIGH (ref 98–192)

## 2022-08-18 ENCOUNTER — Other Ambulatory Visit: Payer: 59

## 2022-08-18 ENCOUNTER — Inpatient Hospital Stay (HOSPITAL_BASED_OUTPATIENT_CLINIC_OR_DEPARTMENT_OTHER): Payer: 59 | Admitting: Hematology

## 2022-08-18 DIAGNOSIS — Z8572 Personal history of non-Hodgkin lymphomas: Secondary | ICD-10-CM | POA: Diagnosis not present

## 2022-08-18 DIAGNOSIS — D702 Other drug-induced agranulocytosis: Secondary | ICD-10-CM | POA: Diagnosis not present

## 2022-08-18 DIAGNOSIS — C8318 Mantle cell lymphoma, lymph nodes of multiple sites: Secondary | ICD-10-CM

## 2022-08-18 DIAGNOSIS — D709 Neutropenia, unspecified: Secondary | ICD-10-CM | POA: Diagnosis not present

## 2022-08-18 NOTE — Progress Notes (Signed)
HEMATOLOGY/ONCOLOGY CLINIC VISIT NOTE  Date of Service: 08/18/2022  Patient Care Team: [name], MD as PCP - General (Family Medicine) Brunetta Genera, MD as Consulting Physician (Hematology)  CHIEF COMPLAINTS/PURPOSE OF CONSULTATION:  Follow-up for continued evaluation and management of mantle cell lymphoma  HISTORY OF PRESENTING ILLNESS:  Please see previous note for details on initial presentation.  INTERVAL HISTORY:  Brian Kaiser is here today for continued evaluation and management of mantle cell lymphoma. .I connected with Corliss Parish on 08/18/2022 at  3:30 PM EST by telephone visit and verified that I am speaking with the correct person using two identifiers.   Patient was last seen by me on 07/13/2022 and was doing well overall.   Patient reports he has been doing well without any new medical concerns since our last visit. He does complain of hip pain which causes him sleeping problem. The left hip pain gets better once he walks. He notes that his hip pain was better when he was on steroids.   He denies fever, chills, back pain, abnormal bowl moment, or leg swelling. He complains of "itchy" skin at night, which gets better in the morning.   Patient complains of bilateral ankle swelling after discontinuing Prednisone, but notes that the swelling decreases in the morning.   He has received his influenza vaccine and COVID-19 Booster.   I discussed the limitations, risks, security and privacy concerns of performing an evaluation and management service by telemedicine and the availability of in-person appointments. I also discussed with the patient that there may be a patient responsible charge related to this service. The patient expressed understanding and agreed to proceed.   Other persons participating in the visit and their role in the encounter: None   Patient's location: Home  Provider's location: [facility]   Chief Complaint: mantle cell lymphoma  MEDICAL  HISTORY:  Past Medical History:  Diagnosis Date   Allergy    Forearm laceration    right forearm   GERD (gastroesophageal reflux disease)    Hepatitis B    Hyperlipidemia    Pulmonary lesion    Left pulmonary schwannoma   Schwannoma    left lower lung    SURGICAL HISTORY: Past Surgical History:  Procedure Laterality Date   COLONOSCOPY     LIGAMENT REPAIR Right 04/19/2019   Right forearm   LIPOMA EXCISION Left    neck   LUNG SURGERY Left    OTHER SURGICAL HISTORY Left    pulmonary schwannoma excision   UPPER GASTROINTESTINAL ENDOSCOPY     WOUND EXPLORATION Right 04/19/2019   Procedure: WOUND EXPLORATION; REPAIR RIGHT FOREARM LACERATION;  Surgeon: Charlotte Crumb, MD;  Location: [facility];  Service: Orthopedics;  Laterality: Right;  AXILLARY BLOCK AND MAC    SOCIAL HISTORY: Social History   Socioeconomic History   Marital status: Married    Spouse name: Not on file   Number of children: Not on file   Years of education: Not on file   Highest education level: Not on file  Occupational History   Not on file  Tobacco Use   Smoking status: Never   Smokeless tobacco: Never  Vaping Use   Vaping Use: Never used  Substance and Sexual Activity   Alcohol use: No    Alcohol/week: 0.0 standard drinks of alcohol   Drug use: No   Sexual activity: Not on file  Other Topics Concern   Not on file  Social History Narrative   Not on file  Social Determinants of Health   Financial Resource Strain: Not on file  Food Insecurity: Not on file  Transportation Needs: Not on file  Physical Activity: Not on file  Stress: Not on file  Social Connections: Not on file  Intimate Partner Violence: Not on file    FAMILY HISTORY: Family History  Problem Relation Age of Onset   Macular degeneration Mother    Dementia Mother    Diverticulitis Mother    Prostate cancer Paternal Grandfather    Diverticulitis Brother    Colon cancer Neg Hx    Colon polyps Neg Hx     Esophageal cancer Neg Hx    Liver cancer Neg Hx    Pancreatic cancer Neg Hx    Rectal cancer Neg Hx    Stomach cancer Neg Hx     ALLERGIES:  is allergic to augmentin [amoxicillin-pot clavulanate].  MEDICATIONS:  Current Outpatient Medications  Medication Sig Dispense Refill   acetaminophen (TYLENOL) 325 MG tablet Take 325 mg by mouth every 6 (six) hours as needed for moderate pain or headache.     allopurinol (ZYLOPRIM) 100 MG tablet TAKE 1 TABLET BY MOUTH TWO TIMES DAILY 60 tablet 0   cholecalciferol (VITAMIN D3) 25 MCG (1000 UNIT) tablet Take 1,000 Units by mouth daily.     famotidine (PEPCID) 20 MG tablet Take 1 tablet (20 mg total) by mouth at bedtime. 30 tablet 3   levothyroxine (SYNTHROID) 25 MCG tablet Take 1 tablet (25 mcg total) by mouth in the morning on an empty stomach 90 tablet 4   Multiple Vitamin (MULTIVITAMIN) tablet Take 1 tablet by mouth daily.     mupirocin ointment (BACTROBAN) 2 % Apply to affected area up to 3 times a day as needed 22 g PRN   Omega-3 Fatty Acids (FISH OIL) 1000 MG CAPS Take 1,000 mg by mouth daily.     pantoprazole (PROTONIX) 20 MG tablet Take 2 tablets (40 mg total) by mouth daily. 180 tablet 3   predniSONE (DELTASONE) 10 MG tablet Take 6 tablets by mouth once daily for 7 days--take 5 tablets once daily for 7 days--take 4 tablets once daily for 7 days--take 2 tablets once daily for 7 days. 119 tablet 0   vitamin C (ASCORBIC ACID) 250 MG tablet Take 250 mg by mouth daily.     Current Facility-Administered Medications  Medication Dose Route Frequency Provider Last Rate Last Admin   0.9 %  sodium chloride infusion   Intravenous PRN Eileen Stanford, PA-C        REVIEW OF SYSTEMS:   10 Point review of Systems was done is negative except as noted above.  PHYSICAL EXAMINATION: .telemedicine visit   LABORATORY DATA:   .    Latest Ref Rng & Units 08/17/2022    4:29 PM 07/13/2022   12:18 PM 06/29/2022    3:20 PM  CBC  WBC 4.0 - 10.5 K/uL  2.1  8.2  2.3   Hemoglobin 13.0 - 17.0 g/dL 14.0  14.9  15.9   Hematocrit 39.0 - 52.0 % 39.6  41.9  44.0   Platelets 150 - 400 K/uL 178  148  183    ANC 500 .    Latest Ref Rng & Units 08/17/2022    4:29 PM 07/13/2022   12:18 PM 06/29/2022    3:20 PM  CMP  Glucose 70 - 99 mg/dL 92  87  101   BUN 8 - 23 mg/dL _0 Creatinine  0.61 - 1.24 mg/dL 0.95  1.08  1.11   Sodium 135 - 145 mmol/L 140  140  139   Potassium 3.5 - 5.1 mmol/L 3.8  3.7  3.8   Chloride 98 - 111 mmol/L 107  106  105   CO2 22 - 32 mmol/L _0 Calcium 8.9 - 10.3 mg/dL 9.1  9.4  9.1   Total Protein 6.5 - 8.1 g/dL 6.7  6.9  6.8   Total Bilirubin 0.3 - 1.2 mg/dL 0.7  0.5  0.5   Alkaline Phos 38 - 126 U/L 108  1,455  162   AST 15 - 41 U/L 33  26  24   ALT 0 - 44 U/L 30  28  33    . Lab Results  Component Value Date   LDH 272 (H) 08/17/2022    06/20/2020 Right Axillary Surgical Pathology Report [id number]):    06/20/2020 Right Axillary Lymph Node Flow Pathology ([id number]):   06/11/2020 Flow Pathology ([id number]):   RADIOGRAPHIC STUDIES: I have personally reviewed the radiological images as listed and agreed with the findings in the report. No results found.  ASSESSMENT & PLAN:   66 yo with   1) stage IV mantle cell lymphoma with extensive lymphadenopathy and massive splenomegaly . 2) s/p massive splenomegaly likely related to mantle cell lymphoma  3) Thrombocytopenia-mild platelets of 210k likely related to lymphoma. - resolved 4) hepatitis B core antibody positive, surface antigen negative, HBV DNA PCR negative 5) Neutropenia - intermitent fluctuating-- likely from  immunologic changes from Rituxan use. No fevers.  PLAN: -Dicussed his lab results from 08/17/2022.  His LDH levels are slightly high at 272 and blood count are stable. His WBC is at 2.1 k, which is lower than previous lab.  -no further maintenance Rituxan at this time. -no indication for rpt G-CSF unless  significant infections -Will get lab in 3 months. -recommend to maintain a healthy diet.   FOLLOW UP: RTC with Dr Irene Limbo with labs in 3 months  The total time spent in the appointment was 15 minutes* .  All of the patient's questions were answered with apparent satisfaction. The patient knows to call the clinic with any problems, questions or concerns.   Sullivan Lone MD MS AAHIVMS [facility] Hematology/Oncology Physician [facility]  .*Total Encounter Time as defined by the Centers for Medicare and Medicaid Services includes, in addition to the face-to-face time of a patient visit (documented in the note above) non-face-to-face time: obtaining and reviewing outside history, ordering and reviewing medications, tests or procedures, care coordination (communications with other health care professionals or caregivers) and documentation in the medical record.   I, Cleda Mccreedy, am acting as a Education administrator for Sullivan Lone, MD. .I have reviewed the above documentation for accuracy and completeness, and I agree with the above. Brunetta Genera MD

## 2022-08-20 ENCOUNTER — Other Ambulatory Visit: Payer: Self-pay

## 2022-08-24 ENCOUNTER — Other Ambulatory Visit: Payer: Self-pay

## 2022-08-24 ENCOUNTER — Encounter: Payer: Self-pay | Admitting: Hematology

## 2022-09-01 DIAGNOSIS — Z08 Encounter for follow-up examination after completed treatment for malignant neoplasm: Secondary | ICD-10-CM | POA: Diagnosis not present

## 2022-09-01 DIAGNOSIS — D225 Melanocytic nevi of trunk: Secondary | ICD-10-CM | POA: Diagnosis not present

## 2022-09-01 DIAGNOSIS — Z85820 Personal history of malignant melanoma of skin: Secondary | ICD-10-CM | POA: Diagnosis not present

## 2022-09-01 DIAGNOSIS — Z85828 Personal history of other malignant neoplasm of skin: Secondary | ICD-10-CM | POA: Diagnosis not present

## 2022-09-01 DIAGNOSIS — L821 Other seborrheic keratosis: Secondary | ICD-10-CM | POA: Diagnosis not present

## 2022-09-01 DIAGNOSIS — L814 Other melanin hyperpigmentation: Secondary | ICD-10-CM | POA: Diagnosis not present

## 2022-09-01 DIAGNOSIS — L989 Disorder of the skin and subcutaneous tissue, unspecified: Secondary | ICD-10-CM | POA: Diagnosis not present

## 2022-09-01 DIAGNOSIS — D492 Neoplasm of unspecified behavior of bone, soft tissue, and skin: Secondary | ICD-10-CM | POA: Diagnosis not present

## 2022-09-01 DIAGNOSIS — L570 Actinic keratosis: Secondary | ICD-10-CM | POA: Diagnosis not present

## 2022-09-02 ENCOUNTER — Encounter: Payer: Self-pay | Admitting: Gastroenterology

## 2022-09-02 ENCOUNTER — Other Ambulatory Visit: Payer: Self-pay

## 2022-09-02 ENCOUNTER — Ambulatory Visit (INDEPENDENT_AMBULATORY_CARE_PROVIDER_SITE_OTHER): Payer: 59 | Admitting: Gastroenterology

## 2022-09-02 ENCOUNTER — Other Ambulatory Visit (HOSPITAL_COMMUNITY): Payer: Self-pay

## 2022-09-02 VITALS — BP 136/84 | HR 72 | Ht 69.0 in | Wt 195.0 lb

## 2022-09-02 DIAGNOSIS — K219 Gastro-esophageal reflux disease without esophagitis: Secondary | ICD-10-CM | POA: Diagnosis not present

## 2022-09-02 MED ORDER — FAMOTIDINE 20 MG PO TABS
20.0000 mg | ORAL_TABLET | Freq: Every day | ORAL | 4 refills | Status: AC
  Filled 2022-09-02: qty 90, 90d supply, fill #0
  Filled 2022-11-03: qty 90, 90d supply, fill #1

## 2022-09-02 MED ORDER — PANTOPRAZOLE SODIUM 40 MG PO TBEC
40.0000 mg | DELAYED_RELEASE_TABLET | Freq: Every day | ORAL | 4 refills | Status: AC
  Filled 2022-09-02: qty 90, 90d supply, fill #0
  Filled 2022-11-03: qty 90, 90d supply, fill #1
  Filled 2023-01-29: qty 90, 90d supply, fill #2
  Filled 2023-07-27: qty 90, 90d supply, fill #3

## 2022-09-02 NOTE — Patient Instructions (Signed)
_______________________________________________________  If you are age 65 or older, your body mass index should be between 23-30. Your Body mass index is 28.8 kg/m. If this is out of the aforementioned range listed, please consider follow up with your Primary Care Provider.  If you are age 56 or younger, your body mass index should be between 19-25. Your Body mass index is 28.8 kg/m. If this is out of the aformentioned range listed, please consider follow up with your Primary Care Provider.   ________________________________________________________  The [facility] providers would like to encourage you to use [facility] to communicate with providers for non-urgent requests or questions.  Due to long hold times on the telephone, sending your provider a message by [facility] may be a faster and more efficient way to get a response.  Please allow 48 business hours for a response.  Please remember that this is for non-urgent requests.  _______________________________________________________  We have sent the following medications to your pharmacy for you to pick up at your convenience: Protonix take 30 minutes before meal Pepcid daily at night  Please call Dr. Leland Her nurse  in 2 weeks at [phone] with an update.    Please follow up in 6 months. Give [place] a call at [phone] to schedule an appointment.   Thank you,  Dr. Jackquline Denmark

## 2022-09-02 NOTE — Progress Notes (Signed)
Chief Complaint: FU  Referring Provider:  [name], MD      ASSESSMENT AND PLAN;   #1. GERD with postnasal drip/nocturnal throat clearing/occasional hoarseness.  No dysphagia or thrush.  Plan: -Continue protonix 69m po QD (change timing to 1/2hr before supper) -Pepcid 277mpo QHS -Raise HOB 6 inch blocks -Call in 2 weeks. If still with problems, BID protonix vs trial of carafate elixir followed by ENT consultation.  -Rpt EGD only if continued problems -FU in 6 months.   HPI:    Brian Kaiser a 89.o. male  With Hepatitis B, LLL Schwanoma s/p lobectomy, hypothyroidism, stage IV mantle cell lymphoma in remission (on rituximab, on hold d/t neutropenia, neg PET 12/2020), [facility] ever since he has been taking Protonix 40 mg p.o. daily on regular basis (at 3 PM).  Previously when he stopped, he started having chest pains and waterbrashing.  Has "throat clearing" with phlegm mostly at night which bother him the most currently.  He does sometimes get hoarse and loses his voice.    Has not seen ENT or allergy specialist.  No odynophagia or dysphagia.  No melena or hematochezia.  He denies having any diarrhea or constipation.   Past GI workup:  EGD 08/04/2019 - LA Grade C reflux esophagitis with no bleeding. - Benign-appearing esophageal stenosis. - Small hiatal hernia. - Erythematous mucosa in the prepyloric region of the stomach. Biopsied. - Erythematous duodenopathy.  EGD 09/26/2014 -NL -Dil 52Fr  Colonoscopy 12/2017 - One 1 mm polyp at the ileocecal valve, removed with a cold biopsy forceps. Resected and retrieved. - One 5 mm polyp in the sigmoid colon, removed with a cold snare. Resected and retrieved. - Mild diverticulosis in the sigmoid colon and in the ascending colon. - Bx: neg. Repeat colonoscopy in 10 years  CT CAP 05/2020 -Extensive adenopathy in the chest, abdomen and pelvis. Marked splenomegaly    PET 01/13/2021 1. No evidence of recurrent lymphoma. 2.  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 [employer]2 daughters  Past Medical History:  Diagnosis Date   Allergy    Forearm laceration    right forearm   GERD (gastroesophageal reflux disease)    Hepatitis B    Hyperlipidemia    Pulmonary lesion    Left pulmonary schwannoma   Schwannoma    left lower lung    Past Surgical History:  Procedure Laterality Date   COLONOSCOPY     LIGAMENT REPAIR Right 04/19/2019   Right forearm   LIPOMA EXCISION Left    neck   LUNG SURGERY Left    OTHER SURGICAL HISTORY Left    pulmonary schwannoma excision   UPPER GASTROINTESTINAL ENDOSCOPY     WOUND EXPLORATION Right 04/19/2019   Procedure: WOUND EXPLORATION; REPAIR RIGHT FOREARM LACERATION;  Surgeon: WeCharlotte CrumbMD;  [place] [facility] Service: [place]ics;  Laterality: Right;  AXILLARY BLOCK AND MAC    Family History  Problem Relation Age of Onset   Macular degeneration Mother    Dementia Mother    Diverticulitis Mother    Prostate cancer Paternal Grandfather    Diverticulitis Brother    Colon cancer Neg Hx    Colon polyps Neg Hx    Esophageal cancer Neg Hx    Liver cancer Neg Hx    Pancreatic cancer Neg Hx    Rectal cancer Neg Hx    Stomach cancer  Neg Hx     Social History   Tobacco Use   Smoking status: Never   Smokeless tobacco: Never  Vaping Use   Vaping Use: Never used  Substance Use Topics   Alcohol use: No    Alcohol/week: 0.0 standard drinks of alcohol   Drug use: No    Current Outpatient Medications  Medication Sig Dispense Refill   acetaminophen (TYLENOL) 325 MG tablet Take 325 mg by mouth every 6 (six) hours as needed for moderate pain or headache.     Ascorbic Acid (VITAMIN C) 1000 MG tablet Take 1,000 mg by mouth daily.     cholecalciferol (VITAMIN D3) 25 MCG (1000 UNIT) tablet Take 1,000 Units by mouth daily.     levothyroxine  (SYNTHROID) 25 MCG tablet Take 1 tablet (25 mcg total) by mouth in the morning on an empty stomach 90 tablet 4   Magnesium 200 MG TABS Take 1 tablet by mouth daily.     Multiple Vitamin (MULTIVITAMIN) tablet Take 1 tablet by mouth daily.     Multiple Vitamins-Minerals (MENS 50+ MULTI VITAMIN/MIN PO) Take 1 tablet by mouth daily.     Omega-3 Fatty Acids (FISH OIL) 1000 MG CAPS Take 1,000 mg by mouth daily.     pantoprazole (PROTONIX) 20 MG tablet Take 2 tablets (40 mg total) by mouth daily. 180 tablet 3   Current Facility-Administered Medications  Medication Dose Route Frequency Provider Last Rate Last Admin   0.9 %  sodium chloride infusion   Intravenous PRN Eileen Stanford, PA-C        Allergies  Allergen Reactions   Augmentin [Amoxicillin-Pot Clavulanate] Rash    Pt states he had a rash from augmentin 29 years ago.    Review of Systems:  No night sweats.     Physical Exam:    BP 136/84   Pulse 72   Ht _0  (1.753 m)   Wt 195 lb (88.5 kg)   SpO2 99%   BMI 28.80 kg/m  Wt Readings from Last 3 Encounters:  09/02/22 195 lb (88.5 kg)  07/13/22 192 lb 8 oz (87.3 kg)  04/24/22 192 lb 4.8 oz (87.2 kg)   Constitutional:  Well-developed, in no acute distress. Psychiatric: Normal mood and affect. Behavior is normal. HEENT: Conjunctivae are normal. No scleral icterus.  No thrush. Cardiovascular: Normal rate, regular rhythm. No edema Pulmonary/chest: Effort normal and breath sounds normal. No wheezing, rales or rhonchi. Abdominal: Soft, nondistended. Nontender. Bowel sounds active throughout. There are no masses palpable. No hepatomegaly. Rectal: Deferred Neurological: Alert and oriented to person place and time. Skin: Skin is warm and dry. No rashes noted.  Data Reviewed: I have personally reviewed following labs and imaging studies  CBC:    Latest Ref Rng & Units 08/17/2022    4:29 PM 07/13/2022   12:18 PM 06/29/2022    3:20 PM  CBC  WBC 4.0 - 10.5 K/uL 2.1  8.2  2.3    Hemoglobin 13.0 - 17.0 g/dL 14.0  14.9  15.9   Hematocrit 39.0 - 52.0 % 39.6  41.9  44.0   Platelets 150 - 400 K/uL 178  148  183     CMP:    Latest Ref Rng & Units 08/17/2022    4:29 PM 07/13/2022   12:18 PM 06/29/2022    3:20 PM  CMP  Glucose 70 - 99 mg/dL 92  87  101   BUN 8 - 23 mg/dL _1 Creatinine  0.61 - 1.24 mg/dL 0.95  1.08  1.11   Sodium 135 - 145 mmol/L 140  140  139   Potassium 3.5 - 5.1 mmol/L 3.8  3.7  3.8   Chloride 98 - 111 mmol/L 107  106  105   CO2 22 - 32 mmol/L _0 Calcium 8.9 - 10.3 mg/dL 9.1  9.4  9.1   Total Protein 6.5 - 8.1 g/dL 6.7  6.9  6.8   Total Bilirubin 0.3 - 1.2 mg/dL 0.7  0.5  0.5   Alkaline Phos 38 - 126 U/L 108  1,455  162   AST 15 - 41 U/L 33  26  24   ALT 0 - 44 U/L 30  28  33        Carmell Austria, MD 09/02/2022, 9:13 AM  Cc: [name], MD

## 2022-09-03 ENCOUNTER — Encounter: Payer: Self-pay | Admitting: Hematology

## 2022-09-05 ENCOUNTER — Encounter: Payer: Self-pay | Admitting: Hematology

## 2022-09-16 ENCOUNTER — Other Ambulatory Visit (HOSPITAL_COMMUNITY): Payer: Self-pay

## 2022-09-16 DIAGNOSIS — C8310 Mantle cell lymphoma, unspecified site: Secondary | ICD-10-CM | POA: Diagnosis not present

## 2022-09-17 ENCOUNTER — Other Ambulatory Visit: Payer: Self-pay

## 2022-09-17 ENCOUNTER — Other Ambulatory Visit (HOSPITAL_COMMUNITY): Payer: Self-pay

## 2022-09-17 MED ORDER — LEVOFLOXACIN 500 MG PO TABS
500.0000 mg | ORAL_TABLET | Freq: Every day | ORAL | 0 refills | Status: DC
  Filled 2022-09-17 (×2): qty 14, 14d supply, fill #0

## 2022-09-24 ENCOUNTER — Encounter: Payer: Self-pay | Admitting: Hematology

## 2022-09-24 ENCOUNTER — Other Ambulatory Visit: Payer: Self-pay

## 2022-09-24 ENCOUNTER — Other Ambulatory Visit (HOSPITAL_COMMUNITY): Payer: Self-pay

## 2022-09-24 DIAGNOSIS — C8318 Mantle cell lymphoma, lymph nodes of multiple sites: Secondary | ICD-10-CM

## 2022-09-25 ENCOUNTER — Other Ambulatory Visit: Payer: Self-pay

## 2022-09-25 ENCOUNTER — Inpatient Hospital Stay: Payer: 59 | Attending: Hematology

## 2022-09-25 DIAGNOSIS — Z8572 Personal history of non-Hodgkin lymphomas: Secondary | ICD-10-CM | POA: Diagnosis not present

## 2022-09-25 DIAGNOSIS — C8318 Mantle cell lymphoma, lymph nodes of multiple sites: Secondary | ICD-10-CM

## 2022-09-25 LAB — CBC WITH DIFFERENTIAL (CANCER CENTER ONLY)
Abs Immature Granulocytes: 0.01 10*3/uL (ref 0.00–0.07)
Basophils Absolute: 0 10*3/uL (ref 0.0–0.1)
Basophils Relative: 1 %
Eosinophils Absolute: 0.1 10*3/uL (ref 0.0–0.5)
Eosinophils Relative: 1 %
HCT: 42.6 % (ref 39.0–52.0)
Hemoglobin: 15.5 g/dL (ref 13.0–17.0)
Immature Granulocytes: 0 %
Lymphocytes Relative: 29 %
Lymphs Abs: 1.5 10*3/uL (ref 0.7–4.0)
MCH: 31.9 pg (ref 26.0–34.0)
MCHC: 36.4 g/dL — ABNORMAL HIGH (ref 30.0–36.0)
MCV: 87.7 fL (ref 80.0–100.0)
Monocytes Absolute: 0.4 10*3/uL (ref 0.1–1.0)
Monocytes Relative: 8 %
Neutro Abs: 3.1 10*3/uL (ref 1.7–7.7)
Neutrophils Relative %: 61 %
Platelet Count: 210 10*3/uL (ref 150–400)
RBC: 4.86 MIL/uL (ref 4.22–5.81)
RDW: 12 % (ref 11.5–15.5)
WBC Count: 5.1 10*3/uL (ref 4.0–10.5)
nRBC: 0 % (ref 0.0–0.2)

## 2022-09-25 LAB — CMP (CANCER CENTER ONLY)
ALT: 35 U/L (ref 0–44)
AST: 32 U/L (ref 15–41)
Albumin: 4.4 g/dL (ref 3.5–5.0)
Alkaline Phosphatase: 72 U/L (ref 38–126)
Anion gap: 4 — ABNORMAL LOW (ref 5–15)
BUN: 12 mg/dL (ref 8–23)
CO2: 29 mmol/L (ref 22–32)
Calcium: 9.4 mg/dL (ref 8.9–10.3)
Chloride: 108 mmol/L (ref 98–111)
Creatinine: 1.07 mg/dL (ref 0.61–1.24)
GFR, Estimated: >60 mL/min (ref >60)
Glucose, Bld: 91 mg/dL (ref 70–99)
Potassium: 4 mmol/L (ref 3.5–5.1)
Sodium: 141 mmol/L (ref 135–145)
Total Bilirubin: 0.6 mg/dL (ref 0.3–1.2)
Total Protein: 6.2 g/dL — ABNORMAL LOW (ref 6.5–8.1)

## 2022-09-25 LAB — LACTATE DEHYDROGENASE: LDH: 176 U/L (ref 98–192)

## 2022-09-28 ENCOUNTER — Other Ambulatory Visit (HOSPITAL_COMMUNITY): Payer: Self-pay

## 2022-09-28 ENCOUNTER — Other Ambulatory Visit: Payer: Self-pay

## 2022-09-28 DIAGNOSIS — D0372 Melanoma in situ of left lower limb, including hip: Secondary | ICD-10-CM | POA: Diagnosis not present

## 2022-09-28 DIAGNOSIS — L905 Scar conditions and fibrosis of skin: Secondary | ICD-10-CM | POA: Diagnosis not present

## 2022-09-28 MED ORDER — DOXYCYCLINE HYCLATE 100 MG PO CAPS
100.0000 mg | ORAL_CAPSULE | Freq: Two times a day (BID) | ORAL | 0 refills | Status: DC
  Filled 2022-09-28: qty 14, 7d supply, fill #0

## 2022-10-30 ENCOUNTER — Ambulatory Visit (HOSPITAL_COMMUNITY)
Admission: RE | Admit: 2022-10-30 | Discharge: 2022-10-30 | Disposition: A | Discharge status: Home / Self Care | Payer: 59 | Source: Ambulatory Visit | Attending: Vascular Surgery | Admitting: Vascular Surgery

## 2022-10-30 ENCOUNTER — Other Ambulatory Visit (HOSPITAL_COMMUNITY): Payer: Self-pay | Admitting: Family Medicine

## 2022-10-30 DIAGNOSIS — M79604 Pain in right leg: Secondary | ICD-10-CM | POA: Diagnosis not present

## 2022-11-03 ENCOUNTER — Other Ambulatory Visit: Payer: Self-pay

## 2022-11-17 ENCOUNTER — Other Ambulatory Visit: Payer: Self-pay

## 2022-11-17 ENCOUNTER — Inpatient Hospital Stay: Payer: 59 | Attending: Hematology

## 2022-11-17 ENCOUNTER — Encounter: Payer: Self-pay | Admitting: Hematology

## 2022-11-17 ENCOUNTER — Inpatient Hospital Stay (HOSPITAL_BASED_OUTPATIENT_CLINIC_OR_DEPARTMENT_OTHER): Payer: 59 | Admitting: Hematology

## 2022-11-17 VITALS — BP 154/94 | HR 66 | Temp 97.9°F | Resp 18 | Ht 69.0 in | Wt 198.5 lb

## 2022-11-17 DIAGNOSIS — C8318 Mantle cell lymphoma, lymph nodes of multiple sites: Secondary | ICD-10-CM

## 2022-11-17 DIAGNOSIS — D702 Other drug-induced agranulocytosis: Secondary | ICD-10-CM | POA: Diagnosis not present

## 2022-11-17 DIAGNOSIS — Z8042 Family history of malignant neoplasm of prostate: Secondary | ICD-10-CM | POA: Insufficient documentation

## 2022-11-17 DIAGNOSIS — Z8572 Personal history of non-Hodgkin lymphomas: Secondary | ICD-10-CM | POA: Insufficient documentation

## 2022-11-17 LAB — CBC WITH DIFFERENTIAL (CANCER CENTER ONLY)
Abs Immature Granulocytes: 0.02 10*3/uL (ref 0.00–0.07)
Basophils Absolute: 0.1 10*3/uL (ref 0.0–0.1)
Basophils Relative: 1 %
Eosinophils Absolute: 0.1 10*3/uL (ref 0.0–0.5)
Eosinophils Relative: 1 %
HCT: 41.6 % (ref 39.0–52.0)
Hemoglobin: 15 g/dL (ref 13.0–17.0)
Immature Granulocytes: 0 %
Lymphocytes Relative: 25 %
Lymphs Abs: 1.4 10*3/uL (ref 0.7–4.0)
MCH: 31.6 pg (ref 26.0–34.0)
MCHC: 36.1 g/dL — ABNORMAL HIGH (ref 30.0–36.0)
MCV: 87.8 fL (ref 80.0–100.0)
Monocytes Absolute: 0.4 10*3/uL (ref 0.1–1.0)
Monocytes Relative: 7 %
Neutro Abs: 3.7 10*3/uL (ref 1.7–7.7)
Neutrophils Relative %: 66 %
Platelet Count: 206 10*3/uL (ref 150–400)
RBC: 4.74 MIL/uL (ref 4.22–5.81)
RDW: 12.8 % (ref 11.5–15.5)
WBC Count: 5.7 10*3/uL (ref 4.0–10.5)
nRBC: 0 % (ref 0.0–0.2)

## 2022-11-17 LAB — CMP (CANCER CENTER ONLY)
ALT: 28 U/L (ref 0–44)
AST: 29 U/L (ref 15–41)
Albumin: 4.3 g/dL (ref 3.5–5.0)
Alkaline Phosphatase: 72 U/L (ref 38–126)
Anion gap: 6 (ref 5–15)
BUN: 16 mg/dL (ref 8–23)
CO2: 27 mmol/L (ref 22–32)
Calcium: 8.8 mg/dL — ABNORMAL LOW (ref 8.9–10.3)
Chloride: 108 mmol/L (ref 98–111)
Creatinine: 0.97 mg/dL (ref 0.61–1.24)
GFR, Estimated: >60 mL/min (ref >60)
Glucose, Bld: 78 mg/dL (ref 70–99)
Potassium: 3.9 mmol/L (ref 3.5–5.1)
Sodium: 141 mmol/L (ref 135–145)
Total Bilirubin: 0.4 mg/dL (ref 0.3–1.2)
Total Protein: 6.5 g/dL (ref 6.5–8.1)

## 2022-11-17 LAB — LACTATE DEHYDROGENASE: LDH: 151 U/L (ref 98–192)

## 2022-11-17 NOTE — Progress Notes (Signed)
HEMATOLOGY/ONCOLOGY CLINIC VISIT NOTE  Date of Service: 11/17/2022  Patient Care Team: [name], MD as PCP - General (Family Medicine) Brunetta Genera, MD as Consulting Physician (Hematology)  CHIEF COMPLAINTS/PURPOSE OF CONSULTATION:  Follow-up for continued evaluation and management of mantle cell lymphoma  HISTORY OF PRESENTING ILLNESS:  Please see previous note for details on initial presentation.  INTERVAL HISTORY:  Brian Kaiser is here today for continued evaluation and management of mantle cell lymphoma.  I had a phone visit with the patient on 08/18/2022 and he complained of hip pain which causes him sleeping problems and bilateral ankle swelling after discontinuing Prednisone.   Patient is accompanied by his wife during this visit. He reports he has been doing well overall without any new medical concerns since our last visit. Patient notes he had melanoma spot near his left leg surgically removed on 09/28/2022. He is regularly following up with his dermatologist. Patient notes he has recovered from is surgical procedure.   Patient reports he had bilateral leg swelling after his surgery. During this visit, he still has mild bilateral leg swelling and mild testicular swelling.   He denies fever, chills, night sweats, abdominal pain, chest pain, back pain, or shortness of breath.   He has received influenza vaccine, but denies RSV vaccine or COVID-19 Booster.    MEDICAL HISTORY:  Past Medical History:  Diagnosis Date   Allergy    Forearm laceration    right forearm   GERD (gastroesophageal reflux disease)    Hepatitis B    Hyperlipidemia    Pulmonary lesion    Left pulmonary schwannoma   Schwannoma    left lower lung    SURGICAL HISTORY: Past Surgical History:  Procedure Laterality Date   COLONOSCOPY     LIGAMENT REPAIR Right 04/19/2019   Right forearm   LIPOMA EXCISION Left    neck   LUNG SURGERY Left    OTHER SURGICAL HISTORY Left     pulmonary schwannoma excision   UPPER GASTROINTESTINAL ENDOSCOPY     WOUND EXPLORATION Right 04/19/2019   Procedure: WOUND EXPLORATION; REPAIR RIGHT FOREARM LACERATION;  Surgeon: Charlotte Crumb, MD;  Location: [facility];  Service: Orthopedics;  Laterality: Right;  AXILLARY BLOCK AND MAC    SOCIAL HISTORY: Social History   Socioeconomic History   Marital status: Married    Spouse name: Not on file   Number of children: Not on file   Years of education: Not on file   Highest education level: Not on file  Occupational History   Not on file  Tobacco Use   Smoking status: Never   Smokeless tobacco: Never  Vaping Use   Vaping Use: Never used  Substance and Sexual Activity   Alcohol use: No    Alcohol/week: 0.0 standard drinks of alcohol   Drug use: No   Sexual activity: Not on file  Other Topics Concern   Not on file  Social History Narrative   Not on file   Social Determinants of Health   Financial Resource Strain: Not on file  Food Insecurity: Not on file  Transportation Needs: Not on file  Physical Activity: Not on file  Stress: Not on file  Social Connections: Not on file  Intimate Partner Violence: Not on file    FAMILY HISTORY: Family History  Problem Relation Age of Onset   Macular degeneration Mother    Dementia Mother    Diverticulitis Mother    Prostate cancer Paternal Jon Gills  Diverticulitis Brother    Colon cancer Neg Hx    Colon polyps Neg Hx    Esophageal cancer Neg Hx    Liver cancer Neg Hx    Pancreatic cancer Neg Hx    Rectal cancer Neg Hx    Stomach cancer Neg Hx     ALLERGIES:  is allergic to augmentin [amoxicillin-pot clavulanate].  MEDICATIONS:  Current Outpatient Medications  Medication Sig Dispense Refill   acetaminophen (TYLENOL) 325 MG tablet Take 325 mg by mouth every 6 (six) hours as needed for moderate pain or headache.     Ascorbic Acid (VITAMIN C) 1000 MG tablet Take 1,000 mg by mouth daily.      cholecalciferol (VITAMIN D3) 25 MCG (1000 UNIT) tablet Take 1,000 Units by mouth daily.     doxycycline (VIBRAMYCIN) 100 MG capsule Take 1 capsule (100 mg total) by mouth 2 (two) times daily with a meal for 7 days. 14 capsule 0   famotidine (PEPCID) 20 MG tablet Take 1 tablet (20 mg total) by mouth at bedtime. 90 tablet 4   levofloxacin (LEVAQUIN) 500 MG tablet Take 1 tablet (500 mg total) by mouth daily for 14 days. Use if neutropenic at time of surgery. 14 tablet 0   levothyroxine (SYNTHROID) 25 MCG tablet Take 1 tablet (25 mcg total) by mouth in the morning on an empty stomach 90 tablet 4   Magnesium 200 MG TABS Take 1 tablet by mouth daily.     Multiple Vitamin (MULTIVITAMIN) tablet Take 1 tablet by mouth daily.     Multiple Vitamins-Minerals (MENS 50+ MULTI VITAMIN/MIN PO) Take 1 tablet by mouth daily.     Omega-3 Fatty Acids (FISH OIL) 1000 MG CAPS Take 1,000 mg by mouth daily.     pantoprazole (PROTONIX) 20 MG tablet Take 2 tablets (40 mg total) by mouth daily. 180 tablet 3   pantoprazole (PROTONIX) 40 MG tablet Take 1 tablet (40 mg total) by mouth daily. Take 30 minutes before supper 90 tablet 4   Current Facility-Administered Medications  Medication Dose Route Frequency Provider Last Rate Last Admin   0.9 %  sodium chloride infusion   Intravenous PRN Eileen Stanford, PA-C        REVIEW OF SYSTEMS:   10 Point review of Systems was done is negative except as noted above.  PHYSICAL EXAMINATION: .BP (!) 154/94 (BP Location: Left Arm, Patient Position: Sitting)   Pulse 66   Temp 97.9 F (36.6 C) (Oral)   Resp 18   Ht '5\' 9"'$  (1.753 m)   Wt 198 lb 8 oz (90 kg)   SpO2 100%   BMI 29.31 kg/m  . GENERAL:alert, in no acute distress and comfortable SKIN: no acute rashes, no significant lesions EYES: conjunctiva are pink and non-injected, sclera anicteric OROPHARYNX: MMM, no exudates, no oropharyngeal erythema or ulceration NECK: supple, no JVD LYMPH:  no palpable lymphadenopathy  in the cervical, axillary or inguinal regions LUNGS: clear to auscultation b/l with normal respiratory effort HEART: regular rate & rhythm ABDOMEN:  normoactive bowel sounds , non tender, not distended. Extremity: no pedal edema PSYCH: alert & oriented x 3 with fluent speech NEURO: no focal motor/sensory deficits    LABORATORY DATA:   .    Latest Ref Rng & Units 11/17/2022   12:45 PM 09/25/2022    8:20 AM 08/17/2022    4:29 PM  CBC  WBC 4.0 - 10.5 K/uL 5.7  5.1  2.1   Hemoglobin 13.0 - 17.0 g/dL 15.0  15.5  14.0   Hematocrit 39.0 - 52.0 % 41.6  42.6  39.6   Platelets 150 - 400 K/uL 206  210  178    ANC 500 .    Latest Ref Rng & Units 11/17/2022   12:45 PM 09/25/2022    8:20 AM 08/17/2022    4:29 PM  CMP  Glucose 70 - 99 mg/dL 78  91  92   BUN 8 - 23 mg/dL '16  12  17   '$ Creatinine 0.61 - 1.24 mg/dL 0.97  1.07  0.95   Sodium 135 - 145 mmol/L 141  141  140   Potassium 3.5 - 5.1 mmol/L 3.9  4.0  3.8   Chloride 98 - 111 mmol/L 108  108  107   CO2 22 - 32 mmol/L '27  29  26   '$ Calcium 8.9 - 10.3 mg/dL 8.8  9.4  9.1   Total Protein 6.5 - 8.1 g/dL 6.5  6.2  6.7   Total Bilirubin 0.3 - 1.2 mg/dL 0.4  0.6  0.7   Alkaline Phos 38 - 126 U/L 72  72  108   AST 15 - 41 U/L 29  32  33   ALT 0 - 44 U/L 28  35  30    . Lab Results  Component Value Date   LDH 151 11/17/2022    06/20/2020 Right Axillary Surgical Pathology Report [id number]):    06/20/2020 Right Axillary Lymph Node Flow Pathology ([id number]):   06/11/2020 Flow Pathology ([id number]):   RADIOGRAPHIC STUDIES: I have personally reviewed the radiological images as listed and agreed with the findings in the report. VAS [place] LOWER EXTREMITY VENOUS (DVT)  Result Date: 10/30/2022  Lower Venous DVT Study Patient Name:  KOBIN DETORO  Date of Exam:   10/30/2022 Medical Rec #: [id number]     Accession #:    [id number] Date of Birth: [birth year]    Patient Gender: M Patient Age:   23 years Exam [facility] Procedure:      VAS [place] LOWER EXTREMITY VENOUS (DVT) Referring Phys: [name] --------------------------------------------------------------------------------  Indications: Pain.  Risk Factors: Surgery melanoma excision in January. Performing Technologist: Ralene Cork RVT  Examination Guidelines: A complete evaluation includes B-mode imaging, spectral Doppler, color Doppler, and power Doppler as needed of all accessible portions of each vessel. Bilateral testing is considered an integral part of a complete examination. Limited examinations for reoccurring indications may be performed as noted. The reflux portion of the exam is performed with the patient in reverse Trendelenburg.  +---------+---------------+---------+-----------+----------+--------------+ RIGHT    CompressibilityPhasicitySpontaneityPropertiesThrombus Aging +---------+---------------+---------+-----------+----------+--------------+ CFV      Full           Yes      Yes                                 +---------+---------------+---------+-----------+----------+--------------+ SFJ      Full                    Yes                                 +---------+---------------+---------+-----------+----------+--------------+ FV Prox  Full           Yes      Yes                                 +---------+---------------+---------+-----------+----------+--------------+  FV Mid   Full           Yes      Yes                                 +---------+---------------+---------+-----------+----------+--------------+ FV DistalFull           Yes      Yes                                 +---------+---------------+---------+-----------+----------+--------------+ POP      Full           Yes      Yes                                 +---------+---------------+---------+-----------+----------+--------------+ PTV      Full                    Yes                                  +---------+---------------+---------+-----------+----------+--------------+ PERO     Full                    Yes                                 +---------+---------------+---------+-----------+----------+--------------+ GSV      Full           Yes      Yes                                 +---------+---------------+---------+-----------+----------+--------------+ SSV      Full                    Yes                                 +---------+---------------+---------+-----------+----------+--------------+ Findings reported to Amy at 3:00 pm.  Summary: RIGHT: - There is no evidence of deep vein thrombosis in the lower extremity. - There is no evidence of superficial venous thrombosis.   *See table(s) above for measurements and observations. Electronically signed by Orlie Pollen on 10/30/2022 at 3:52:28 PM.    Final     ASSESSMENT & PLAN:   67 yo with   1) stage IV mantle cell lymphoma with extensive lymphadenopathy and massive splenomegaly . 2) s/p massive splenomegaly likely related to mantle cell lymphoma  3) Thrombocytopenia-mild platelets of 210k likely related to lymphoma. - resolved 4) hepatitis B core antibody positive, surface antigen negative, HBV DNA PCR negative 5) Neutropenia - intermitent fluctuating-- likely from  immunologic changes from Rituxan use. No fevers. Now resolved.  PLAN: -Discussed lab results from today, 11/17/2022, with the patient and his wife. CBC and CMP is stable. Neutropenia has completely resolved. LDH WNL -no further maintenance Rituxan at this time. - no lab or clinical symptoms/signs suggestive of lymphoma recurrence/progression at this time. -no indication for rpt G-CSF -Will get lab in 3 months. -recommend to maintain a healthy diet.  -recommended to  stay hydrated.  -Recommended COVID-19 Booster and RSV vaccine.   FOLLOW UP: RTC with Dr Irene Limbo with labs in 4 months  The total time spent in the appointment was 23 minutes* .  All of  the patient's questions were answered with apparent satisfaction. The patient knows to call the clinic with any problems, questions or concerns.   Sullivan Lone MD MS AAHIVMS [facility] Hematology/Oncology Physician [facility]  .*Total Encounter Time as defined by the Centers for Medicare and Medicaid Services includes, in addition to the face-to-face time of a patient visit (documented in the note above) non-face-to-face time: obtaining and reviewing outside history, ordering and reviewing medications, tests or procedures, care coordination (communications with other health care professionals or caregivers) and documentation in the medical record.   I, Cleda Mccreedy, am acting as a Education administrator for Sullivan Lone, MD. .I have reviewed the above documentation for accuracy and completeness, and I agree with the above. Brunetta Genera MD

## 2022-11-18 ENCOUNTER — Telehealth: Payer: Self-pay | Admitting: Hematology

## 2022-11-18 NOTE — Telephone Encounter (Signed)
Called patient per 2/27 los notes to schedule f/u. Patient scheduled and notified.

## 2022-11-23 ENCOUNTER — Encounter: Payer: Self-pay | Admitting: Gastroenterology

## 2022-11-23 ENCOUNTER — Encounter: Payer: Self-pay | Admitting: Hematology

## 2022-12-09 ENCOUNTER — Telehealth: Payer: Self-pay | Admitting: Hematology

## 2022-12-09 NOTE — Telephone Encounter (Signed)
Called patient per provider PAL to reschedule 5/24 appointments. Patient rescheduled and notified.

## 2023-01-05 DIAGNOSIS — K629 Disease of anus and rectum, unspecified: Secondary | ICD-10-CM | POA: Diagnosis not present

## 2023-01-06 DIAGNOSIS — L814 Other melanin hyperpigmentation: Secondary | ICD-10-CM | POA: Diagnosis not present

## 2023-01-06 DIAGNOSIS — L570 Actinic keratosis: Secondary | ICD-10-CM | POA: Diagnosis not present

## 2023-01-06 DIAGNOSIS — Z85828 Personal history of other malignant neoplasm of skin: Secondary | ICD-10-CM | POA: Diagnosis not present

## 2023-01-06 DIAGNOSIS — L821 Other seborrheic keratosis: Secondary | ICD-10-CM | POA: Diagnosis not present

## 2023-01-06 DIAGNOSIS — D2371 Other benign neoplasm of skin of right lower limb, including hip: Secondary | ICD-10-CM | POA: Diagnosis not present

## 2023-01-06 DIAGNOSIS — Z08 Encounter for follow-up examination after completed treatment for malignant neoplasm: Secondary | ICD-10-CM | POA: Diagnosis not present

## 2023-01-06 DIAGNOSIS — Z85820 Personal history of malignant melanoma of skin: Secondary | ICD-10-CM | POA: Diagnosis not present

## 2023-01-29 ENCOUNTER — Other Ambulatory Visit: Payer: Self-pay

## 2023-01-29 ENCOUNTER — Other Ambulatory Visit (HOSPITAL_COMMUNITY): Payer: Self-pay

## 2023-01-29 ENCOUNTER — Encounter: Payer: Self-pay | Admitting: Hematology

## 2023-01-29 ENCOUNTER — Encounter: Payer: Self-pay | Admitting: Pharmacist

## 2023-02-05 ENCOUNTER — Inpatient Hospital Stay (HOSPITAL_BASED_OUTPATIENT_CLINIC_OR_DEPARTMENT_OTHER): Payer: 59 | Admitting: Hematology

## 2023-02-05 ENCOUNTER — Inpatient Hospital Stay: Payer: 59 | Attending: Hematology

## 2023-02-05 ENCOUNTER — Encounter: Payer: Self-pay | Admitting: Hematology

## 2023-02-05 VITALS — BP 174/86 | HR 77 | Temp 97.9°F | Resp 16 | Wt 193.2 lb

## 2023-02-05 DIAGNOSIS — Z8042 Family history of malignant neoplasm of prostate: Secondary | ICD-10-CM | POA: Diagnosis not present

## 2023-02-05 DIAGNOSIS — C8318 Mantle cell lymphoma, lymph nodes of multiple sites: Secondary | ICD-10-CM

## 2023-02-05 DIAGNOSIS — Z8572 Personal history of non-Hodgkin lymphomas: Secondary | ICD-10-CM | POA: Diagnosis not present

## 2023-02-05 DIAGNOSIS — Z85820 Personal history of malignant melanoma of skin: Secondary | ICD-10-CM | POA: Diagnosis not present

## 2023-02-05 LAB — CMP (CANCER CENTER ONLY)
ALT: 28 U/L (ref 0–44)
AST: 27 U/L (ref 15–41)
Albumin: 4.6 g/dL (ref 3.5–5.0)
Alkaline Phosphatase: 76 U/L (ref 38–126)
Anion gap: 5 (ref 5–15)
BUN: 17 mg/dL (ref 8–23)
CO2: 30 mmol/L (ref 22–32)
Calcium: 9.4 mg/dL (ref 8.9–10.3)
Chloride: 107 mmol/L (ref 98–111)
Creatinine: 1.14 mg/dL (ref 0.61–1.24)
GFR, Estimated: >60 mL/min (ref >60)
Glucose, Bld: 92 mg/dL (ref 70–99)
Potassium: 4.3 mmol/L (ref 3.5–5.1)
Sodium: 142 mmol/L (ref 135–145)
Total Bilirubin: 0.5 mg/dL (ref 0.3–1.2)
Total Protein: 6.8 g/dL (ref 6.5–8.1)

## 2023-02-05 LAB — CBC WITH DIFFERENTIAL (CANCER CENTER ONLY)
Abs Immature Granulocytes: 0.02 10*3/uL (ref 0.00–0.07)
Basophils Absolute: 0.1 10*3/uL (ref 0.0–0.1)
Basophils Relative: 1 %
Eosinophils Absolute: 0.1 10*3/uL (ref 0.0–0.5)
Eosinophils Relative: 1 %
HCT: 42.5 % (ref 39.0–52.0)
Hemoglobin: 15.9 g/dL (ref 13.0–17.0)
Immature Granulocytes: 0 %
Lymphocytes Relative: 24 %
Lymphs Abs: 1.4 10*3/uL (ref 0.7–4.0)
MCH: 33.1 pg (ref 26.0–34.0)
MCHC: 37.4 g/dL — ABNORMAL HIGH (ref 30.0–36.0)
MCV: 88.4 fL (ref 80.0–100.0)
Monocytes Absolute: 0.4 10*3/uL (ref 0.1–1.0)
Monocytes Relative: 7 %
Neutro Abs: 3.9 10*3/uL (ref 1.7–7.7)
Neutrophils Relative %: 67 %
Platelet Count: 203 10*3/uL (ref 150–400)
RBC: 4.81 MIL/uL (ref 4.22–5.81)
RDW: 12 % (ref 11.5–15.5)
WBC Count: 5.8 10*3/uL (ref 4.0–10.5)
nRBC: 0 % (ref 0.0–0.2)

## 2023-02-05 LAB — LACTATE DEHYDROGENASE: LDH: 158 U/L (ref 98–192)

## 2023-02-05 NOTE — Progress Notes (Signed)
HEMATOLOGY/ONCOLOGY CLINIC VISIT NOTE  Date of Service: 02/05/2023  Patient Care Team: Blair Heys, MD as PCP - General (Family Medicine) [name], MD as Consulting Physician (Hematology)  CHIEF COMPLAINTS/PURPOSE OF CONSULTATION:  Follow-up for continued evaluation and management of mantle cell lymphoma  HISTORY OF PRESENTING ILLNESS:  Please see previous note for details on initial presentation.  INTERVAL HISTORY:  Brian Kaiser is a 67 y.o. male here today for continued evaluation and management of mantle cell lymphoma. Patient was last seen by me on 11/17/2022 and reported surgical removal of melanoma spot in left LE. Patient endorsed mild bilateral leg and testicular swelling.   Today, he is accompanied by his wife. He complains of swelling in his hands and feet which has been intermittent. He notices puffiness in the bottom of his bilateral feet after generally walking 2.25 miles. He reports wearing comfortable shoes and does not use compression socks. He also reports some mild hip pain. He does not tend to to endorse bothersome seasonal allergies.   He denies any new lumps/bumps, fevers, chills, night sweat, infection issues, chest pain, SOB, abdominal pain, or major medication changes. He continues to take low-dose Synthroid regularly.  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  MEDICAL HISTORY:  Past Medical History:  Diagnosis Date   Allergy    Forearm laceration    right forearm   GERD (gastroesophageal reflux disease)    Hepatitis B    Hyperlipidemia    Pulmonary lesion    Left pulmonary schwannoma   Schwannoma    left lower lung    SURGICAL HISTORY: Past Surgical History:  Procedure Laterality Date   COLONOSCOPY     LIGAMENT REPAIR Right 04/19/2019   Right forearm   LIPOMA EXCISION Left    neck   LUNG SURGERY Left    OTHER SURGICAL HISTORY Left    pulmonary schwannoma excision   UPPER GASTROINTESTINAL ENDOSCOPY      WOUND EXPLORATION Right 04/19/2019   Procedure: WOUND EXPLORATION; REPAIR RIGHT FOREARM LACERATION;  Surgeon: Dairl Ponder, MD;  Location: [facility];  Service: Orthopedics;  Laterality: Right;  AXILLARY BLOCK AND MAC    SOCIAL HISTORY: Social History   Socioeconomic History   Marital status: Married    Spouse name: Not on file   Number of children: Not on file   Years of education: Not on file   Highest education level: Not on file  Occupational History   Not on file  Tobacco Use   Smoking status: Never   Smokeless tobacco: Never  Vaping Use   Vaping Use: Never used  Substance and Sexual Activity   Alcohol use: No    Alcohol/week: 0.0 standard drinks of alcohol   Drug use: No   Sexual activity: Not on file  Other Topics Concern   Not on file  Social History Narrative   Not on file   Social Determinants of Health   Financial Resource Strain: Not on file  Food Insecurity: Not on file  Transportation Needs: Not on file  Physical Activity: Not on file  Stress: Not on file  Social Connections: Not on file  Intimate Partner Violence: Not on file    FAMILY HISTORY: Family History  Problem Relation Age of Onset   Macular degeneration Mother    Dementia Mother    Diverticulitis Mother    Prostate cancer Paternal Grandfather    Diverticulitis Brother    Colon cancer Neg Hx  Colon polyps Neg Hx    Esophageal cancer Neg Hx    Liver cancer Neg Hx    Pancreatic cancer Neg Hx    Rectal cancer Neg Hx    Stomach cancer Neg Hx     ALLERGIES:  is allergic to augmentin [amoxicillin-pot clavulanate].  MEDICATIONS:  Current Outpatient Medications  Medication Sig Dispense Refill   acetaminophen (TYLENOL) 325 MG tablet Take 325 mg by mouth every 6 (six) hours as needed for moderate pain or headache.     Ascorbic Acid (VITAMIN C) 1000 MG tablet Take 1,000 mg by mouth daily.     cholecalciferol (VITAMIN D3) 25 MCG (1000 UNIT) tablet Take 1,000 Units by  mouth daily.     doxycycline (VIBRAMYCIN) 100 MG capsule Take 1 capsule (100 mg total) by mouth 2 (two) times daily with a meal for 7 days. 14 capsule 0   famotidine (PEPCID) 20 MG tablet Take 1 tablet (20 mg total) by mouth at bedtime. 90 tablet 4   levofloxacin (LEVAQUIN) 500 MG tablet Take 1 tablet (500 mg total) by mouth daily for 14 days. Use if neutropenic at time of surgery. 14 tablet 0   levothyroxine (SYNTHROID) 25 MCG tablet Take 1 tablet (25 mcg total) by mouth in the morning on an empty stomach 90 tablet 4   Magnesium 200 MG TABS Take 1 tablet by mouth daily.     Multiple Vitamin (MULTIVITAMIN) tablet Take 1 tablet by mouth daily.     Multiple Vitamins-Minerals (MENS 50+ MULTI VITAMIN/MIN PO) Take 1 tablet by mouth daily.     Omega-3 Fatty Acids (FISH OIL) 1000 MG CAPS Take 1,000 mg by mouth daily.     pantoprazole (PROTONIX) 20 MG tablet Take 2 tablets (40 mg total) by mouth daily. 180 tablet 3   pantoprazole (PROTONIX) 40 MG tablet Take 1 tablet (40 mg total) by mouth daily. Take 30 minutes before supper 90 tablet 4   Current Facility-Administered Medications  Medication Dose Route Frequency Provider Last Rate Last Admin   0.9 %  sodium chloride infusion   Intravenous PRN Janetta Hora, PA-C        REVIEW OF SYSTEMS:    10 Point review of Systems was done is negative except as noted above.   PHYSICAL EXAMINATION: .There were no vitals taken for this visit.  GENERAL:alert, in no acute distress and comfortable SKIN: no acute rashes, no significant lesions EYES: conjunctiva are pink and non-injected, sclera anicteric OROPHARYNX: MMM, no exudates, no oropharyngeal erythema or ulceration NECK: supple, no JVD LYMPH:  no palpable lymphadenopathy in the cervical, axillary or inguinal regions LUNGS: clear to auscultation b/l with normal respiratory effort HEART: regular rate & rhythm ABDOMEN:  normoactive bowel sounds , non tender, not distended. Extremity: no pedal  edema PSYCH: alert & oriented x 3 with fluent speech NEURO: no focal motor/sensory deficits   LABORATORY DATA:   .    Latest Ref Rng & Units 02/05/2023    2:30 PM 11/17/2022   12:45 PM 09/25/2022    8:20 AM  CBC  WBC 4.0 - 10.5 K/uL 5.8  5.7  5.1   Hemoglobin 13.0 - 17.0 g/dL 65.7  84.6  96.2   Hematocrit 39.0 - 52.0 % 42.5  41.6  42.6   Platelets 150 - 400 K/uL 203  206  210    ANC 500 .    Latest Ref Rng & Units 11/17/2022   12:45 PM 09/25/2022    8:20 AM 08/17/2022  4:29 PM  CMP  Glucose 70 - 99 mg/dL 78  91  92   BUN 8 - 23 mg/dL 16  12  17    Creatinine 0.61 - 1.24 mg/dL 4.09  8.11  9.14   Sodium 135 - 145 mmol/L 141  141  140   Potassium 3.5 - 5.1 mmol/L 3.9  4.0  3.8   Chloride 98 - 111 mmol/L 108  108  107   CO2 22 - 32 mmol/L 27  29  26    Calcium 8.9 - 10.3 mg/dL 8.8  9.4  9.1   Total Protein 6.5 - 8.1 g/dL 6.5  6.2  6.7   Total Bilirubin 0.3 - 1.2 mg/dL 0.4  0.6  0.7   Alkaline Phos 38 - 126 U/L 72  72  108   AST 15 - 41 U/L 29  32  33   ALT 0 - 44 U/L 28  35  30    . Lab Results  Component Value Date   LDH 151 11/17/2022    06/20/2020 Right Axillary Surgical Pathology Report [id number]):    06/20/2020 Right Axillary Lymph Node Flow Pathology ([id number]):   06/11/2020 Flow Pathology ([id number]):   RADIOGRAPHIC STUDIES: I have personally reviewed the radiological images as listed and agreed with the findings in the report. No results found.  ASSESSMENT & PLAN:   67 yo with   1) stage IV mantle cell lymphoma with extensive lymphadenopathy and massive splenomegaly . 2) s/p massive splenomegaly likely related to mantle cell lymphoma  3) Thrombocytopenia-mild platelets of 210k likely related to lymphoma. - resolved 4) hepatitis B core antibody positive, surface antigen negative, HBV DNA PCR negative 5) Neutropenia - intermitent fluctuating-- likely from  immunologic changes from Rituxan use. No fevers. Now  resolved.  PLAN:     -Discussed lab results on 02/05/2023 in detail with patient. CBC normal, showed WBC of 5.8K, hemoglobin of 15.9, and platelets of 203K. -no neutropenia -CMP pending -LDH pending -no further maintenance Rituxan at this time. - no lab or clinical symptoms/signs suggestive of lymphoma recurrence/progression at this time. -no indication for rpt G-CSF -patient continues to be in remission at this time -recommend patient to regularly use graded sports compression socks to keep swelling down -recommend patient to limit salt-intake to help with LE edema -informed patient that certain medications may contribute to LE edema -continue to monitor with labs in 4-6 months  FOLLOW UP: ***  The total time spent in the appointment was *** minutes* .  All of the patient's questions were answered with apparent satisfaction. The patient knows to call the clinic with any problems, questions or concerns.   Wyvonnia Lora MD MS AAHIVMS [facility] Hematology/Oncology Physician [facility]  .*Total Encounter Time as defined by the Centers for Medicare and Medicaid Services includes, in addition to the face-to-face time of a patient visit (documented in the note above) non-face-to-face time: obtaining and reviewing outside history, ordering and reviewing medications, tests or procedures, care coordination (communications with other health care professionals or caregivers) and documentation in the medical record.    I,Mitra Faeizi,acting as a Neurosurgeon for Wyvonnia Lora, MD.,have documented all relevant documentation on the behalf of Wyvonnia Lora, MD,as directed by  Wyvonnia Lora, MD while in the presence of Wyvonnia Lora, MD.  ***

## 2023-02-11 ENCOUNTER — Encounter: Payer: Self-pay | Admitting: Hematology

## 2023-02-12 ENCOUNTER — Ambulatory Visit: Payer: 59 | Admitting: Hematology

## 2023-02-12 ENCOUNTER — Other Ambulatory Visit: Payer: 59

## 2023-02-18 ENCOUNTER — Other Ambulatory Visit: Payer: Self-pay

## 2023-03-05 ENCOUNTER — Telehealth: Payer: Self-pay | Admitting: Hematology

## 2023-03-05 NOTE — Telephone Encounter (Signed)
Patient is aware of upcoming appointment date/times.  

## 2023-03-10 ENCOUNTER — Ambulatory Visit: Payer: 59 | Admitting: Gastroenterology

## 2023-03-23 ENCOUNTER — Encounter: Payer: Self-pay | Admitting: Hematology

## 2023-04-15 ENCOUNTER — Encounter: Payer: Self-pay | Admitting: Hematology

## 2023-04-22 ENCOUNTER — Other Ambulatory Visit (HOSPITAL_COMMUNITY): Payer: Self-pay

## 2023-04-22 ENCOUNTER — Encounter: Payer: Self-pay | Admitting: Hematology

## 2023-04-22 ENCOUNTER — Ambulatory Visit (INDEPENDENT_AMBULATORY_CARE_PROVIDER_SITE_OTHER): Payer: Managed Care, Other (non HMO) | Admitting: Gastroenterology

## 2023-04-22 ENCOUNTER — Encounter: Payer: Self-pay | Admitting: Gastroenterology

## 2023-04-22 ENCOUNTER — Other Ambulatory Visit: Payer: Self-pay

## 2023-04-22 VITALS — BP 132/82 | HR 61 | Ht 69.0 in | Wt 192.0 lb

## 2023-04-22 DIAGNOSIS — R0982 Postnasal drip: Secondary | ICD-10-CM | POA: Diagnosis not present

## 2023-04-22 DIAGNOSIS — K219 Gastro-esophageal reflux disease without esophagitis: Secondary | ICD-10-CM | POA: Diagnosis not present

## 2023-04-22 DIAGNOSIS — R0989 Other specified symptoms and signs involving the circulatory and respiratory systems: Secondary | ICD-10-CM

## 2023-04-22 DIAGNOSIS — R490 Dysphonia: Secondary | ICD-10-CM

## 2023-04-22 MED ORDER — PANTOPRAZOLE SODIUM 40 MG PO TBEC
40.0000 mg | DELAYED_RELEASE_TABLET | Freq: Every day | ORAL | 4 refills | Status: DC
  Filled 2023-04-22 – 2023-05-03 (×2): qty 90, 90d supply, fill #0
  Filled 2023-10-08: qty 90, 90d supply, fill #1

## 2023-04-22 MED ORDER — FAMOTIDINE 20 MG PO TABS
20.0000 mg | ORAL_TABLET | Freq: Every day | ORAL | 4 refills | Status: AC
  Filled 2023-04-22 – 2023-05-03 (×2): qty 90, 90d supply, fill #0

## 2023-04-22 NOTE — Progress Notes (Signed)
Chief Complaint: FU  Referring Provider:  Blair Heys, MD      ASSESSMENT AND PLAN;   #1. GERD. No dysphagia.  Plan:  -Continue Pepcid 20mg  po QHS -Protonix 40mg  po every day PRN #90,4RF -FU as needed. If any problems, rpt EGD   HPI:    Brian Kaiser is a 67 y.o. male  With Hepatitis B, LLL Schwanoma s/p lobectomy, hypothyroidism, stage IV mantle cell lymphoma in remission (on rituximab, on hold d/t neutropenia, neg PET 12/2020), being followed by Dr. Nolen Mu at [facility]  For follow-up visit Has lost 6lb over the last 6 mnts. Feels better Has been taking Protonix on as-needed basis Taking Pepcid at night  His symptoms are much better-almost resolved  Has not seen ENT or allergy specialist.  No odynophagia or dysphagia.  No melena or hematochezia.  He denies having any diarrhea or constipation. Wt Readings from Last 3 Encounters:  04/22/23 192 lb (87.1 kg)  02/05/23 193 lb 3.2 oz (87.6 kg)  11/17/22 198 lb 8 oz (90 kg)     Past GI workup:  EGD 08/04/2019 - LA Grade C reflux esophagitis with no bleeding. - Benign-appearing esophageal stenosis. - Small hiatal hernia. - Erythematous mucosa in the prepyloric region of the stomach. Biopsied. - Erythematous duodenopathy.  EGD 09/26/2014 -NL -Dil 52Fr  Colonoscopy 12/2017 - One 1 mm polyp at the ileocecal valve, removed with a cold biopsy forceps. Resected and retrieved. - One 5 mm polyp in the sigmoid colon, removed with a cold snare. Resected and retrieved. - Mild diverticulosis in the sigmoid colon and in the ascending colon. - Bx: neg. Repeat colonoscopy in 10 years  CT CAP 05/2020 -Extensive adenopathy in the chest, abdomen and pelvis. Marked splenomegaly   PET 01/13/2021 1. No evidence of recurrent lymphoma. 2.  Aortic atherosclerosis (ICD10-I70.0)   SH: Married Building surveyor), works in Consulting civil engineer at [employer], 2 daughters  Past Medical History:  Diagnosis Date   Allergy    Forearm laceration    right  forearm   GERD (gastroesophageal reflux disease)    Hepatitis B    Hyperlipidemia    Pulmonary lesion    Left pulmonary schwannoma   Schwannoma    left lower lung    Past Surgical History:  Procedure Laterality Date   COLONOSCOPY     LIGAMENT REPAIR Right 04/19/2019   Right forearm   LIPOMA EXCISION Left    neck   LUNG SURGERY Left    OTHER SURGICAL HISTORY Left    pulmonary schwannoma excision   UPPER GASTROINTESTINAL ENDOSCOPY     WOUND EXPLORATION Right 04/19/2019   Procedure: WOUND EXPLORATION; REPAIR RIGHT FOREARM LACERATION;  Surgeon: Dairl Ponder, MD;  Location: [facility];  Service: Orthopedics;  Laterality: Right;  AXILLARY BLOCK AND MAC    Family History  Problem Relation Age of Onset   Macular degeneration Mother    Dementia Mother    Diverticulitis Mother    Prostate cancer Paternal Grandfather    Diverticulitis Brother    Colon cancer Neg Hx    Colon polyps Neg Hx    Esophageal cancer Neg Hx    Liver cancer Neg Hx    Pancreatic cancer Neg Hx    Rectal cancer Neg Hx    Stomach cancer Neg Hx     Social History   Tobacco Use   Smoking status: Never   Smokeless tobacco: Never  Vaping Use   Vaping status: Never Used  Substance Use Topics  Alcohol use: No    Alcohol/week: 0.0 standard drinks of alcohol   Drug use: No    Current Outpatient Medications  Medication Sig Dispense Refill   acetaminophen (TYLENOL) 325 MG tablet Take 325 mg by mouth every 6 (six) hours as needed for moderate pain or headache.     Ascorbic Acid (VITAMIN C) 1000 MG tablet Take 1,000 mg by mouth daily.     cholecalciferol (VITAMIN D3) 25 MCG (1000 UNIT) tablet Take 1,000 Units by mouth daily.     famotidine (PEPCID) 20 MG tablet Take 1 tablet (20 mg total) by mouth at bedtime. 90 tablet 4   levothyroxine (SYNTHROID) 25 MCG tablet Take 1 tablet (25 mcg total) by mouth in the morning on an empty stomach 90 tablet 4   Magnesium 200 MG TABS Take 1 tablet by  mouth daily.     Multiple Vitamin (MULTIVITAMIN) tablet Take 1 tablet by mouth daily.     Multiple Vitamins-Minerals (MENS 50+ MULTI VITAMIN/MIN PO) Take 1 tablet by mouth daily.     Omega-3 Fatty Acids (FISH OIL) 1000 MG CAPS Take 1,000 mg by mouth daily.     pantoprazole (PROTONIX) 40 MG tablet Take 1 tablet (40 mg total) by mouth daily. Take 30 minutes before supper 90 tablet 4   Current Facility-Administered Medications  Medication Dose Route Frequency Provider Last Rate Last Admin   0.9 %  sodium chloride infusion   Intravenous PRN Janetta Hora, PA-C        Allergies  Allergen Reactions   Augmentin [Amoxicillin-Pot Clavulanate] Rash    Pt states he had a rash from augmentin 29 years ago.    Review of Systems:  No night sweats.     Physical Exam:    BP 132/82   Pulse 61   Ht 5\' 9"  (1.753 m)   Wt 192 lb (87.1 kg)   SpO2 98%   BMI 28.35 kg/m  Wt Readings from Last 3 Encounters:  04/22/23 192 lb (87.1 kg)  02/05/23 193 lb 3.2 oz (87.6 kg)  11/17/22 198 lb 8 oz (90 kg)   Constitutional:  Well-developed, in no acute distress. Psychiatric: Normal mood and affect. Behavior is normal. HEENT: Conjunctivae are normal. No scleral icterus.  No thrush. Cardiovascular: Normal rate, regular rhythm. No edema Pulmonary/chest: Effort normal and breath sounds normal. No wheezing, rales or rhonchi. Abdominal: Soft, nondistended. Nontender. Bowel sounds active throughout. There are no masses palpable. No hepatomegaly. Rectal: Deferred Neurological: Alert and oriented to person place and time. Skin: Skin is warm and dry. No rashes noted.  Data Reviewed: I have personally reviewed following labs and imaging studies  CBC:    Latest Ref Rng & Units 02/05/2023    2:30 PM 11/17/2022   12:45 PM 09/25/2022    8:20 AM  CBC  WBC 4.0 - 10.5 K/uL 5.8  5.7  5.1   Hemoglobin 13.0 - 17.0 g/dL 95.6  21.3  08.6   Hematocrit 39.0 - 52.0 % 42.5  41.6  42.6   Platelets 150 - 400 K/uL 203  206   210     CMP:    Latest Ref Rng & Units 02/05/2023    2:30 PM 11/17/2022   12:45 PM 09/25/2022    8:20 AM  CMP  Glucose 70 - 99 mg/dL 92  78  91   BUN 8 - 23 mg/dL 17  16  12    Creatinine 0.61 - 1.24 mg/dL 5.78  4.69  6.29   Sodium  135 - 145 mmol/L 142  141  141   Potassium 3.5 - 5.1 mmol/L 4.3  3.9  4.0   Chloride 98 - 111 mmol/L 107  108  108   CO2 22 - 32 mmol/L 30  27  29    Calcium 8.9 - 10.3 mg/dL 9.4  8.8  9.4   Total Protein 6.5 - 8.1 g/dL 6.8  6.5  6.2   Total Bilirubin 0.3 - 1.2 mg/dL 0.5  0.4  0.6   Alkaline Phos 38 - 126 U/L 76  72  72   AST 15 - 41 U/L 27  29  32   ALT 0 - 44 U/L 28  28  35        Edman Circle, MD 04/22/2023, 9:54 AM  Cc: Blair Heys, MD

## 2023-04-22 NOTE — Patient Instructions (Signed)
_______________________________________________________  If your blood pressure at your visit was 140/90 or greater, please contact your primary care physician to follow up on this.  _______________________________________________________  If you are age 66 or older, your body mass index should be between 23-30. Your Body mass index is 28.35 kg/m. If this is out of the aforementioned range listed, please consider follow up with your Primary Care Provider.  If you are age 42 or younger, your body mass index should be between 19-25. Your Body mass index is 28.35 kg/m. If this is out of the aformentioned range listed, please consider follow up with your Primary Care Provider.   ________________________________________________________  The [facility] providers would like to encourage you to use [facility] to communicate with providers for non-urgent requests or questions.  Due to long hold times on the telephone, sending your provider a message by [facility] may be a faster and more efficient way to get a response.  Please allow 48 business hours for a response.  Please remember that this is for non-urgent requests.  _______________________________________________________  Continue Protonix and Pepcid  Thank you,  Dr. Lynann Bologna

## 2023-05-03 ENCOUNTER — Encounter: Payer: Self-pay | Admitting: Hematology

## 2023-05-03 ENCOUNTER — Other Ambulatory Visit: Payer: Self-pay

## 2023-05-03 ENCOUNTER — Other Ambulatory Visit (HOSPITAL_COMMUNITY): Payer: Self-pay

## 2023-05-04 ENCOUNTER — Other Ambulatory Visit: Payer: Self-pay

## 2023-05-04 ENCOUNTER — Other Ambulatory Visit (HOSPITAL_COMMUNITY): Payer: Self-pay

## 2023-05-04 ENCOUNTER — Encounter: Payer: Self-pay | Admitting: Hematology

## 2023-05-04 MED ORDER — TADALAFIL 20 MG PO TABS
10.0000 mg | ORAL_TABLET | Freq: Every day | ORAL | 3 refills | Status: DC | PRN
  Filled 2023-05-04: qty 10, 10d supply, fill #0
  Filled 2023-07-09: qty 10, 10d supply, fill #1
  Filled 2023-07-27: qty 10, 10d supply, fill #2
  Filled 2023-08-18: qty 10, 10d supply, fill #3

## 2023-05-04 MED ORDER — LEVOTHYROXINE SODIUM 25 MCG PO TABS
ORAL_TABLET | ORAL | 4 refills | Status: DC
  Filled 2023-05-04: qty 90, 90d supply, fill #0

## 2023-05-05 ENCOUNTER — Other Ambulatory Visit (HOSPITAL_COMMUNITY): Payer: Self-pay

## 2023-05-05 MED ORDER — LEVOTHYROXINE SODIUM 50 MCG PO TABS
50.0000 ug | ORAL_TABLET | Freq: Every morning | ORAL | 3 refills | Status: DC
  Filled 2023-05-05: qty 90, 90d supply, fill #0
  Filled 2023-07-25 – 2023-07-27 (×2): qty 90, 90d supply, fill #1
  Filled 2023-10-08: qty 90, 90d supply, fill #2
  Filled 2024-01-12: qty 90, 90d supply, fill #3

## 2023-05-18 ENCOUNTER — Ambulatory Visit (INDEPENDENT_AMBULATORY_CARE_PROVIDER_SITE_OTHER): Payer: Managed Care, Other (non HMO) | Admitting: Orthopaedic Surgery

## 2023-05-18 ENCOUNTER — Other Ambulatory Visit (INDEPENDENT_AMBULATORY_CARE_PROVIDER_SITE_OTHER): Payer: Managed Care, Other (non HMO)

## 2023-05-18 ENCOUNTER — Encounter: Payer: Self-pay | Admitting: Orthopaedic Surgery

## 2023-05-18 DIAGNOSIS — M25551 Pain in right hip: Secondary | ICD-10-CM | POA: Diagnosis not present

## 2023-05-18 DIAGNOSIS — M7061 Trochanteric bursitis, right hip: Secondary | ICD-10-CM | POA: Diagnosis not present

## 2023-05-18 MED ORDER — METHYLPREDNISOLONE ACETATE 40 MG/ML IJ SUSP
40.0000 mg | INTRAMUSCULAR | Status: AC | PRN
  Administered 2023-05-18: 40 mg via INTRA_ARTICULAR

## 2023-05-18 MED ORDER — LIDOCAINE HCL 1 % IJ SOLN
3.0000 mL | INTRAMUSCULAR | Status: AC | PRN
  Administered 2023-05-18: 3 mL

## 2023-05-18 NOTE — Progress Notes (Signed)
The patient is a 67 year old gentleman I am seeing for the first time.  He is a patient of Dr. Manus Gunning.  He sent to [place] to evaluate right hip pain.  He points to the lateral aspect of his right hip as the source of his pain.  He does occasionally sleep on that side at night and the pain does wake him up at night.  He will take an anti-inflammatory only at bedtime and that helps him sleep.  This is been going on for about 2 years now.  He has been treated for lymphoma.  He is doing well from the lymphoma standpoint of things and that is been watched closely.  He is not obese.  He is not a diabetic.  He denies any injuries to that hip and has not had any surgery either.  Examination of his right hip shows it moves smoothly and fluidly with no blocks to rotation and no pain in the groin at all.  When I have him lay on his side with his right hip up he does have pain over the tip of the trochanteric area that hip.  An AP pelvis and lateral the right hip shows normal-appearing hips bilaterally.  The joint spaces well-maintained.  There are no cortical irregularities around the right trochanteric area.  I have shown him stretching exercises to try.  Right now my working diagnosis is likely trochanteric bursitis with gluteus medius and minimus tendinitis.  I recommended a steroid injection around the tip of the trochanteric area and he agreed to this and tolerated it well.  If this does not get better my neck step would be a repeat injection as well as formal physical therapy.  All questions and concerns were answered and addressed.  Follow-up can be as needed.    Procedure Note  Patient: Brian Kaiser             Date of Birth: 1955/11/29           MRN: [id number]             Visit Date: 05/18/2023  Procedures: Visit Diagnoses:  1. Pain of right hip   2. Trochanteric bursitis, right hip     Large Joint Inj: R greater trochanter on 05/18/2023 5:01 PM Indications: pain and diagnostic evaluation Details:  22 G 1.5 in needle, lateral approach  Arthrogram: No  Medications: 3 mL lidocaine 1 %; 40 mg methylPREDNISolone acetate 40 MG/ML Outcome: tolerated well, no immediate complications Procedure, treatment alternatives, risks and benefits explained, specific risks discussed. Consent was given by the patient. Immediately prior to procedure a time out was called to verify the correct patient, procedure, equipment, support staff and site/side marked as required. Patient was prepped and draped in the usual sterile fashion.

## 2023-05-27 ENCOUNTER — Ambulatory Visit: Payer: Self-pay | Admitting: Surgery

## 2023-05-27 DIAGNOSIS — Z01818 Encounter for other preprocedural examination: Secondary | ICD-10-CM

## 2023-06-24 ENCOUNTER — Encounter (HOSPITAL_COMMUNITY): Payer: Self-pay | Admitting: Surgery

## 2023-06-24 ENCOUNTER — Other Ambulatory Visit: Payer: Self-pay

## 2023-06-24 NOTE — Progress Notes (Signed)
PCP - Cordelia Poche Gastroenterologist  Lynann Bologna  PPM/ICD - denies Device Orders - n/a Rep Notified - n/a  Chest x-ray - denies EKG - 5 plus years ( reports everything fine it was acid reflux) Stress Test - denies ECHO - 5 plus years ago ( reports everything fine it was acid reflux) Cardiac Cath -   CPAP - deneis  DM -denies  Blood Thinner Instructions: denies Aspirin Instructions: n/a  ERAS Protcol - NPO  COVID TEST- n/a  Anesthesia review: no  Patient verbally denies any shortness of breath, fever, cough and chest pain during phone call   -------------  SDW INSTRUCTIONS given:  Your procedure is scheduled on June 28, 2023.  Report to [facility] Main Entrance "A" at 10:30 A.M., and check in at the Admitting office.  Call this number if you have problems the morning of surgery:  [phone]   Remember:  Do not eat  or drink after midnight the night before your surgery     Take these medicines the morning of surgery with A SIP OF WATER  levothyroxine (SYNTHROID)   IF NEEDED acetaminophen (TYLENOL)  pantoprazole (PROTONIX)  As of today, STOP taking any Aspirin (unless otherwise instructed by your surgeon) Aleve, Naproxen, Ibuprofen, Motrin, Advil, Goody's, BC's, all herbal medications, fish oil, and all vitamins.                      Do not wear jewelry, make up, or nail polish            Do not wear lotions, powders, perfumes/colognes, or deodorant.            Do not shave 48 hours prior to surgery.  Men may shave face and neck.            Do not bring valuables to the hospital.            [facility] is not responsible for any belongings or valuables.  Do NOT Smoke (Tobacco/Vaping) 24 hours prior to your procedure If you use a CPAP at night, you may bring all equipment for your overnight stay.   Contacts, glasses, dentures or bridgework may not be worn into surgery.      For patients admitted to the hospital, discharge time will be determined  by your treatment team.   Patients discharged the day of surgery will not be allowed to drive home, and someone needs to stay with them for 24 hours.    Special instructions:   [facility]- Preparing For Surgery  Before surgery, you can play an important role. Because skin is not sterile, your skin needs to be as free of germs as possible. You can reduce the number of germs on your skin by washing with CHG (chlorahexidine gluconate) Soap before surgery.  CHG is an antiseptic cleaner which kills germs and bonds with the skin to continue killing germs even after washing.    Oral Hygiene is also important to reduce your risk of infection.  Remember - BRUSH YOUR TEETH THE MORNING OF SURGERY WITH YOUR REGULAR TOOTHPASTE  Please do not use if you have an allergy to CHG or antibacterial soaps. If your skin becomes reddened/irritated stop using the CHG.  Do not shave (including legs and underarms) for at least 48 hours prior to first CHG shower. It is OK to shave your face.  Please follow these instructions carefully.   Shower the NIGHT BEFORE SURGERY and the MORNING OF SURGERY with  DIAL Soap.   Pat yourself dry with a CLEAN TOWEL.  Wear CLEAN PAJAMAS to bed the night before surgery  Place CLEAN SHEETS on your bed the night of your first shower and DO NOT SLEEP WITH PETS.   Day of Surgery: Please shower morning of surgery  Wear Clean/Comfortable clothing the morning of surgery Do not apply any deodorants/lotions.   Remember to brush your teeth WITH YOUR REGULAR TOOTHPASTE.   Questions were answered. Patient verbalized understanding of instructions.

## 2023-06-28 ENCOUNTER — Other Ambulatory Visit (HOSPITAL_COMMUNITY): Payer: Self-pay

## 2023-06-28 ENCOUNTER — Ambulatory Visit (HOSPITAL_COMMUNITY)
Admission: RE | Admit: 2023-06-28 | Discharge: 2023-06-28 | Disposition: A | Discharge status: Home / Self Care | Payer: Managed Care, Other (non HMO) | Attending: Surgery | Admitting: Surgery

## 2023-06-28 ENCOUNTER — Encounter (HOSPITAL_COMMUNITY)
Admission: RE | Disposition: A | Discharge status: Home / Self Care | Payer: Self-pay | Source: Home / Self Care | Attending: Surgery

## 2023-06-28 ENCOUNTER — Ambulatory Visit (HOSPITAL_BASED_OUTPATIENT_CLINIC_OR_DEPARTMENT_OTHER): Payer: Managed Care, Other (non HMO) | Admitting: Anesthesiology

## 2023-06-28 ENCOUNTER — Ambulatory Visit (HOSPITAL_COMMUNITY): Payer: Managed Care, Other (non HMO) | Admitting: Anesthesiology

## 2023-06-28 DIAGNOSIS — C831A Mantle cell lymphoma, in remission: Secondary | ICD-10-CM | POA: Diagnosis not present

## 2023-06-28 DIAGNOSIS — A630 Anogenital (venereal) warts: Secondary | ICD-10-CM | POA: Diagnosis not present

## 2023-06-28 DIAGNOSIS — K648 Other hemorrhoids: Secondary | ICD-10-CM | POA: Insufficient documentation

## 2023-06-28 DIAGNOSIS — K6282 Dysplasia of anus: Secondary | ICD-10-CM | POA: Diagnosis present

## 2023-06-28 DIAGNOSIS — Z01818 Encounter for other preprocedural examination: Secondary | ICD-10-CM

## 2023-06-28 DIAGNOSIS — K6289 Other specified diseases of anus and rectum: Secondary | ICD-10-CM | POA: Diagnosis not present

## 2023-06-28 HISTORY — PX: MASS EXCISION: SHX2000

## 2023-06-28 HISTORY — PX: RECTAL EXAM UNDER ANESTHESIA: SHX6399

## 2023-06-28 HISTORY — DX: Hypothyroidism, unspecified: E03.9

## 2023-06-28 LAB — BASIC METABOLIC PANEL
Anion gap: 12 (ref 5–15)
BUN: 16 mg/dL (ref 8–23)
CO2: 20 mmol/L — ABNORMAL LOW (ref 22–32)
Calcium: 8.9 mg/dL (ref 8.9–10.3)
Chloride: 108 mmol/L (ref 98–111)
Creatinine, Ser: 1.1 mg/dL (ref 0.61–1.24)
GFR, Estimated: >60 mL/min (ref >60)
Glucose, Bld: 101 mg/dL — ABNORMAL HIGH (ref 70–99)
Potassium: 4.2 mmol/L (ref 3.5–5.1)
Sodium: 140 mmol/L (ref 135–145)

## 2023-06-28 LAB — CBC WITH DIFFERENTIAL/PLATELET
Abs Immature Granulocytes: 0.01 10*3/uL (ref 0.00–0.07)
Basophils Absolute: 0.1 10*3/uL (ref 0.0–0.1)
Basophils Relative: 1 %
Eosinophils Absolute: 0 10*3/uL (ref 0.0–0.5)
Eosinophils Relative: 1 %
HCT: 45.5 % (ref 39.0–52.0)
Hemoglobin: 15.6 g/dL (ref 13.0–17.0)
Immature Granulocytes: 0 %
Lymphocytes Relative: 38 %
Lymphs Abs: 2.2 10*3/uL (ref 0.7–4.0)
MCH: 31.5 pg (ref 26.0–34.0)
MCHC: 34.3 g/dL (ref 30.0–36.0)
MCV: 91.7 fL (ref 80.0–100.0)
Monocytes Absolute: 0.4 10*3/uL (ref 0.1–1.0)
Monocytes Relative: 7 %
Neutro Abs: 3.1 10*3/uL (ref 1.7–7.7)
Neutrophils Relative %: 53 %
Platelets: 185 10*3/uL (ref 150–400)
RBC: 4.96 MIL/uL (ref 4.22–5.81)
RDW: 12.3 % (ref 11.5–15.5)
WBC: 5.7 10*3/uL (ref 4.0–10.5)
nRBC: 0 % (ref 0.0–0.2)

## 2023-06-28 SURGERY — EXCISION MASS
Anesthesia: General

## 2023-06-28 MED ORDER — FENTANYL CITRATE (PF) 100 MCG/2ML IJ SOLN: 25.0000 ug | INTRAMUSCULAR | Status: DC | PRN

## 2023-06-28 MED ORDER — OXYCODONE HCL 5 MG/5ML PO SOLN: 5.0000 mg | Freq: Once | ORAL | Status: DC | PRN

## 2023-06-28 MED ORDER — ORAL CARE MOUTH RINSE: 15.0000 mL | Freq: Once | OROMUCOSAL | Status: AC

## 2023-06-28 MED ORDER — ONDANSETRON HCL 4 MG/2ML IJ SOLN
INTRAMUSCULAR | Status: AC
  Filled 2023-06-28: qty 2

## 2023-06-28 MED ORDER — FLEET ENEMA RE ENEM
1.0000 | ENEMA | Freq: Once | RECTAL | Status: DC
  Filled 2023-06-28: qty 1

## 2023-06-28 MED ORDER — LIDOCAINE HCL 1 % IJ SOLN
INTRAMUSCULAR | Status: AC
  Filled 2023-06-28: qty 20

## 2023-06-28 MED ORDER — MIDAZOLAM HCL 2 MG/2ML IJ SOLN
INTRAMUSCULAR | Status: AC
  Filled 2023-06-28: qty 2

## 2023-06-28 MED ORDER — DEXAMETHASONE SODIUM PHOSPHATE 10 MG/ML IJ SOLN
INTRAMUSCULAR | Status: AC
  Filled 2023-06-28: qty 1

## 2023-06-28 MED ORDER — EPHEDRINE 5 MG/ML INJ
INTRAVENOUS | Status: AC
  Filled 2023-06-28: qty 5

## 2023-06-28 MED ORDER — TRAMADOL HCL 50 MG PO TABS
50.0000 mg | ORAL_TABLET | Freq: Four times a day (QID) | ORAL | 0 refills | Status: AC | PRN
Start: 2023-06-28 — End: 2023-07-03
  Filled 2023-06-28: qty 10, 5d supply, fill #0
  Filled 2023-06-28: qty 10, 3d supply, fill #0

## 2023-06-28 MED ORDER — CHLORHEXIDINE GLUCONATE CLOTH 2 % EX PADS
6.0000 | MEDICATED_PAD | Freq: Once | CUTANEOUS | Status: AC
  Administered 2023-06-28: 6 via TOPICAL

## 2023-06-28 MED ORDER — FENTANYL CITRATE (PF) 250 MCG/5ML IJ SOLN
INTRAMUSCULAR | Status: AC
  Filled 2023-06-28: qty 5

## 2023-06-28 MED ORDER — DROPERIDOL 2.5 MG/ML IJ SOLN: 0.6250 mg | Freq: Once | INTRAMUSCULAR | Status: DC | PRN

## 2023-06-28 MED ORDER — LIDOCAINE 2% (20 MG/ML) 5 ML SYRINGE
INTRAMUSCULAR | Status: AC
  Filled 2023-06-28: qty 5

## 2023-06-28 MED ORDER — LIDOCAINE 2% (20 MG/ML) 5 ML SYRINGE
INTRAMUSCULAR | Status: DC | PRN
  Administered 2023-06-28: 80 mg via INTRAVENOUS

## 2023-06-28 MED ORDER — ONDANSETRON HCL 4 MG/2ML IJ SOLN
INTRAMUSCULAR | Status: DC | PRN
  Administered 2023-06-28: 4 mg via INTRAVENOUS

## 2023-06-28 MED ORDER — ROCURONIUM BROMIDE 10 MG/ML (PF) SYRINGE
PREFILLED_SYRINGE | INTRAVENOUS | Status: AC
  Filled 2023-06-28: qty 10

## 2023-06-28 MED ORDER — PROPOFOL 10 MG/ML IV BOLUS
INTRAVENOUS | Status: AC
  Filled 2023-06-28: qty 20

## 2023-06-28 MED ORDER — PHENYLEPHRINE 80 MCG/ML (10ML) SYRINGE FOR IV PUSH (FOR BLOOD PRESSURE SUPPORT)
PREFILLED_SYRINGE | INTRAVENOUS | Status: AC
  Filled 2023-06-28: qty 10

## 2023-06-28 MED ORDER — MIDAZOLAM HCL 2 MG/2ML IJ SOLN
INTRAMUSCULAR | Status: DC | PRN
  Administered 2023-06-28: 2 mg via INTRAVENOUS

## 2023-06-28 MED ORDER — SUGAMMADEX SODIUM 200 MG/2ML IV SOLN
INTRAVENOUS | Status: DC | PRN
  Administered 2023-06-28: 200 mg via INTRAVENOUS

## 2023-06-28 MED ORDER — LACTATED RINGERS IV SOLN: INTRAVENOUS | Status: DC

## 2023-06-28 MED ORDER — OXYCODONE HCL 5 MG PO TABS: 5.0000 mg | ORAL_TABLET | Freq: Once | ORAL | Status: DC | PRN

## 2023-06-28 MED ORDER — ACETAMINOPHEN 500 MG PO TABS
1000.0000 mg | ORAL_TABLET | Freq: Once | ORAL | Status: AC
  Administered 2023-06-28: 1000 mg via ORAL
  Filled 2023-06-28: qty 2

## 2023-06-28 MED ORDER — FENTANYL CITRATE (PF) 250 MCG/5ML IJ SOLN
INTRAMUSCULAR | Status: DC | PRN
  Administered 2023-06-28: 100 ug via INTRAVENOUS

## 2023-06-28 MED ORDER — DIBUCAINE (PERIANAL) 1 % EX OINT
TOPICAL_OINTMENT | CUTANEOUS | Status: AC
  Filled 2023-06-28: qty 28

## 2023-06-28 MED ORDER — DEXAMETHASONE SODIUM PHOSPHATE 10 MG/ML IJ SOLN
INTRAMUSCULAR | Status: DC | PRN
  Administered 2023-06-28: 10 mg via INTRAVENOUS

## 2023-06-28 MED ORDER — BUPIVACAINE-EPINEPHRINE (PF) 0.25% -1:200000 IJ SOLN
INTRAMUSCULAR | Status: DC | PRN
  Administered 2023-06-28: 20 mL via SURGICAL_CAVITY

## 2023-06-28 MED ORDER — ACETAMINOPHEN 500 MG PO TABS: 1000.0000 mg | ORAL_TABLET | ORAL | Status: DC

## 2023-06-28 MED ORDER — BUPIVACAINE-EPINEPHRINE (PF) 0.5% -1:200000 IJ SOLN
INTRAMUSCULAR | Status: AC
  Filled 2023-06-28: qty 30

## 2023-06-28 MED ORDER — ROCURONIUM BROMIDE 10 MG/ML (PF) SYRINGE
PREFILLED_SYRINGE | INTRAVENOUS | Status: DC | PRN
  Administered 2023-06-28: 50 mg via INTRAVENOUS

## 2023-06-28 MED ORDER — BUPIVACAINE LIPOSOME 1.3 % IJ SUSP
INTRAMUSCULAR | Status: AC
  Filled 2023-06-28: qty 20

## 2023-06-28 MED ORDER — SODIUM CHLORIDE 0.9 % IV SOLN
2.0000 g | INTRAVENOUS | Status: AC
  Administered 2023-06-28: 2 g via INTRAVENOUS
  Filled 2023-06-28: qty 2

## 2023-06-28 MED ORDER — PHENYLEPHRINE HCL-NACL 20-0.9 MG/250ML-% IV SOLN
INTRAVENOUS | Status: DC | PRN
  Administered 2023-06-28: 70 ug/min via INTRAVENOUS

## 2023-06-28 MED ORDER — CHLORHEXIDINE GLUCONATE 0.12 % MT SOLN
15.0000 mL | Freq: Once | OROMUCOSAL | Status: AC
  Administered 2023-06-28: 15 mL via OROMUCOSAL
  Filled 2023-06-28: qty 15

## 2023-06-28 MED ORDER — CHLORHEXIDINE GLUCONATE CLOTH 2 % EX PADS: 6.0000 | MEDICATED_PAD | Freq: Once | CUTANEOUS | Status: DC

## 2023-06-28 MED ORDER — BUPIVACAINE LIPOSOME 1.3 % IJ SUSP: 20.0000 mL | Freq: Once | INTRAMUSCULAR | Status: DC

## 2023-06-28 MED ORDER — PROPOFOL 10 MG/ML IV BOLUS
INTRAVENOUS | Status: DC | PRN
  Administered 2023-06-28: 160 mg via INTRAVENOUS

## 2023-06-28 SURGICAL SUPPLY — 45 items
ADH SKN CLS APL DERMABOND .7 (GAUZE/BANDAGES/DRESSINGS) ×1
BAG COUNTER SPONGE SURGICOUNT (BAG) ×1 IMPLANT
BAG SPNG CNTER NS LX DISP (BAG) ×1
BLADE SURG 15 STRL LF DISP TIS (BLADE) ×1 IMPLANT
BLADE SURG 15 STRL SS (BLADE) ×1
BRIEF MESH DISP LRG (UNDERPADS AND DIAPERS) ×1 IMPLANT
CANISTER SUCT 3000ML PPV (MISCELLANEOUS) ×1 IMPLANT
COVER SURGICAL LIGHT HANDLE (MISCELLANEOUS) ×1 IMPLANT
DERMABOND ADVANCED .7 DNX12 (GAUZE/BANDAGES/DRESSINGS) ×1 IMPLANT
DISSECTOR SURG LIGASURE 21 (MISCELLANEOUS) IMPLANT
DRAPE LAPAROTOMY 100X72 PEDS (DRAPES) ×1 IMPLANT
ELECT CAUTERY BLADE 6.4 (BLADE) ×1 IMPLANT
ELECT REM PT RETURN 9FT ADLT (ELECTROSURGICAL) ×1
ELECTRODE REM PT RTRN 9FT ADLT (ELECTROSURGICAL) ×1 IMPLANT
GAUZE PAD ABD 8X10 STRL (GAUZE/BANDAGES/DRESSINGS) ×1 IMPLANT
GAUZE SPONGE 4X4 12PLY STRL (GAUZE/BANDAGES/DRESSINGS) ×1 IMPLANT
GLOVE BIO SURGEON STRL SZ7.5 (GLOVE) ×1 IMPLANT
GLOVE INDICATOR 8.0 STRL GRN (GLOVE) ×1 IMPLANT
GOWN STRL REUS W/ TWL LRG LVL3 (GOWN DISPOSABLE) ×1 IMPLANT
GOWN STRL REUS W/ TWL XL LVL3 (GOWN DISPOSABLE) ×1 IMPLANT
GOWN STRL REUS W/TWL LRG LVL3 (GOWN DISPOSABLE) ×1
GOWN STRL REUS W/TWL XL LVL3 (GOWN DISPOSABLE) ×1
KIT BASIN OR (CUSTOM PROCEDURE TRAY) ×1 IMPLANT
KIT TURNOVER KIT B (KITS) ×1 IMPLANT
NDL HYPO 25GX1X1/2 BEV (NEEDLE) ×1 IMPLANT
NEEDLE HYPO 25GX1X1/2 BEV (NEEDLE) ×1
NS IRRIG 1000ML POUR BTL (IV SOLUTION) ×1 IMPLANT
PACK GENERAL/GYN (CUSTOM PROCEDURE TRAY) ×1 IMPLANT
PACK LITHOTOMY IV (CUSTOM PROCEDURE TRAY) ×1 IMPLANT
PAD ARMBOARD 7.5X6 YLW CONV (MISCELLANEOUS) ×1 IMPLANT
PENCIL BUTTON HOLSTER BLD 10FT (ELECTRODE) ×1 IMPLANT
PENCIL SMOKE EVACUATOR (MISCELLANEOUS) ×1 IMPLANT
SPECIMEN JAR MEDIUM (MISCELLANEOUS) ×1 IMPLANT
SPIKE FLUID TRANSFER (MISCELLANEOUS) IMPLANT
SPONGE T-LAP 4X18 ~~LOC~~+RFID (SPONGE) ×1 IMPLANT
SURGILUBE 2OZ TUBE FLIPTOP (MISCELLANEOUS) ×1 IMPLANT
SUT CHROMIC 3 0 SH 27 (SUTURE) ×1 IMPLANT
SUT MNCRL AB 4-0 PS2 18 (SUTURE) ×1 IMPLANT
SUT VIC AB 3-0 SH 27 (SUTURE) ×1
SUT VIC AB 3-0 SH 27XBRD (SUTURE) ×1 IMPLANT
SYR CONTROL 10ML LL (SYRINGE) ×1 IMPLANT
TOWEL GREEN STERILE (TOWEL DISPOSABLE) ×1 IMPLANT
TOWEL GREEN STERILE FF (TOWEL DISPOSABLE) ×1 IMPLANT
TUBE CONNECTING 12X1/4 (SUCTIONS) ×1 IMPLANT
YANKAUER SUCT BULB TIP NO VENT (SUCTIONS) ×1 IMPLANT

## 2023-06-28 NOTE — Anesthesia Procedure Notes (Signed)
Procedure Name: Intubation Date/Time: 06/28/2023 1:36 PM  Performed by: April Holding, CRNAPre-anesthesia Checklist: Patient identified, Emergency Drugs available, Suction available, Patient being monitored and Timeout performed Patient Re-evaluated:Patient Re-evaluated prior to induction Oxygen Delivery Method: Circle system utilized Preoxygenation: Pre-oxygenation with 100% oxygen Induction Type: IV induction Ventilation: Mask ventilation without difficulty Laryngoscope Size: Mac and 4 Grade View: Grade I Tube type: Oral Tube size: 7.5 mm Number of attempts: 1 Airway Equipment and Method: Stylet Placement Confirmation: ETT inserted through vocal cords under direct vision, positive ETCO2, CO2 detector and breath sounds checked- equal and bilateral Secured at: 22 cm Tube secured with: Tape Dental Injury: Teeth and Oropharynx as per pre-operative assessment  Comments: Placed by SRNA under direct supervision of CRNA and MDA

## 2023-06-28 NOTE — Discharge Instructions (Addendum)
ANORECTAL SURGERY: POST OP INSTRUCTIONS  DIET: Follow a light bland diet the first 24 hours after arrival home, such as soup, liquids, crackers, etc.  Be sure to include lots of fluids daily.  Avoid fast food or heavy meals as your are more likely to get nauseated.  Eat a low fat diet the next few days after surgery.   Some bleeding with bowel movements is expected for the first couple of days but this should stop in between bowel movements  Take your usually prescribed home medications unless otherwise directed. No foreign bodies per rectum for the next 3 months (enemas, etc)  PAIN CONTROL: It is helpful to take an over-the-counter pain medication regularly for the first few days/weeks.  Choose from the following that works best for you: Ibuprofen (Advil, etc) Three 200mg tabs every 6 hours as needed. Acetaminophen (Tylenol, etc) 500-650mg every 6 hours as needed NOTE: You may take both of these medications together - most patients find it most helpful when alternating between the two (i.e. Ibuprofen at 6am, tylenol at 9am, ibuprofen at 12pm ...) A  prescription for pain medication may have been prescribed for you at discharge.  Take your pain medication as prescribed.  If you are having problems/concerns with the prescription medicine, please call us for further advice.  Avoid getting constipated.  Between the surgery and the pain medications, it is common to experience some constipation.  Increasing fluid intake (64oz of water per day) and taking a fiber supplement (such as Metamucil, Citrucel, FiberCon) 1-2 times a day regularly will usually help prevent this problem from occurring.  Take Miralax (over the counter) 1-2x/day while taking a narcotic pain medication. If no bowel movement after 48hours, you may additionally take a laxative like a bottle of Milk of Magnesia which can be purchased over the counter. Avoid enemas.   Watch out for diarrhea.  If you have many loose bowel movements,  simplify your diet to bland foods.  Stop any stool softeners and decrease your fiber supplement. If this worsens or does not improve, please call us.  Wash / shower every day.  If you were discharged with a dressing, you may remove this the day after your surgery. You may shower normally, getting soap/water on your wound, particularly after bowel movements.  Soaking in a warm bath filled a couple inches ("Sitz bath") is a great way to clean the area after a bowel movement and many patients find it is a way to soothe the area.  ACTIVITIES as tolerated:   You may resume regular (light) daily activities beginning the next day--such as daily self-care, walking, climbing stairs--gradually increasing activities as tolerated.  If you can walk 30 minutes without difficulty, it is safe to try more intense activity such as jogging, treadmill, bicycling, low-impact aerobics, etc. Refrain from any heavy lifting or straining for the first 2 weeks after your procedure, particularly if your surgery was for hemorrhoids. Avoid activities that make your pain worse You may drive when you are no longer taking prescription pain medication, you can comfortably wear a seatbelt, and you can safely maneuver your car and apply brakes.  FOLLOW UP in our office Please call [facility] at [phone] to set up an appointment to see your surgeon in the office for a follow-up appointment approximately 2 weeks after your surgery. Make sure that you call for this appointment the day you arrive home to insure a convenient appointment time.  9. If you have disability or family leave forms   that need to be completed, you may have them completed by your primary care physician's office; for return to work instructions, please ask our office staff and they will be happy to assist you in obtaining this documentation   When to call us [phone]: Poor pain control Reactions / problems with new medications (rash/itching, etc)  Fever over  101.5 F (38.5 C) Inability to urinate Nausea/vomiting Worsening swelling or bruising Continued bleeding from incision. Increased pain, redness, or drainage from the incision  The clinic staff is available to answer your questions during regular business hours (8:30am-5pm).  Please don't hesitate to call and ask to speak to one of our nurses for clinical concerns.   A surgeon from [facility] is always on call at the hospitals   If you have a medical emergency, go to the nearest emergency room or call 911.   [facility] [address], [address], [place], [place]  [address] MAIN: [phone] FAX: [phone] [email or web address] 

## 2023-06-28 NOTE — H&P (Signed)
CC: Here today for surgery  HPI: Brian Kaiser is an 67 y.o. male with history of stage IV mantle cell lymphoma in remission, hypothyroidism, GERD, condyloma whom is seen in the office today as a referral by Dr. Manus Gunning for evaluation of perianal lesion.   Cscope 2019 Dr. Lavon Paganini -small polyps removed. Internal hemorrhoids.   Follows now with Dr. Chales Abrahams. He reports that he has noticed a tuft of skin externally right at the anal opening. He does report a history of having had a lesion excised from the perianal area approximately a decade ago and was told this was condyloma. He denies any other anorectal surgeries or procedures in the past. We do not have records of this but there is pathologic report showing rectal polyp removal in 2009 with Dr. Dickie La that showed leiomyoma. No adenomatous change or carcinoma.   He denies any changes in health or health history since we met in the office. No new medications/allergies. He states he is ready for surgery today.  Past Medical History:  Diagnosis Date   Allergy    Forearm laceration    right forearm   GERD (gastroesophageal reflux disease)    Hepatitis B    Hyperlipidemia    Hypothyroidism    Pulmonary lesion    Left pulmonary schwannoma   Schwannoma    left lower lung    Past Surgical History:  Procedure Laterality Date   COLONOSCOPY     LIGAMENT REPAIR Right 04/19/2019   Right forearm   LIPOMA EXCISION Left    neck   LUNG SURGERY Left    OTHER SURGICAL HISTORY Left    pulmonary schwannoma excision   UPPER GASTROINTESTINAL ENDOSCOPY     WOUND EXPLORATION Right 04/19/2019   Procedure: WOUND EXPLORATION; REPAIR RIGHT FOREARM LACERATION;  Surgeon: Dairl Ponder, MD;  Location: [facility];  Service: Orthopedics;  Laterality: Right;  AXILLARY BLOCK AND MAC    Family History  Problem Relation Age of Onset   Macular degeneration Mother    Dementia Mother    Diverticulitis Mother    Prostate cancer Paternal  Grandfather    Diverticulitis Brother    Colon cancer Neg Hx    Colon polyps Neg Hx    Esophageal cancer Neg Hx    Liver cancer Neg Hx    Pancreatic cancer Neg Hx    Rectal cancer Neg Hx    Stomach cancer Neg Hx     Social:  reports that he has never smoked. He has never used smokeless tobacco. He reports current alcohol use of about 1.0 standard drink of alcohol per week. He reports that he does not use drugs.  Allergies:  Allergies  Allergen Reactions   Augmentin [Amoxicillin-Pot Clavulanate] Rash    Pt states he had a rash from augmentin 29 years ago.    Medications: I have reviewed the patient's current medications.  No results found for this or any previous visit (from the past 48 hour(s)).  No results found.   PE Height 5\' 9"  (1.753 m), weight 86.2 kg. Constitutional: NAD; conversant Eyes: Moist conjunctiva; no lid lag; anicteric Lungs: Normal respiratory effort CV: RRR Psychiatric: Appropriate affect  (From OV 05/27/23 -  Anorectal: Left posterior at the anal os there is verrucous-like changes in the left posterior position. Suspicious in appearance for anal lesion/condyloma. Otherwise, 2 perianal comedones and normal skin. DRE-normal tone/squeeze, no palpable masses or abnormalities. Anoscopy: Circumferential anoscopy demonstrates healthy appearing anoderm without any evident fissures or significant hemorrhoidal components.  No obvious lesions within the anal canal )  No results found for this or any previous visit (from the past 48 hour(s)).  No results found.  A/P: Brian Kaiser is an 66 y.o. male with hx of stage IV mantle cell lymphoma in remission, hypothyroidism, GERD here for surgery regarding perianal lesion  -The anatomy and physiology of the anal canal was discussed with the patient with associated pictures. The pathophysiology of anal lesions was discussed at length as well -We have reviewed options going forward including further observation vs surgery  -excision of perianal lesion; anorectal exam under anesthesia.  We discussed that with observation, and thus not knowing exactly what this represents, it could grow or progress over time. Particularly in the setting of reported history of perianal condyloma. -The planned procedure, material risks (including, but not limited to, pain, bleeding, infection, scarring, need for blood transfusion, rare cases of pelvic sepsis/infection, recurrence, pneumonia, heart attack, stroke, death) benefits and alternatives to surgery were discussed at length. The patient's questions were answered to his satisfaction, he voiced understanding and elected to proceed with surgery. Additionally, we discussed typical postoperative expectations and the recovery process.   Marin Olp, MD [facility], A [facility]

## 2023-06-28 NOTE — Anesthesia Postprocedure Evaluation (Signed)
Anesthesia Post Note  Patient: Brian Kaiser  Procedure(s) Performed: EXCISION PERIANAL LESION RECTAL EXAM UNDER ANESTHESIA     Patient location during evaluation: PACU Anesthesia Type: General Level of consciousness: awake and alert Pain management: pain level controlled Vital Signs Assessment: post-procedure vital signs reviewed and stable Respiratory status: spontaneous breathing, nonlabored ventilation and respiratory function stable Cardiovascular status: blood pressure returned to baseline Postop Assessment: no apparent nausea or vomiting Anesthetic complications: no   No notable events documented.  Last Vitals:  Vitals:   06/28/23 1415 06/28/23 1430  BP: 134/88 (!) 176/92  Pulse: 69   Resp: 12   Temp:    SpO2: 97%     Last Pain:  Vitals:   06/28/23 1430  TempSrc:   PainSc: 0-No pain                 Shanda Howells

## 2023-06-28 NOTE — Op Note (Signed)
06/28/2023  2:05 PM  PATIENT:  Brian Kaiser  67 y.o. male  Patient Care Team: Blair Heys, MD as PCP - General (Family Medicine) [name], MD as Consulting Physician (Hematology)  PRE-OPERATIVE DIAGNOSIS: Perianal skin lesion  POST-OPERATIVE DIAGNOSIS: Same  PROCEDURE:   1.  Excision of left posterior perianal lesion 2.  Anorectal exam under anesthesia   SURGEON:  Surgeon(s): Andria Meuse, MD  ASSISTANT: OR Staff   ANESTHESIA:   local and general  SPECIMEN:  Left posterior perianal lesion  DISPOSITION OF SPECIMEN:  PATHOLOGY  COUNTS:  Sponge, needle, and instrument counts were reported correct x2 at conclusion.  EBL: 5 mL  PLAN OF CARE: Discharge to home after PACU  PATIENT DISPOSITION:  PACU - hemodynamically stable.  OR FINDINGS: Perianal skin notable for a single lesion in the left posterior position at the anal os with slight verrucous like changes which may represent condyloma.  This was fully excised for pathologic purposes.  Circumferential anoscopy under anesthesia demonstrates no other evident lesions or concerning findings in the anal canal aside from low-grade internal hemorrhoids.  DESCRIPTION: The patient was identified in the preoperative holding area and taken to the OR where he was placed on the operating room table. SCDs were placed.  General anesthesia was induced without difficulty. The patient was then positioned in high lithotomy with Allen stirrups. Pressure points were then evaluated and padded.  He was then prepped and draped in usual sterile fashion.  A surgical timeout was performed indicating the correct patient, procedure, and positioning.  A perianal block was performed using a dilute mixture of 0.25% Marcaine with epinephrine and Exparel.   After ascertaining that an appropriate level of anesthesia had been achieved, a well lubricated digital rectal exam was performed. This demonstrated no palpable masses.  Externally,  there is a notable raised tag with verrucous like changes which may represent condylomatous like change.  This correlated with what was seen in the office.  There are no other suspicious perianal lesions.  He does have a few perianal comedones which are confirmed by gentle expression..  A Hill-Ferguson anoscope was into the anal canal and circumferential inspection demonstrated healthy appearing anoderm.  Small/low-grade internal hemorrhoids.  No suspicious appearing anal canal or distal rectal lesions.    Attention is directed at the left posterior perianal lesion which is right at the anal os.  This is elevated with an Allis forcep.  Margins are marked with cautery.  This is then excised sharply.  This was passed off as specimen.  Hemostasis is achieved with electrocautery.  The area was irrigated and hemostasis is verified.  This area is then reapproximated using interrupted 3-0 chromic sutures.  All sponge, needle, and instrument counts were reported correct.  Additional local anesthetic was infiltrated around the excision site.  A dressing consisting of 4 x 4's and mesh underwear was then placed.  He was taken out of lithotomy position and awakened from anesthesia, extubated, and transferred to a stretcher for transport to recovery in satisfactory condition.  DISPOSITION: PACU in satisfactory condition.

## 2023-06-28 NOTE — Transfer of Care (Signed)
Immediate Anesthesia Transfer of Care Note  Patient: Brian Kaiser  Procedure(s) Performed: EXCISION PERIANAL LESION RECTAL EXAM UNDER ANESTHESIA  Patient Location: PACU   Anesthesia Type:General  Level of Consciousness: awake, alert , and oriented  Airway & Oxygen Therapy: Patient Spontanous Breathing  Post-op Assessment: Report given to RN, Post -op Vital signs reviewed and stable, and Patient moving all extremities X 4  Post vital signs: Reviewed and stable  Last Vitals:  Vitals Value Taken Time  BP 132/82 06/28/23 1408  Temp    Pulse 66 06/28/23 1410  Resp 16 06/28/23 1410  SpO2 97 % 06/28/23 1410  Vitals shown include unfiled device data.  Last Pain:  Vitals:   06/28/23 1103  TempSrc:   PainSc: 0-No pain         Complications: No notable events documented.

## 2023-06-28 NOTE — Anesthesia Preprocedure Evaluation (Addendum)
Anesthesia Evaluation  Patient identified by MRN, date of birth, ID band Patient awake    Reviewed: Allergy & Precautions, NPO status , Patient's Chart, lab work & pertinent test results  History of Anesthesia Complications Negative for: history of anesthetic complications  Airway Mallampati: II  TM Distance: >3 FB Neck ROM: Full    Dental no notable dental hx.    Pulmonary neg pulmonary ROS   Pulmonary exam normal        Cardiovascular negative cardio ROS Normal cardiovascular exam     Neuro/Psych negative neurological ROS     GI/Hepatic ,GERD  Medicated and Controlled,,(+) Hepatitis -, B  Endo/Other  Hypothyroidism    Renal/GU negative Renal ROS  negative genitourinary   Musculoskeletal negative musculoskeletal ROS (+)    Abdominal   Peds  Hematology Mantle cell lymphoma   Anesthesia Other Findings PERIANAL LESION  Reproductive/Obstetrics negative OB ROS                             Anesthesia Physical Anesthesia Plan  ASA: 2  Anesthesia Plan: General   Post-op Pain Management: Tylenol PO (pre-op)*   Induction: Intravenous  PONV Risk Score and Plan: 2 and Treatment may vary due to age or medical condition, Ondansetron, Dexamethasone and Midazolam  Airway Management Planned: Oral ETT  Additional Equipment: None  Intra-op Plan:   Post-operative Plan: Extubation in OR  Informed Consent: I have reviewed the patients History and Physical, chart, labs and discussed the procedure including the risks, benefits and alternatives for the proposed anesthesia with the patient or authorized representative who has indicated his/her understanding and acceptance.     Dental advisory given  Plan Discussed with: CRNA  Anesthesia Plan Comments:         Anesthesia Quick Evaluation

## 2023-06-29 ENCOUNTER — Encounter (HOSPITAL_COMMUNITY): Payer: Self-pay | Admitting: Surgery

## 2023-06-30 LAB — SURGICAL PATHOLOGY

## 2023-07-09 ENCOUNTER — Other Ambulatory Visit (HOSPITAL_COMMUNITY): Payer: Self-pay

## 2023-07-09 ENCOUNTER — Encounter: Payer: Self-pay | Admitting: Hematology

## 2023-07-26 ENCOUNTER — Other Ambulatory Visit: Payer: Self-pay

## 2023-07-26 DIAGNOSIS — C8318 Mantle cell lymphoma, lymph nodes of multiple sites: Secondary | ICD-10-CM

## 2023-07-27 ENCOUNTER — Encounter: Payer: Self-pay | Admitting: Hematology

## 2023-07-27 ENCOUNTER — Other Ambulatory Visit: Payer: Self-pay

## 2023-07-27 ENCOUNTER — Other Ambulatory Visit (HOSPITAL_COMMUNITY): Payer: Self-pay

## 2023-07-27 ENCOUNTER — Inpatient Hospital Stay (HOSPITAL_BASED_OUTPATIENT_CLINIC_OR_DEPARTMENT_OTHER): Payer: Managed Care, Other (non HMO) | Admitting: Hematology

## 2023-07-27 ENCOUNTER — Inpatient Hospital Stay: Payer: Managed Care, Other (non HMO) | Attending: Hematology

## 2023-07-27 VITALS — BP 153/92 | HR 63 | Temp 97.5°F | Resp 20 | Wt 191.4 lb

## 2023-07-27 DIAGNOSIS — C8318 Mantle cell lymphoma, lymph nodes of multiple sites: Secondary | ICD-10-CM

## 2023-07-27 DIAGNOSIS — D709 Neutropenia, unspecified: Secondary | ICD-10-CM | POA: Diagnosis not present

## 2023-07-27 DIAGNOSIS — C8314 Mantle cell lymphoma, lymph nodes of axilla and upper limb: Secondary | ICD-10-CM | POA: Diagnosis present

## 2023-07-27 LAB — CBC WITH DIFFERENTIAL (CANCER CENTER ONLY)
Abs Immature Granulocytes: 0.01 10*3/uL (ref 0.00–0.07)
Basophils Absolute: 0.1 10*3/uL (ref 0.0–0.1)
Basophils Relative: 1 %
Eosinophils Absolute: 0.1 10*3/uL (ref 0.0–0.5)
Eosinophils Relative: 1 %
HCT: 42 % (ref 39.0–52.0)
Hemoglobin: 14.7 g/dL (ref 13.0–17.0)
Immature Granulocytes: 0 %
Lymphocytes Relative: 27 %
Lymphs Abs: 1.7 10*3/uL (ref 0.7–4.0)
MCH: 32 pg (ref 26.0–34.0)
MCHC: 35 g/dL (ref 30.0–36.0)
MCV: 91.3 fL (ref 80.0–100.0)
Monocytes Absolute: 0.5 10*3/uL (ref 0.1–1.0)
Monocytes Relative: 8 %
Neutro Abs: 4.1 10*3/uL (ref 1.7–7.7)
Neutrophils Relative %: 63 %
Platelet Count: 215 10*3/uL (ref 150–400)
RBC: 4.6 MIL/uL (ref 4.22–5.81)
RDW: 12.3 % (ref 11.5–15.5)
WBC Count: 6.5 10*3/uL (ref 4.0–10.5)
nRBC: 0 % (ref 0.0–0.2)

## 2023-07-27 LAB — CMP (CANCER CENTER ONLY)
ALT: 34 U/L (ref 0–44)
AST: 26 U/L (ref 15–41)
Albumin: 4.3 g/dL (ref 3.5–5.0)
Alkaline Phosphatase: 67 U/L (ref 38–126)
Anion gap: 4 — ABNORMAL LOW (ref 5–15)
BUN: 18 mg/dL (ref 8–23)
CO2: 31 mmol/L (ref 22–32)
Calcium: 9.4 mg/dL (ref 8.9–10.3)
Chloride: 106 mmol/L (ref 98–111)
Creatinine: 1.15 mg/dL (ref 0.61–1.24)
GFR, Estimated: >60 mL/min (ref >60)
Glucose, Bld: 88 mg/dL (ref 70–99)
Potassium: 4.3 mmol/L (ref 3.5–5.1)
Sodium: 141 mmol/L (ref 135–145)
Total Bilirubin: 0.5 mg/dL (ref <1.2)
Total Protein: 6.5 g/dL (ref 6.5–8.1)

## 2023-07-27 LAB — LACTATE DEHYDROGENASE: LDH: 157 U/L (ref 98–192)

## 2023-07-27 NOTE — Progress Notes (Signed)
HEMATOLOGY/ONCOLOGY CLINIC VISIT NOTE  Date of Service: 07/27/2023  Patient Care Team: Blair Heys, MD (Inactive) as PCP - General (Family Medicine) [name], MD as Consulting Physician (Hematology)  CHIEF COMPLAINTS/PURPOSE OF CONSULTATION:  Follow-up for continued evaluation and management of mantle cell lymphoma  HISTORY OF PRESENTING ILLNESS:  Please see previous note for details on initial presentation.  INTERVAL HISTORY:  Brian Kaiser is a 67 y.o. male here today for continued evaluation and management of mantle cell lymphoma.   Patient was last seen by me on 02/05/2023 and he complained of intermittent bilateral hand, bilateral leg swelling, and mild hip pain.   Patient is accompanied by his wife during this visit. Patient notes he has been doing well overall since our last visit. He complains of consistent left groin/testicular pain/discomfort and complains of occasional frequent urination. He denies any swelling, redness, or lumps/bumps near his testicular area. Patient has not been to his PCP regarding this discomfort. He currently does not have a Insurance underwriter. Patient notes his testicular discomfort/pain improves with movement. During this visit, the patient notes his discomfort is better.   Patient notes he had rectal tag removed since our last visit. He denies any abnormal bowel movement.   Patient notes he has had 5 episodes of night sweats, which he attributes it to hot weather. He has not had any episodes of night sweats for the past 1-2 months.   He denies any new infection issues, fever, chills, night sweats, unexpected weight loss, abnormal bowel movement, abdominal pain, chest pain, back pain, or leg swelling.   MEDICAL HISTORY:  Past Medical History:  Diagnosis Date   Allergy    Forearm laceration    right forearm   GERD (gastroesophageal reflux disease)    Hepatitis B    Hyperlipidemia    Hypothyroidism    Pulmonary lesion    Left  pulmonary schwannoma   Schwannoma    left lower lung    SURGICAL HISTORY: Past Surgical History:  Procedure Laterality Date   COLONOSCOPY     LIGAMENT REPAIR Right 04/19/2019   Right forearm   LIPOMA EXCISION Left    neck   LUNG SURGERY Left    MASS EXCISION N/A 06/28/2023   Procedure: EXCISION PERIANAL LESION;  Surgeon: Andria Meuse, MD;  Location: [facility] OR;  Service: General;  Laterality: N/A;   OTHER SURGICAL HISTORY Left    pulmonary schwannoma excision   RECTAL EXAM UNDER ANESTHESIA N/A 06/28/2023   Procedure: RECTAL EXAM UNDER ANESTHESIA;  Surgeon: Andria Meuse, MD;  Location: [facility] OR;  Service: General;  Laterality: N/A;   UPPER GASTROINTESTINAL ENDOSCOPY     WOUND EXPLORATION Right 04/19/2019   Procedure: WOUND EXPLORATION; REPAIR RIGHT FOREARM LACERATION;  Surgeon: Dairl Ponder, MD;  Location: [facility];  Service: Orthopedics;  Laterality: Right;  AXILLARY BLOCK AND MAC    SOCIAL HISTORY: Social History   Socioeconomic History   Marital status: Married    Spouse name: Not on file   Number of children: Not on file   Years of education: Not on file   Highest education level: Not on file  Occupational History   Not on file  Tobacco Use   Smoking status: Never   Smokeless tobacco: Never  Vaping Use   Vaping status: Never Used  Substance and Sexual Activity   Alcohol use: Yes    Alcohol/week: 1.0 standard drink of alcohol    Types: 1 Glasses of wine per week  Comment: glass of wine once a week   Drug use: No   Sexual activity: Not on file  Other Topics Concern   Not on file  Social History Narrative   Not on file   Social Determinants of Health   Financial Resource Strain: Not on file  Food Insecurity: Not on file  Transportation Needs: Not on file  Physical Activity: Not on file  Stress: Not on file  Social Connections: Not on file  Intimate Partner Violence: Not on file    FAMILY HISTORY: Family History  Problem  Relation Age of Onset   Macular degeneration Mother    Dementia Mother    Diverticulitis Mother    Prostate cancer Paternal Grandfather    Diverticulitis Brother    Colon cancer Neg Hx    Colon polyps Neg Hx    Esophageal cancer Neg Hx    Liver cancer Neg Hx    Pancreatic cancer Neg Hx    Rectal cancer Neg Hx    Stomach cancer Neg Hx     ALLERGIES:  is allergic to augmentin [amoxicillin-pot clavulanate].  MEDICATIONS:  Current Outpatient Medications  Medication Sig Dispense Refill   acetaminophen (TYLENOL) 500 MG tablet Take 500 mg by mouth every 6 (six) hours as needed for moderate pain.     Ascorbic Acid (VITAMIN C) 1000 MG tablet Take 1,000 mg by mouth daily.     cholecalciferol (VITAMIN D3) 25 MCG (1000 UNIT) tablet Take 2,000 Units by mouth daily.     famotidine (PEPCID) 20 MG tablet Take 1 tablet (20 mg total) by mouth at bedtime. (Patient not taking: Reported on 06/17/2023) 90 tablet 4   famotidine (PEPCID) 20 MG tablet Take 1 tablet (20 mg total) by mouth at bedtime. 90 tablet 4   levothyroxine (SYNTHROID) 25 MCG tablet Take 1 tablet (25 mcg total) by mouth in the morning on an empty stomach (Patient not taking: Reported on 06/17/2023) 90 tablet 4   levothyroxine (SYNTHROID) 50 MCG tablet Take 1 tablet (50 mcg total) by mouth every morning on an empty stomach. 90 tablet 3   Magnesium Oxide 420 MG TABS Take 420 mg by mouth daily.     Multiple Vitamins-Minerals (MENS 50+ MULTI VITAMIN/MIN PO) Take 1 tablet by mouth daily.     Omega-3 Fatty Acids (FISH OIL) 1000 MG CAPS Take 1,000 mg by mouth daily.     pantoprazole (PROTONIX) 40 MG tablet Take 1 tablet (40 mg total) by mouth daily. Take 30 minutes before supper (Patient not taking: Reported on 06/17/2023) 90 tablet 4   pantoprazole (PROTONIX) 40 MG tablet Take 1 tablet (40 mg total) by mouth daily. (Patient taking differently: Take 40 mg by mouth daily as needed (acid reflux).) 90 tablet 4   tadalafil (CIALIS) 20 MG tablet Take  0.5-1 tablets (10-20 mg total) by mouth daily as needed. (Patient not taking: Reported on 06/17/2023) 10 tablet 3   Current Facility-Administered Medications  Medication Dose Route Frequency Provider Last Rate Last Admin   0.9 %  sodium chloride infusion   Intravenous PRN Janetta Hora, PA-C        REVIEW OF SYSTEMS:    10 Point review of Systems was done is negative except as noted above.   PHYSICAL EXAMINATION: .BP (!) 153/92   Pulse 63   Temp (!) 97.5 F (36.4 C)   Resp 20   Wt 191 lb 6.4 oz (86.8 kg)   SpO2 100%   BMI 28.26 kg/m   GENERAL:alert,  in no acute distress and comfortable SKIN: no acute rashes, no significant lesions EYES: conjunctiva are pink and non-injected, sclera anicteric OROPHARYNX: MMM, no exudates, no oropharyngeal erythema or ulceration NECK: supple, no JVD LYMPH:  no palpable lymphadenopathy in the cervical, axillary or inguinal regions LUNGS: clear to auscultation b/l with normal respiratory effort HEART: regular rate & rhythm ABDOMEN:  normoactive bowel sounds , non tender, not distended. Extremity: no pedal edema PSYCH: alert & oriented x 3 with fluent speech NEURO: no focal motor/sensory deficits   LABORATORY DATA:   .    Latest Ref Rng & Units 07/27/2023    1:13 PM 06/28/2023   11:00 AM 02/05/2023    2:30 PM  CBC  WBC 4.0 - 10.5 K/uL 6.5  5.7  5.8   Hemoglobin 13.0 - 17.0 g/dL 21.3  08.6  57.8   Hematocrit 39.0 - 52.0 % 42.0  45.5  42.5   Platelets 150 - 400 K/uL 215  185  203    ANC 500 .    Latest Ref Rng & Units 07/27/2023    1:13 PM 06/28/2023   11:00 AM 02/05/2023    2:30 PM  CMP  Glucose 70 - 99 mg/dL 88  469  92   BUN 8 - 23 mg/dL 18  16  17    Creatinine 0.61 - 1.24 mg/dL 6.29  5.28  4.13   Sodium 135 - 145 mmol/L 141  140  142   Potassium 3.5 - 5.1 mmol/L 4.3  4.2  4.3   Chloride 98 - 111 mmol/L 106  108  107   CO2 22 - 32 mmol/L 31  20  30    Calcium 8.9 - 10.3 mg/dL 9.4  8.9  9.4   Total Protein 6.5 - 8.1 g/dL 6.5    6.8   Total Bilirubin <1.2 mg/dL 0.5   0.5   Alkaline Phos 38 - 126 U/L 67   76   AST 15 - 41 U/L 26   27   ALT 0 - 44 U/L 34   28    . Lab Results  Component Value Date   LDH 157 07/27/2023    06/20/2020 Right Axillary Surgical Pathology Report [id number]):    06/20/2020 Right Axillary Lymph Node Flow Pathology ([id number]):   06/11/2020 Flow Pathology ([id number]):   RADIOGRAPHIC STUDIES: I have personally reviewed the radiological images as listed and agreed with the findings in the report. No results found.  ASSESSMENT & PLAN:   67 yo with   1) stage IV mantle cell lymphoma with extensive lymphadenopathy and massive splenomegaly . 2) s/p massive splenomegaly likely related to mantle cell lymphoma  3) Thrombocytopenia-mild platelets of 210k likely related to lymphoma. - resolved 4) hepatitis B core antibody positive, surface antigen negative, HBV DNA PCR negative 5) Neutropenia - intermitent fluctuating-- likely from  immunologic changes from Rituxan use. No fevers. Now resolved.  PLAN: -Discussed lab results from today, 07/27/2023, in detail with the patient. CBC is stable. CMP is stable.  LDH WNL -Discussed with the patient that his testicular/groin pain/discomfort is likely due to muscle strain.  -Recommend to follow-up with PCP regarding change in urination. Possibly need referral to Urologist. -Recommend influenza vaccine, COVID-19 Booster, RSV vaccine, and other age related vaccines. -no further maintenance Rituxan at this time. - no lab or clinical symptoms/signs suggestive of lymphoma recurrence/progression at this time. -no indication for rpt G-CSF -patient continues to be in remission at this time -Answered all of patient's questions.  FOLLOW UP: RTC with Dr Candise Che with labs in 6 months   The total time spent in the appointment was 21 minutes* .  All of the patient's questions were answered with apparent satisfaction. The patient knows to  call the clinic with any problems, questions or concerns.   Wyvonnia Lora MD MS AAHIVMS [facility] Hematology/Oncology Physician [facility]  .*Total Encounter Time as defined by the Centers for Medicare and Medicaid Services includes, in addition to the face-to-face time of a patient visit (documented in the note above) non-face-to-face time: obtaining and reviewing outside history, ordering and reviewing medications, tests or procedures, care coordination (communications with other health care professionals or caregivers) and documentation in the medical record.   I,Param Shah,acting as a Neurosurgeon for Wyvonnia Lora, MD.,have documented all relevant documentation on the behalf of Wyvonnia Lora, MD,as directed by  Wyvonnia Lora, MD while in the presence of Wyvonnia Lora, MD.  .I have reviewed the above documentation for accuracy and completeness, and I agree with the above. [name] MD

## 2023-07-28 ENCOUNTER — Telehealth: Payer: Self-pay | Admitting: Hematology

## 2023-07-28 NOTE — Telephone Encounter (Signed)
Patient stated they did not want to schedule for 6 months out due to wanting to rotate with other provider at [facility]; patient is aware of scheduled appointment time/dates for follow up appointment

## 2023-08-01 ENCOUNTER — Encounter: Payer: Self-pay | Admitting: Hematology

## 2023-08-09 ENCOUNTER — Other Ambulatory Visit: Payer: Self-pay | Admitting: Family Medicine

## 2023-08-09 DIAGNOSIS — K4090 Unilateral inguinal hernia, without obstruction or gangrene, not specified as recurrent: Secondary | ICD-10-CM

## 2023-08-11 ENCOUNTER — Ambulatory Visit
Admission: RE | Admit: 2023-08-11 | Discharge: 2023-08-11 | Disposition: A | Discharge status: Home / Self Care | Payer: Managed Care, Other (non HMO) | Source: Ambulatory Visit | Attending: Family Medicine | Admitting: Family Medicine

## 2023-08-11 DIAGNOSIS — K4090 Unilateral inguinal hernia, without obstruction or gangrene, not specified as recurrent: Secondary | ICD-10-CM

## 2023-08-17 ENCOUNTER — Encounter: Payer: Self-pay | Admitting: Hematology

## 2023-08-18 ENCOUNTER — Other Ambulatory Visit: Payer: Self-pay

## 2023-08-18 ENCOUNTER — Encounter: Payer: Self-pay | Admitting: Hematology

## 2023-08-18 ENCOUNTER — Other Ambulatory Visit (HOSPITAL_COMMUNITY): Payer: Self-pay

## 2023-08-31 ENCOUNTER — Other Ambulatory Visit: Payer: Self-pay

## 2023-08-31 ENCOUNTER — Other Ambulatory Visit (HOSPITAL_COMMUNITY): Payer: Self-pay

## 2023-08-31 ENCOUNTER — Encounter: Payer: Self-pay | Admitting: Hematology

## 2023-08-31 MED ORDER — SILDENAFIL CITRATE 20 MG PO TABS
20.0000 mg | ORAL_TABLET | ORAL | 2 refills | Status: AC | PRN
  Filled 2023-08-31: qty 30, 30d supply, fill #0
  Filled 2023-09-16 (×2): qty 30, 30d supply, fill #1
  Filled 2024-01-12: qty 30, 30d supply, fill #2

## 2023-09-09 ENCOUNTER — Encounter: Payer: Self-pay | Admitting: Hematology

## 2023-09-09 ENCOUNTER — Ambulatory Visit: Payer: Self-pay | Admitting: Urology

## 2023-09-10 ENCOUNTER — Ambulatory Visit (INDEPENDENT_AMBULATORY_CARE_PROVIDER_SITE_OTHER): Payer: Managed Care, Other (non HMO)

## 2023-09-10 ENCOUNTER — Ambulatory Visit (INDEPENDENT_AMBULATORY_CARE_PROVIDER_SITE_OTHER): Payer: Managed Care, Other (non HMO) | Admitting: Podiatry

## 2023-09-10 ENCOUNTER — Encounter: Payer: Self-pay | Admitting: Podiatry

## 2023-09-10 ENCOUNTER — Telehealth: Payer: Self-pay

## 2023-09-10 DIAGNOSIS — R600 Localized edema: Secondary | ICD-10-CM | POA: Diagnosis not present

## 2023-09-10 DIAGNOSIS — M778 Other enthesopathies, not elsewhere classified: Secondary | ICD-10-CM

## 2023-09-10 DIAGNOSIS — S93401A Sprain of unspecified ligament of right ankle, initial encounter: Secondary | ICD-10-CM

## 2023-09-10 DIAGNOSIS — S92354A Nondisplaced fracture of fifth metatarsal bone, right foot, initial encounter for closed fracture: Secondary | ICD-10-CM | POA: Diagnosis not present

## 2023-09-10 NOTE — Telephone Encounter (Signed)
Patient has left a message and mychart message asking about boot and brace. Patient would like to know if it is ok for him to wear the brace at night instead of the boot. Please advise.

## 2023-09-10 NOTE — Progress Notes (Unsigned)
Right 5th met base fracture.  Pneumatic camwalker dispensed.  F/u 4 weeks  On Monday the patient was walking and stepped off a curb.  Hands are falling down into the ditch.  Has been weightbearing this whole time.  He has been wearing the right ankle sleeve and icing and elevating.  He states he feels a little bit better.  On x-ray the ankle looks fine but the fifth met base is fractured we will immobilize

## 2023-09-16 ENCOUNTER — Other Ambulatory Visit (HOSPITAL_COMMUNITY): Payer: Self-pay

## 2023-09-16 ENCOUNTER — Ambulatory Visit (INDEPENDENT_AMBULATORY_CARE_PROVIDER_SITE_OTHER): Payer: Medicare Other

## 2023-09-16 ENCOUNTER — Encounter: Payer: Self-pay | Admitting: Hematology

## 2023-09-16 ENCOUNTER — Ambulatory Visit (INDEPENDENT_AMBULATORY_CARE_PROVIDER_SITE_OTHER): Payer: Managed Care, Other (non HMO) | Admitting: Podiatry

## 2023-09-16 DIAGNOSIS — M778 Other enthesopathies, not elsewhere classified: Secondary | ICD-10-CM

## 2023-09-16 DIAGNOSIS — S92354D Nondisplaced fracture of fifth metatarsal bone, right foot, subsequent encounter for fracture with routine healing: Secondary | ICD-10-CM

## 2023-09-16 DIAGNOSIS — M7989 Other specified soft tissue disorders: Secondary | ICD-10-CM | POA: Diagnosis not present

## 2023-09-16 DIAGNOSIS — M79671 Pain in right foot: Secondary | ICD-10-CM | POA: Diagnosis not present

## 2023-09-16 NOTE — Progress Notes (Signed)
Chief Complaint  Patient presents with   Foot Pain    Increased amount of pain that began Monday-Wednesday. Describes pain as burning across top of foot. Has been wearing boot loose the last day or so and that has actually helped some. Still having some swelling and bruising.    HPI: 67 y.o. male presents today after being seen last week and diagnosed with a right fifth metatarsal fracture, noting significant pain over the last couple of days to the fractured foot.  He called the office this morning to make this appointment today.  He has been wearing the pneumatic cam walker but states that it is very uncomfortable.  He feels like it is cutting into his shin and is making it difficult to walk.  He noted that after his visit last week, since he was not having much pain, he proceeded to use his leaf blower outside.  He stated that he felt it was okay since he was not having much pain.  Denies any injury since last visit.  He became very concerned when he saw increasing swelling and bruising spreading throughout his foot and up the leg slightly.  He states that he has been fairly active since last seen.  His wife is with him today.  He has been soaking in warm Epsom salt water which had not been recommended.  He does not want to ice because his toes are always cold.  He noted that since the pain was increasing, the ankle sleeve he already had, that he was wearing at bedtime, he would loosen up.  He stated that if he kept the compression on the area when sleeping, it would be throbbing and wake him up.  Past Medical History:  Diagnosis Date   Allergy    Forearm laceration    right forearm   GERD (gastroesophageal reflux disease)    Hepatitis B    Hyperlipidemia    Hypothyroidism    Pulmonary lesion    Left pulmonary schwannoma   Schwannoma    left lower lung    Past Surgical History:  Procedure Laterality Date   COLONOSCOPY     LIGAMENT REPAIR Right 04/19/2019   Right forearm   LIPOMA  EXCISION Left    neck   LUNG SURGERY Left    MASS EXCISION N/A 06/28/2023   Procedure: EXCISION PERIANAL LESION;  Surgeon: Andria Meuse, MD;  Location: [facility];  Service: General;  Laterality: N/A;   OTHER SURGICAL HISTORY Left    pulmonary schwannoma excision   RECTAL EXAM UNDER ANESTHESIA N/A 06/28/2023   Procedure: RECTAL EXAM UNDER ANESTHESIA;  Surgeon: Andria Meuse, MD;  Location: [facility];  Service: General;  Laterality: N/A;   UPPER GASTROINTESTINAL ENDOSCOPY     WOUND EXPLORATION Right 04/19/2019   Procedure: WOUND EXPLORATION; REPAIR RIGHT FOREARM LACERATION;  Surgeon: Dairl Ponder, MD;  Location: [facility];  Service: Orthopedics;  Laterality: Right;  AXILLARY BLOCK AND MAC    Allergies  Allergen Reactions   Augmentin [Amoxicillin-Pot Clavulanate] Rash    Pt states he had a rash from augmentin 29 years ago.   Physical Exam: There is moderate generalized edema to the entire right foot and ankle area.  There is resolving ecchymosis throughout the forefoot, heel, and lower leg.  It is turning more of a yellow color at this point in time.  There is significant pain near the fifth metatarsal area along the base.  No open lesions are noted.  No fracture  blisters are seen.  Radiographic Exam (due to the increased activity after the fracture diagnosis and the moderate increase in swelling and bruising throughout the foot and lower leg, it was prudent to proceed with rex-ray of the foot and ankle today to assess for any new injury), 3 weightbearing views of the foot and 2 weightbearing views of the ankle on the right side were obtained:  Normal osseous mineralization.  Increase in soft tissue volume throughout the foot secondary to swelling.  The only fracture visualized at this time is still at the fifth metatarsal base.  It does not appear to have worsened in nature/presentation.  Assessment/Plan of Care: 1. Pain in right foot   2. Capsulitis of foot, right    3. Swelling of right foot   4. Closed nondisplaced fracture of fifth metatarsal bone of right foot with routine healing, subsequent encounter     PR PNEUMAT WALKING BOOT PRE CST  Discussed clinical and radiographic findings with patient today.  Informed the patient that even though he had not been having much pain, he still has a fracture on the foot that needs to heal.  He had a lot of questions about how much he needs to pump the pneumatic cam walker.  Informed the patient that it needs to be compressed similar to what an Ace wrap would feel like.  He had questions on what that should feel like, informed the patient that this should not be so tight that it feels like it is cutting off his blood flow but it should feel supportive and actually decrease his pain.  It cannot be so loose that the boot is ineffective and flopping around.  There was evidence that the cam walker/Shorty style will was cutting into his shin.  It was so sore that application of the additional pads was not effective today so we will need to move the patient to a high/tall pneumatic cam walker.  He noted increased comfort with this.  He will wear this at all times but may remove for showering, and for sleeping.  When sleeping he does need to use the supportive ankle wrap but he needs to keep this compressive and not loose otherwise it is completely ineffective and he would need to go back to wearing the tall cam walker for sleeping as well.  Informed the patient the only other option is to place him in a nonweightbearing hard cast which he did not want to proceed with at this time.  Discussed avoidance of heat or hot soaks to the foot.  This will make his swelling and bruising worse.  He needs to elevate the foot above the level of his heart and use the ice packs.  He asked how he can keep his toes warm while trying to ice the foot.  He was informed he can cut out the toe portion of the sock in place over the toes and wrapped  himself in a blanket while putting the ice on the foot.  But he does need to avoid any increased warmth directly to the foot.  Discussed weightbearing restrictions.  He needs to only be on the foot for necessary activities of daily living, but not for doing errands around the house, yard work, grocery shopping.  Other family members need to take care of these tasks until his fracture is healed.  As since the patient has received 2 cam walkers within a week's time, he did sign a ABN for the possibility that his insurance might  not cover the second boot.  He did reiterate that the boot was too uncomfortable to continue wearing and felt more comfortable in the tall cam walker.  Follow-up as scheduled in approximately 3-1/2 to 4 weeks   Sri Clegg DBurna Mortimer, DPM, FACFAS [facility]     [address] [place], [place] [address]                Office [phone]  Fax [phone]

## 2023-09-24 ENCOUNTER — Ambulatory Visit: Payer: Managed Care, Other (non HMO) | Admitting: Podiatry

## 2023-10-06 ENCOUNTER — Ambulatory Visit (INDEPENDENT_AMBULATORY_CARE_PROVIDER_SITE_OTHER): Payer: Managed Care, Other (non HMO)

## 2023-10-06 ENCOUNTER — Ambulatory Visit (INDEPENDENT_AMBULATORY_CARE_PROVIDER_SITE_OTHER): Payer: Medicare Other | Admitting: Podiatry

## 2023-10-06 DIAGNOSIS — M778 Other enthesopathies, not elsewhere classified: Secondary | ICD-10-CM | POA: Diagnosis not present

## 2023-10-06 DIAGNOSIS — S92354D Nondisplaced fracture of fifth metatarsal bone, right foot, subsequent encounter for fracture with routine healing: Secondary | ICD-10-CM

## 2023-10-06 NOTE — Progress Notes (Signed)
Chief Complaint  Patient presents with   Foot Pain    Right 5th met, he is still having some occasional burning, tingling pain. Ok to touch now, been wearing boot as directed.    HPI: 68 y.o. male presents today with his wife for a recheck of his right fifth metatarsal base fracture.  He notes he is having significantly less pain in the area with less swelling.  He has been wearing his cam walker at all times weightbearing.  He has been wearing the splint when sleeping.  This is much more comfortable for him than the cam walker.  Past Medical History:  Diagnosis Date   Allergy    Forearm laceration    right forearm   GERD (gastroesophageal reflux disease)    Hepatitis B    Hyperlipidemia    Hypothyroidism    Pulmonary lesion    Left pulmonary schwannoma   Schwannoma    left lower lung    Past Surgical History:  Procedure Laterality Date   COLONOSCOPY     LIGAMENT REPAIR Right 04/19/2019   Right forearm   LIPOMA EXCISION Left    neck   LUNG SURGERY Left    MASS EXCISION N/A 06/28/2023   Procedure: EXCISION PERIANAL LESION;  Surgeon: Andria Meuse, MD;  [facility];  Service: General;  Laterality: N/A;   OTHER SURGICAL HISTORY Left    pulmonary schwannoma excision   RECTAL EXAM UNDER ANESTHESIA N/A 06/28/2023   Procedure: RECTAL EXAM UNDER ANESTHESIA;  Surgeon: Andria Meuse, MD;  [facility]: [facility];  Service: General;  Laterality: N/A;   UPPER GASTROINTESTINAL ENDOSCOPY     WOUND EXPLORATION Right 04/19/2019   Procedure: WOUND EXPLORATION; REPAIR RIGHT FOREARM LACERATION;  Surgeon: Dairl Ponder, MD;  Location: [facility];  Service: Orthopedics;  Laterality: Right;  AXILLARY BLOCK AND MAC    Allergies  Allergen Reactions   Augmentin [Amoxicillin-Pot Clavulanate] Rash    Pt states he had a rash from augmentin 29 years ago.    Physical Exam: Palpable pedal pulses right foot.  No edema noted to the right lateral ankle or midfoot.  No open  lesions noted.  No ecchymosis seen.  Minimal pain on palpation of the fifth metatarsal base.  Ankle and subtalar joint range of motion was deferred so as not to have the peroneus brevis pull on the fifth metatarsal base.  Light touch sensation is intact.  Radiographic Exam (right foot, 3 weightbearing views, 10/06/2023):  Normal osseous mineralization.  The fracture line at the styloid process of the fifth metatarsal base is barely visible on the DP and medial oblique views.  This is still visible on the lateral view albeit somewhat smaller.  This is showing normal radiographic healing at this time.  There is an inferior calcaneal spur present.  Assessment/Plan of Care: 1. Closed nondisplaced fracture of fifth metatarsal bone of right foot with routine healing, subsequent encounter    Discussed clinical and radiographic findings with patient and his wife today.  Will have the patient continue with his current restrictions and use of cam walker and splint for an additional 1-1/2 to 2 weeks.  Will rex-ray at that time and the expectation would be that he may be able to return to shoes if healing goes as well as it has thus far.    Will then need to reevaluate for possible referral to physical therapy.   Clerance Lav, DPM, FACFAS [facility]  [address] [place], [place] [address]                Office [phone]  Fax [phone]

## 2023-10-08 ENCOUNTER — Other Ambulatory Visit (HOSPITAL_COMMUNITY): Payer: Self-pay

## 2023-10-14 ENCOUNTER — Other Ambulatory Visit (HOSPITAL_BASED_OUTPATIENT_CLINIC_OR_DEPARTMENT_OTHER): Payer: Self-pay | Admitting: Family

## 2023-10-14 DIAGNOSIS — N63 Unspecified lump in unspecified breast: Secondary | ICD-10-CM

## 2023-10-14 DIAGNOSIS — C8310 Mantle cell lymphoma, unspecified site: Secondary | ICD-10-CM

## 2023-10-15 ENCOUNTER — Ambulatory Visit (INDEPENDENT_AMBULATORY_CARE_PROVIDER_SITE_OTHER): Payer: Managed Care, Other (non HMO) | Admitting: Podiatry

## 2023-10-15 ENCOUNTER — Ambulatory Visit (INDEPENDENT_AMBULATORY_CARE_PROVIDER_SITE_OTHER): Payer: Managed Care, Other (non HMO)

## 2023-10-15 DIAGNOSIS — S92354D Nondisplaced fracture of fifth metatarsal bone, right foot, subsequent encounter for fracture with routine healing: Secondary | ICD-10-CM

## 2023-10-15 NOTE — Progress Notes (Unsigned)
Chief Complaint  Patient presents with   Fracture    Right foot fu. Been on the boot all the time except for the shower.  Feels like he is about 80% better in the boot, but still has some pain. Swelling is continuing to decrease.  Takes fish oil Xrays up in [place] not in room   HPI: 68 y.o. male presents today for follow-up of right fifth metatarsal base fracture.  Patient has been wearing his cam walker boot.  He wears the ankle brace when sleeping because the boot was too uncomfortable.  Past Medical History:  Diagnosis Date   Allergy    Forearm laceration    right forearm   GERD (gastroesophageal reflux disease)    Hepatitis B    Hyperlipidemia    Hypothyroidism    Pulmonary lesion    Left pulmonary schwannoma   Schwannoma    left lower lung    Past Surgical History:  Procedure Laterality Date   COLONOSCOPY     LIGAMENT REPAIR Right 04/19/2019   Right forearm   LIPOMA EXCISION Left    neck   LUNG SURGERY Left    MASS EXCISION N/A 06/28/2023   Procedure: EXCISION PERIANAL LESION;  Surgeon: Andria Meuse, MD;  [facility];  Service: General;  Laterality: N/A;   OTHER SURGICAL HISTORY Left    pulmonary schwannoma excision   RECTAL EXAM UNDER ANESTHESIA N/A 06/28/2023   Procedure: RECTAL EXAM UNDER ANESTHESIA;  Surgeon: Andria Meuse, MD;  [facility]: [facility];  Service: General;  Laterality: N/A;   UPPER GASTROINTESTINAL ENDOSCOPY     WOUND EXPLORATION Right 04/19/2019   Procedure: WOUND EXPLORATION; REPAIR RIGHT FOREARM LACERATION;  Surgeon: Dairl Ponder, MD;  Location: [facility];  Service: Orthopedics;  Laterality: Right;  AXILLARY BLOCK AND MAC    Allergies  Allergen Reactions   Augmentin [Amoxicillin-Pot Clavulanate] Rash    Pt states he had a rash from augmentin 29 years ago.    Physical Exam: Palpable pedal pulses noted to the right foot.  No edema is noted to the right fifth metatarsal base area where the fracture is located.   No pain on palpation of the peroneal tendons.  Gentle inversion/eversion of the foot does not produce pain.  Epicritic sensation is intact.  Radiographic Exam (right foot, 3 weightbearing views, 10/15/2023):  Normal osseous mineralization. Joint spaces preserved.  The right fifth metatarsal base fracture cannot be visualized on the dorsal-plantar or medial oblique views.  Portion of the fracture line can still be seen on the lateral view.  The fracture is in anatomic position and shows signs of normal bone healing.  Assessment/Plan of Care: 1. Closed nondisplaced fracture of fifth metatarsal bone of right foot with routine healing, subsequent encounter     Discussed clinical and radiographic findings with patient today.  Informed the patient that his fracture line in the fifth metatarsal cannot be visualized anymore on the DP or medial oblique views of the radiographs.  The inferior portion of the fracture can still be visualized on the lateral view but is in anatomic position and shows signs of bone healing.  Patient informed he can slowly begin weaning back into regular shoe gear as long as he is wearing his ankle brace for additional support.  He can slowly return to normal shoe gear over the next 10 days.  He can resume exercise activities after 10 days.  He needs to avoid any high impact activities for  at least 2-3 more weeks.  If he develops any increasing pain he needs to return to the boot.  As patient progresses back into his regular shoe gear if he notes any continued weakness around the ankle or foot, we can refer him for physical therapy upon his request.  Follow-up as needed   Christhoper Busbee Orland Mustard, DPM, FACFAS [facility]     [address] [place], [place] [address]                Office [phone]  Fax [phone]

## 2023-10-22 ENCOUNTER — Ambulatory Visit (HOSPITAL_COMMUNITY)
Admission: RE | Admit: 2023-10-22 | Discharge: 2023-10-22 | Disposition: A | Discharge status: Home / Self Care | Payer: Managed Care, Other (non HMO) | Source: Ambulatory Visit | Attending: Family | Admitting: Family

## 2023-10-22 ENCOUNTER — Ambulatory Visit (HOSPITAL_COMMUNITY): Payer: Managed Care, Other (non HMO)

## 2023-10-22 DIAGNOSIS — N63 Unspecified lump in unspecified breast: Secondary | ICD-10-CM | POA: Diagnosis present

## 2023-10-22 DIAGNOSIS — C8310 Mantle cell lymphoma, unspecified site: Secondary | ICD-10-CM | POA: Insufficient documentation

## 2023-10-22 MED ORDER — IOHEXOL 350 MG/ML SOLN
50.0000 mL | Freq: Once | INTRAVENOUS | Status: AC | PRN
  Administered 2023-10-22: 50 mL via INTRAVENOUS

## 2023-11-26 ENCOUNTER — Other Ambulatory Visit: Payer: Self-pay

## 2023-11-26 DIAGNOSIS — C8318 Mantle cell lymphoma, lymph nodes of multiple sites: Secondary | ICD-10-CM

## 2023-11-29 ENCOUNTER — Ambulatory Visit: Payer: Managed Care, Other (non HMO) | Admitting: Hematology

## 2023-11-29 ENCOUNTER — Other Ambulatory Visit: Payer: Managed Care, Other (non HMO)

## 2023-11-29 ENCOUNTER — Inpatient Hospital Stay (HOSPITAL_BASED_OUTPATIENT_CLINIC_OR_DEPARTMENT_OTHER): Payer: Managed Care, Other (non HMO) | Admitting: Hematology

## 2023-11-29 ENCOUNTER — Inpatient Hospital Stay: Payer: Managed Care, Other (non HMO) | Attending: Hematology

## 2023-11-29 VITALS — BP 115/77 | HR 62 | Temp 97.7°F | Resp 16 | Ht 69.0 in | Wt 200.8 lb

## 2023-11-29 DIAGNOSIS — C8318 Mantle cell lymphoma, lymph nodes of multiple sites: Secondary | ICD-10-CM | POA: Diagnosis not present

## 2023-11-29 DIAGNOSIS — C8310 Mantle cell lymphoma, unspecified site: Secondary | ICD-10-CM | POA: Diagnosis present

## 2023-11-29 LAB — CMP (CANCER CENTER ONLY)
ALT: 34 U/L (ref 0–44)
AST: 29 U/L (ref 15–41)
Albumin: 4.8 g/dL (ref 3.5–5.0)
Alkaline Phosphatase: 77 U/L (ref 38–126)
Anion gap: 5 (ref 5–15)
BUN: 16 mg/dL (ref 8–23)
CO2: 30 mmol/L (ref 22–32)
Calcium: 9.4 mg/dL (ref 8.9–10.3)
Chloride: 105 mmol/L (ref 98–111)
Creatinine: 1.08 mg/dL (ref 0.61–1.24)
GFR, Estimated: >60 mL/min (ref >60)
Glucose, Bld: 97 mg/dL (ref 70–99)
Potassium: 3.8 mmol/L (ref 3.5–5.1)
Sodium: 140 mmol/L (ref 135–145)
Total Bilirubin: 0.6 mg/dL (ref 0.0–1.2)
Total Protein: 6.9 g/dL (ref 6.5–8.1)

## 2023-11-29 LAB — CBC WITH DIFFERENTIAL (CANCER CENTER ONLY)
Abs Immature Granulocytes: 0.01 10*3/uL (ref 0.00–0.07)
Basophils Absolute: 0.1 10*3/uL (ref 0.0–0.1)
Basophils Relative: 1 %
Eosinophils Absolute: 0 10*3/uL (ref 0.0–0.5)
Eosinophils Relative: 1 %
HCT: 44.2 % (ref 39.0–52.0)
Hemoglobin: 15.6 g/dL (ref 13.0–17.0)
Immature Granulocytes: 0 %
Lymphocytes Relative: 33 %
Lymphs Abs: 2 10*3/uL (ref 0.7–4.0)
MCH: 32.1 pg (ref 26.0–34.0)
MCHC: 35.3 g/dL (ref 30.0–36.0)
MCV: 90.9 fL (ref 80.0–100.0)
Monocytes Absolute: 0.5 10*3/uL (ref 0.1–1.0)
Monocytes Relative: 7 %
Neutro Abs: 3.7 10*3/uL (ref 1.7–7.7)
Neutrophils Relative %: 58 %
Platelet Count: 222 10*3/uL (ref 150–400)
RBC: 4.86 MIL/uL (ref 4.22–5.81)
RDW: 12.5 % (ref 11.5–15.5)
WBC Count: 6.3 10*3/uL (ref 4.0–10.5)
nRBC: 0 % (ref 0.0–0.2)

## 2023-11-29 LAB — LACTATE DEHYDROGENASE: LDH: 182 U/L (ref 98–192)

## 2023-11-29 NOTE — Progress Notes (Signed)
 HEMATOLOGY/ONCOLOGY CLINIC VISIT NOTE  Date of Service: 11/29/2023  Patient Care Team: Blair Heys, MD (Inactive) as PCP - General (Family Medicine) [name], MD as Consulting Physician (Hematology)  CHIEF COMPLAINTS/PURPOSE OF CONSULTATION:  Follow-up for continued evaluation and management of mantle cell lymphoma  HISTORY OF PRESENTING ILLNESS:  Please see previous note for details on initial presentation.  INTERVAL HISTORY:  Brian Kaiser is a 68 y.o. male here today for continued evaluation and management of mantle cell lymphoma.   Patient was last seen by me on 07/26/2023 and he complained of consistent left groin/testicular pain/discomfort, occasional frequent urination, and occasional night sweats due to the weather.   Patient is accompanied by his wife during this visit. Patient notes he has been doing well overall since our last visit. Patient notes he went to [facility] oncology due to new small bump near his right chest/armpit area. He had an CT scan, which did not show any abnormalities.   He denies any new infection issues, fever, chills, night sweat, unexpected weight loss, back pain, chest pain, hematuria, abdominal pain, or leg swelling. He does complain of ear ache, most likely due to ear infection.   MEDICAL HISTORY:  Past Medical History:  Diagnosis Date   Allergy    Forearm laceration    right forearm   GERD (gastroesophageal reflux disease)    Hepatitis B    Hyperlipidemia    Hypothyroidism    Pulmonary lesion    Left pulmonary schwannoma   Schwannoma    left lower lung    SURGICAL HISTORY: Past Surgical History:  Procedure Laterality Date   COLONOSCOPY     LIGAMENT REPAIR Right 04/19/2019   Right forearm   LIPOMA EXCISION Left    neck   LUNG SURGERY Left    MASS EXCISION N/A 06/28/2023   Procedure: EXCISION PERIANAL LESION;  Surgeon: Andria Meuse, MD;  [facility]: [facility];  Service: General;  Laterality: N/A;   OTHER SURGICAL  HISTORY Left    pulmonary schwannoma excision   RECTAL EXAM UNDER ANESTHESIA N/A 06/28/2023   Procedure: RECTAL EXAM UNDER ANESTHESIA;  Surgeon: Andria Meuse, MD;  [facility]: [facility];  Service: General;  Laterality: N/A;   UPPER GASTROINTESTINAL ENDOSCOPY     WOUND EXPLORATION Right 04/19/2019   Procedure: WOUND EXPLORATION; REPAIR RIGHT FOREARM LACERATION;  Surgeon: Dairl Ponder, MD;  Location: [facility];  Service: Orthopedics;  Laterality: Right;  AXILLARY BLOCK AND MAC    SOCIAL HISTORY: Social History   Socioeconomic History   Marital status: Married    Spouse name: Not on file   Number of children: Not on file   Years of education: Not on file   Highest education level: Not on file  Occupational History   Not on file  Tobacco Use   Smoking status: Never   Smokeless tobacco: Never  Vaping Use   Vaping status: Never Used  Substance and Sexual Activity   Alcohol use: Yes    Alcohol/week: 1.0 standard drink of alcohol    Types: 1 Glasses of wine per week    Comment: glass of wine once a week   Drug use: No   Sexual activity: Not on file  Other Topics Concern   Not on file  Social History Narrative   Not on file   Social Drivers of Health   Financial Resource Strain: Not on file  Food Insecurity: Not on file  Transportation Needs: Not on file  Physical Activity:  Not on file  Stress: Not on file  Social Connections: Not on file  Intimate Partner Violence: Not on file    FAMILY HISTORY: Family History  Problem Relation Age of Onset   Macular degeneration Mother    Dementia Mother    Diverticulitis Mother    Prostate cancer Paternal Grandfather    Diverticulitis Brother    Colon cancer Neg Hx    Colon polyps Neg Hx    Esophageal cancer Neg Hx    Liver cancer Neg Hx    Pancreatic cancer Neg Hx    Rectal cancer Neg Hx    Stomach cancer Neg Hx     ALLERGIES:  is allergic to augmentin [amoxicillin-pot clavulanate].  MEDICATIONS:   Current Outpatient Medications  Medication Sig Dispense Refill   acetaminophen (TYLENOL) 500 MG tablet Take 500 mg by mouth every 6 (six) hours as needed for moderate pain.     Ascorbic Acid (VITAMIN C) 1000 MG tablet Take 1,000 mg by mouth daily.     cholecalciferol (VITAMIN D3) 25 MCG (1000 UNIT) tablet Take 2,000 Units by mouth daily.     famotidine (PEPCID) 20 MG tablet Take 1 tablet (20 mg total) by mouth at bedtime. 90 tablet 4   famotidine (PEPCID) 20 MG tablet Take 1 tablet (20 mg total) by mouth at bedtime. 90 tablet 4   levothyroxine (SYNTHROID) 50 MCG tablet Take 1 tablet (50 mcg total) by mouth every morning on an empty stomach. 90 tablet 3   Magnesium Oxide 420 MG TABS Take 420 mg by mouth daily.     Multiple Vitamins-Minerals (MENS 50+ MULTI VITAMIN/MIN PO) Take 1 tablet by mouth daily.     Omega-3 Fatty Acids (FISH OIL) 1000 MG CAPS Take 1,000 mg by mouth daily.     pantoprazole (PROTONIX) 40 MG tablet Take 1 tablet (40 mg total) by mouth daily. Take 30 minutes before supper 90 tablet 4   pantoprazole (PROTONIX) 40 MG tablet Take 1 tablet (40 mg total) by mouth daily. (Patient taking differently: Take 40 mg by mouth daily as needed (acid reflux).) 90 tablet 4   sildenafil (REVATIO) 20 MG tablet Take 1 tablet by mouth, as directed, 1 hour prior to sexual activity on an empty stomach 30 tablet 2   tadalafil (CIALIS) 20 MG tablet Take 0.5-1 tablets (10-20 mg total) by mouth daily as needed. 10 tablet 3   Current Facility-Administered Medications  Medication Dose Route Frequency Provider Last Rate Last Admin   0.9 %  sodium chloride infusion   Intravenous PRN Janetta Hora, PA-C        REVIEW OF SYSTEMS:    10 Point review of Systems was done is negative except as noted above.   PHYSICAL EXAMINATION: .BP 115/77 (BP Location: Left Arm, Patient Position: Sitting)   Pulse 62   Temp 97.7 F (36.5 C) (Temporal)   Resp 16   Ht 5\' 9"  (1.753 m)   Wt 200 lb 12.8 oz (91.1 kg)    SpO2 100%   BMI 29.65 kg/m   GENERAL:alert, in no acute distress and comfortable SKIN: no acute rashes, no significant lesions EYES: conjunctiva are pink and non-injected, sclera anicteric OROPHARYNX: MMM, no exudates, no oropharyngeal erythema or ulceration NECK: supple, no JVD LYMPH:  no palpable lymphadenopathy in the cervical, axillary or inguinal regions LUNGS: clear to auscultation b/l with normal respiratory effort HEART: regular rate & rhythm ABDOMEN:  normoactive bowel sounds , non tender, not distended. Extremity: no pedal edema PSYCH:  alert & oriented x 3 with fluent speech NEURO: no focal motor/sensory deficits   LABORATORY DATA:   .    Latest Ref Rng & Units 11/29/2023    2:42 PM 07/27/2023    1:13 PM 06/28/2023   11:00 AM  CBC  WBC 4.0 - 10.5 K/uL 6.3  6.5  5.7   Hemoglobin 13.0 - 17.0 g/dL 16.1  09.6  04.5   Hematocrit 39.0 - 52.0 % 44.2  42.0  45.5   Platelets 150 - 400 K/uL 222  215  185    ANC 500 .    Latest Ref Rng & Units 11/29/2023    2:42 PM 07/27/2023    1:13 PM 06/28/2023   11:00 AM  CMP  Glucose 70 - 99 mg/dL 97  88  409   BUN 8 - 23 mg/dL 16  18  16    Creatinine 0.61 - 1.24 mg/dL 8.11  9.14  7.82   Sodium 135 - 145 mmol/L 140  141  140   Potassium 3.5 - 5.1 mmol/L 3.8  4.3  4.2   Chloride 98 - 111 mmol/L 105  106  108   CO2 22 - 32 mmol/L 30  31  20    Calcium 8.9 - 10.3 mg/dL 9.4  9.4  8.9   Total Protein 6.5 - 8.1 g/dL 6.9  6.5    Total Bilirubin 0.0 - 1.2 mg/dL 0.6  0.5    Alkaline Phos 38 - 126 U/L 77  67    AST 15 - 41 U/L 29  26    ALT 0 - 44 U/L 34  34     . Lab Results  Component Value Date   LDH 182 11/29/2023    06/20/2020 Right Axillary Surgical Pathology Report [id number]):    06/20/2020 Right Axillary Lymph Node Flow Pathology ([id number]):   06/11/2020 Flow Pathology ([id number]):   RADIOGRAPHIC STUDIES: I have personally reviewed the radiological images as listed and agreed with the findings in the  report. No results found.  ASSESSMENT & PLAN:   68 yo with   1) stage IV mantle cell lymphoma with extensive lymphadenopathy and massive splenomegaly . 2) s/p massive splenomegaly likely related to mantle cell lymphoma  3) Thrombocytopenia-mild platelets of 210k likely related to lymphoma. - resolved 4) hepatitis B core antibody positive, surface antigen negative, HBV DNA PCR negative 5) Neutropenia - intermitent fluctuating-- likely from  immunologic changes from Rituxan use. No fevers. Now resolved.  PLAN: -Discussed lab results from today, 11/29/2023, in detail with the patient. CBC is stable. CMP stable.  LDH wnl -Recommend influenza vaccine, COVID-19 Booster, RSV vaccine, and other age related vaccines. -no further maintenance Rituxan at this time. - no lab or clinical symptoms/signs suggestive of lymphoma recurrence/progression at this time. -patient continues to be in remission at this time -Answered all of patient's questions.   FOLLOW UP:  RTC with Dr Candise Che with labs in 6 months   The total time spent in the appointment was 20 minutes* .  All of the patient's questions were answered with apparent satisfaction. The patient knows to call the clinic with any problems, questions or concerns.   Wyvonnia Lora MD MS AAHIVMS [facility] Hematology/Oncology Physician [facility]  .*Total Encounter Time as defined by the Centers for Medicare and Medicaid Services includes, in addition to the face-to-face time of a patient visit (documented in the note above) non-face-to-face time: obtaining and reviewing outside history, ordering and reviewing medications, tests or procedures,  care coordination (communications with other health care professionals or caregivers) and documentation in the medical record.   I,Param Shah,acting as a Neurosurgeon for Wyvonnia Lora, MD.,have documented all relevant documentation on the behalf of Wyvonnia Lora, MD,as directed by  Wyvonnia Lora, MD while in  the presence of Wyvonnia Lora, MD.  .I have reviewed the above documentation for accuracy and completeness, and I agree with the above. [name] MD

## 2023-11-30 ENCOUNTER — Telehealth: Payer: Self-pay | Admitting: Hematology

## 2023-11-30 NOTE — Telephone Encounter (Signed)
 Per scheduling orders on 11/29/2023 patient is aware of scheduled appointment times/dates

## 2023-12-05 ENCOUNTER — Encounter: Payer: Self-pay | Admitting: Hematology

## 2024-01-12 ENCOUNTER — Other Ambulatory Visit (HOSPITAL_COMMUNITY): Payer: Self-pay

## 2024-01-12 ENCOUNTER — Encounter: Payer: Self-pay | Admitting: Pharmacist

## 2024-01-12 ENCOUNTER — Other Ambulatory Visit: Payer: Self-pay

## 2024-01-12 ENCOUNTER — Encounter: Payer: Self-pay | Admitting: Hematology

## 2024-04-11 ENCOUNTER — Other Ambulatory Visit (HOSPITAL_COMMUNITY): Payer: Self-pay

## 2024-04-11 ENCOUNTER — Encounter: Payer: Self-pay | Admitting: Hematology

## 2024-04-11 MED ORDER — TADALAFIL 20 MG PO TABS
10.0000 mg | ORAL_TABLET | Freq: Every day | ORAL | 1 refills | Status: DC | PRN
  Filled 2024-04-11: qty 10, 15d supply, fill #0
  Filled 2024-06-14: qty 10, 15d supply, fill #1

## 2024-04-11 MED ORDER — LEVOTHYROXINE SODIUM 50 MCG PO TABS
50.0000 ug | ORAL_TABLET | Freq: Every morning | ORAL | 0 refills | Status: DC
  Filled 2024-04-11: qty 90, 90d supply, fill #0

## 2024-04-12 ENCOUNTER — Other Ambulatory Visit: Payer: Self-pay

## 2024-05-26 ENCOUNTER — Other Ambulatory Visit: Payer: Self-pay

## 2024-05-26 DIAGNOSIS — C8318 Mantle cell lymphoma, lymph nodes of multiple sites: Secondary | ICD-10-CM

## 2024-05-29 ENCOUNTER — Inpatient Hospital Stay: Attending: Hematology

## 2024-05-29 ENCOUNTER — Inpatient Hospital Stay (HOSPITAL_BASED_OUTPATIENT_CLINIC_OR_DEPARTMENT_OTHER): Admitting: Hematology

## 2024-05-29 VITALS — BP 133/94 | HR 62 | Temp 97.5°F | Resp 20 | Wt 193.5 lb

## 2024-05-29 DIAGNOSIS — C8318 Mantle cell lymphoma, lymph nodes of multiple sites: Secondary | ICD-10-CM

## 2024-05-29 DIAGNOSIS — Z8572 Personal history of non-Hodgkin lymphomas: Secondary | ICD-10-CM | POA: Diagnosis present

## 2024-05-29 LAB — CMP (CANCER CENTER ONLY)
ALT: 23 U/L (ref 0–44)
AST: 22 U/L (ref 15–41)
Albumin: 4.5 g/dL (ref 3.5–5.0)
Alkaline Phosphatase: 62 U/L (ref 38–126)
Anion gap: 5 (ref 5–15)
BUN: 18 mg/dL (ref 8–23)
CO2: 28 mmol/L (ref 22–32)
Calcium: 9.2 mg/dL (ref 8.9–10.3)
Chloride: 108 mmol/L (ref 98–111)
Creatinine: 1.06 mg/dL (ref 0.61–1.24)
GFR, Estimated: >60 mL/min (ref >60)
Glucose, Bld: 84 mg/dL (ref 70–99)
Potassium: 4 mmol/L (ref 3.5–5.1)
Sodium: 141 mmol/L (ref 135–145)
Total Bilirubin: 0.4 mg/dL (ref 0.0–1.2)
Total Protein: 6.5 g/dL (ref 6.5–8.1)

## 2024-05-29 LAB — CBC WITH DIFFERENTIAL (CANCER CENTER ONLY)
Abs Immature Granulocytes: 0.01 K/uL (ref 0.00–0.07)
Basophils Absolute: 0 K/uL (ref 0.0–0.1)
Basophils Relative: 1 %
Eosinophils Absolute: 0 K/uL (ref 0.0–0.5)
Eosinophils Relative: 1 %
HCT: 41.6 % (ref 39.0–52.0)
Hemoglobin: 14.9 g/dL (ref 13.0–17.0)
Immature Granulocytes: 0 %
Lymphocytes Relative: 29 %
Lymphs Abs: 1.8 K/uL (ref 0.7–4.0)
MCH: 31.9 pg (ref 26.0–34.0)
MCHC: 35.8 g/dL (ref 30.0–36.0)
MCV: 89.1 fL (ref 80.0–100.0)
Monocytes Absolute: 0.5 K/uL (ref 0.1–1.0)
Monocytes Relative: 7 %
Neutro Abs: 3.8 K/uL (ref 1.7–7.7)
Neutrophils Relative %: 62 %
Platelet Count: 202 K/uL (ref 150–400)
RBC: 4.67 MIL/uL (ref 4.22–5.81)
RDW: 12.4 % (ref 11.5–15.5)
WBC Count: 6.2 K/uL (ref 4.0–10.5)
nRBC: 0 % (ref 0.0–0.2)

## 2024-05-29 LAB — LACTATE DEHYDROGENASE: LDH: 147 U/L (ref 98–192)

## 2024-06-02 ENCOUNTER — Other Ambulatory Visit

## 2024-06-02 ENCOUNTER — Ambulatory Visit: Admitting: Hematology

## 2024-06-06 NOTE — Progress Notes (Signed)
 HEMATOLOGY/ONCOLOGY CLINIC VISIT NOTE  Date of Service: .05/29/2024  Patient Care Team: Hugh Charleston, MD (Inactive) as PCP - General (Family Medicine) [name], MD as Consulting Physician (Hematology)  CHIEF COMPLAINTS/PURPOSE OF CONSULTATION:  Follow-up for continued evaluation and management of mantle cell lymphoma  HISTORY OF PRESENTING ILLNESS:  Please see previous note for details on initial presentation.  INTERVAL HISTORY:  Brian Kaiser is a 68 y.o. male is here for continued evaluation and management of his mantle cell lymphoma. He notes no acute new concerns since his last clinic visit. Recently had a wonderful vacation in [place] with his wife and appears to be in good spirits. Was seen and followed by Dr. Blinda at [facility] on 03/15/2024. He notes no new lumps or bumps.  No fevers no chills no night sweats no unexpected weight loss. No infection issues. No new issues with neutropenia. Follows with dermatology for management of anal condylomata. He is following with his PCP for management of his chronic medical issues including hypothyroidism and diabetes. No new GI symptoms. No new chest pain or shortness of breath.  No abdominal pain or distention.   MEDICAL HISTORY:  Past Medical History:  Diagnosis Date   Allergy    Forearm laceration    right forearm   GERD (gastroesophageal reflux disease)    Hepatitis B    Hyperlipidemia    Hypothyroidism    Pulmonary lesion    Left pulmonary schwannoma   Schwannoma    left lower lung    SURGICAL HISTORY: Past Surgical History:  Procedure Laterality Date   COLONOSCOPY     LIGAMENT REPAIR Right 04/19/2019   Right forearm   LIPOMA EXCISION Left    neck   LUNG SURGERY Left    MASS EXCISION N/A 06/28/2023   Procedure: EXCISION PERIANAL LESION;  Surgeon: Teresa Lonni HERO, MD;  Location: [facility] OR;  Service: General;  Laterality: N/A;   OTHER SURGICAL HISTORY Left    pulmonary schwannoma excision    RECTAL EXAM UNDER ANESTHESIA N/A 06/28/2023   Procedure: RECTAL EXAM UNDER ANESTHESIA;  Surgeon: Teresa Lonni HERO, MD;  Location: [facility] OR;  Service: General;  Laterality: N/A;   UPPER GASTROINTESTINAL ENDOSCOPY     WOUND EXPLORATION Right 04/19/2019   Procedure: WOUND EXPLORATION; REPAIR RIGHT FOREARM LACERATION;  Surgeon: Sissy Cough, MD;  Location: [facility];  Service: Orthopedics;  Laterality: Right;  AXILLARY BLOCK AND MAC    SOCIAL HISTORY: Social History   Socioeconomic History   Marital status: Married    Spouse name: Not on file   Number of children: Not on file   Years of education: Not on file   Highest education level: Not on file  Occupational History   Not on file  Tobacco Use   Smoking status: Never   Smokeless tobacco: Never  Vaping Use   Vaping status: Never Used  Substance and Sexual Activity   Alcohol use: Yes    Alcohol/week: 1.0 standard drink of alcohol    Types: 1 Glasses of wine per week    Comment: glass of wine once a week   Drug use: No   Sexual activity: Not on file  Other Topics Concern   Not on file  Social History Narrative   Not on file   Social Drivers of Health   Financial Resource Strain: Not on file  Food Insecurity: Not on file  Transportation Needs: Not on file  Physical Activity: Not on file  Stress: Not on  file  Social Connections: Not on file  Intimate Partner Violence: Not on file    FAMILY HISTORY: Family History  Problem Relation Age of Onset   Macular degeneration Mother    Dementia Mother    Diverticulitis Mother    Prostate cancer Paternal Grandfather    Diverticulitis Brother    Colon cancer Neg Hx    Colon polyps Neg Hx    Esophageal cancer Neg Hx    Liver cancer Neg Hx    Pancreatic cancer Neg Hx    Rectal cancer Neg Hx    Stomach cancer Neg Hx     ALLERGIES:  is allergic to augmentin [amoxicillin-pot clavulanate].  MEDICATIONS:  Current Outpatient Medications  Medication Sig  Dispense Refill   acetaminophen  (TYLENOL ) 500 MG tablet Take 500 mg by mouth every 6 (six) hours as needed for moderate pain.     Ascorbic Acid  (VITAMIN C) 1000 MG tablet Take 1,000 mg by mouth daily.     cholecalciferol (VITAMIN D3) 25 MCG (1000 UNIT) tablet Take 2,000 Units by mouth daily.     famotidine  (PEPCID ) 20 MG tablet Take 1 tablet (20 mg total) by mouth at bedtime. 90 tablet 4   famotidine  (PEPCID ) 20 MG tablet Take 1 tablet (20 mg total) by mouth at bedtime. 90 tablet 4   levothyroxine  (SYNTHROID ) 50 MCG tablet Take 1 tablet (50 mcg total) by mouth every morning on an empty stomach. 90 tablet 0   Magnesium  Oxide 420 MG TABS Take 420 mg by mouth daily.     Multiple Vitamins-Minerals (MENS 50+ MULTI VITAMIN/MIN PO) Take 1 tablet by mouth daily.     Omega-3 Fatty Acids (FISH OIL) 1000 MG CAPS Take 1,000 mg by mouth daily.     pantoprazole  (PROTONIX ) 40 MG tablet Take 1 tablet (40 mg total) by mouth daily. Take 30 minutes before supper 90 tablet 4   pantoprazole  (PROTONIX ) 40 MG tablet Take 1 tablet (40 mg total) by mouth daily. (Patient taking differently: Take 40 mg by mouth daily as needed (acid reflux).) 90 tablet 4   sildenafil  (REVATIO ) 20 MG tablet Take 1 tablet by mouth, as directed, 1 hour prior to sexual activity on an empty stomach 30 tablet 2   tadalafil  (CIALIS ) 20 MG tablet Take 0.5-1 tablets (10-20 mg total) by mouth daily as needed. 10 tablet 1   Current Facility-Administered Medications  Medication Dose Route Frequency Provider Last Rate Last Admin   0.9 %  sodium chloride  infusion   Intravenous PRN Sebastian Lamarr SAUNDERS, PA-C        REVIEW OF SYSTEMS:    .10 Point review of Systems was done is negative except as noted above.   PHYSICAL EXAMINATION: .BP (!) 133/94   Pulse 62   Temp (!) 97.5 F (36.4 C)   Resp 20   Wt 193 lb 8 oz (87.8 kg)   SpO2 98%   BMI 28.57 kg/m  . GENERAL:alert, in no acute distress and comfortable SKIN: no acute rashes, no significant  lesions EYES: conjunctiva are pink and non-injected, sclera anicteric OROPHARYNX: MMM, no exudates, no oropharyngeal erythema or ulceration NECK: supple, no JVD LYMPH:  no palpable lymphadenopathy in the cervical, axillary or inguinal regions LUNGS: clear to auscultation b/l with normal respiratory effort HEART: regular rate & rhythm ABDOMEN:  normoactive bowel sounds , non tender, not distended. Extremity: no pedal edema PSYCH: alert & oriented x 3 with fluent speech NEURO: no focal motor/sensory deficits   LABORATORY DATA:   .  Latest Ref Rng & Units 05/29/2024    2:29 PM 11/29/2023    2:42 PM 07/27/2023    1:13 PM  CBC  WBC 4.0 - 10.5 K/uL 6.2  6.3  6.5   Hemoglobin 13.0 - 17.0 g/dL 85.0  84.3  85.2   Hematocrit 39.0 - 52.0 % 41.6  44.2  42.0   Platelets 150 - 400 K/uL 202  222  215    ANC 500 .    Latest Ref Rng & Units 05/29/2024    2:29 PM 11/29/2023    2:42 PM 07/27/2023    1:13 PM  CMP  Glucose 70 - 99 mg/dL 84  97  88   BUN 8 - 23 mg/dL 18  16  18    Creatinine 0.61 - 1.24 mg/dL 8.93  8.91  8.84   Sodium 135 - 145 mmol/L 141  140  141   Potassium 3.5 - 5.1 mmol/L 4.0  3.8  4.3   Chloride 98 - 111 mmol/L 108  105  106   CO2 22 - 32 mmol/L 28  30  31    Calcium 8.9 - 10.3 mg/dL 9.2  9.4  9.4   Total Protein 6.5 - 8.1 g/dL 6.5  6.9  6.5   Total Bilirubin 0.0 - 1.2 mg/dL 0.4  0.6  0.5   Alkaline Phos 38 - 126 U/L 62  77  67   AST 15 - 41 U/L 22  29  26    ALT 0 - 44 U/L 23  34  34    . Lab Results  Component Value Date   LDH 147 05/29/2024    06/20/2020 Right Axillary Surgical Pathology Report [id number]):    06/20/2020 Right Axillary Lymph Node Flow Pathology ([id number]):   06/11/2020 Flow Pathology ([id number]):   RADIOGRAPHIC STUDIES: I have personally reviewed the radiological images as listed and agreed with the findings in the report. No results found.  ASSESSMENT & PLAN:   68 yo with   1) stage IV mantle cell lymphoma with  extensive lymphadenopathy and massive splenomegaly . 2) s/p massive splenomegaly likely related to mantle cell lymphoma  3) Thrombocytopenia-mild platelets of 210k likely related to lymphoma. - resolved 4) hepatitis B core antibody positive, surface antigen negative, HBV DNA PCR negative 5) Neutropenia - intermitent fluctuating-- likely from  immunologic changes from Rituxan  use. No fevers. Now resolved.  PLAN: - Discussed lab results from today 05/29/2024 - CBC is within normal limits with a hemoglobin of 14.9, WBC count of 6.2k platelets of 202k CMP is within normal limits LDH is within normal limits at 147. Patient does not appear to have recurrent neutropenia at this time.  This is resolved since he has been off the Rituxan .  Was likely due to immunological imbalance produced by Rituxan  that has now resolved. Patient has no lab or clinical evidence of mantle cell lymphoma recurrence at this time. No issues with recurrent infections. She continues to alternate visits with Dr. Blinda and's every 4 months for continued surveillance of his mantle cell lymphoma. Continue follow-up with PCP for age-appropriate cancer screening and vaccinations.  FOLLOW UP:  RTC with Dr Onesimo with labs in 8 months   The total time spent in the appointment was 30 minutes*.  All of the patient's questions were answered with apparent satisfaction. The patient knows to call the clinic with any problems, questions or concerns.   Emaline Onesimo MD MS AAHIVMS [facility] Hematology/Oncology Physician [facility]  .*Total Encounter Time  as defined by the Centers for Medicare and Medicaid Services includes, in addition to the face-to-face time of a patient visit (documented in the note above) non-face-to-face time: obtaining and reviewing outside history, ordering and reviewing medications, tests or procedures, care coordination (communications with other health care professionals or caregivers) and  documentation in the medical record.

## 2024-06-14 ENCOUNTER — Other Ambulatory Visit (HOSPITAL_COMMUNITY): Payer: Self-pay

## 2024-06-14 MED ORDER — LEVOTHYROXINE SODIUM 50 MCG PO TABS
50.0000 ug | ORAL_TABLET | Freq: Every morning | ORAL | 3 refills | Status: AC
  Filled 2024-06-14 – 2024-07-12 (×2): qty 90, 90d supply, fill #0
  Filled 2024-10-18: qty 90, 90d supply, fill #1

## 2024-07-12 ENCOUNTER — Other Ambulatory Visit: Payer: Self-pay

## 2024-07-12 ENCOUNTER — Other Ambulatory Visit: Payer: Self-pay | Admitting: Gastroenterology

## 2024-07-12 ENCOUNTER — Other Ambulatory Visit (HOSPITAL_COMMUNITY): Payer: Self-pay

## 2024-07-12 MED ORDER — PANTOPRAZOLE SODIUM 40 MG PO TBEC
40.0000 mg | DELAYED_RELEASE_TABLET | Freq: Every day | ORAL | 0 refills | Status: AC
  Filled 2024-07-12: qty 90, 90d supply, fill #0

## 2024-10-18 ENCOUNTER — Other Ambulatory Visit: Payer: Self-pay

## 2024-10-18 ENCOUNTER — Other Ambulatory Visit: Payer: Self-pay | Admitting: Gastroenterology

## 2024-10-18 ENCOUNTER — Other Ambulatory Visit (HOSPITAL_COMMUNITY): Payer: Self-pay

## 2024-10-19 ENCOUNTER — Other Ambulatory Visit (HOSPITAL_COMMUNITY): Payer: Self-pay

## 2024-10-19 ENCOUNTER — Other Ambulatory Visit (HOSPITAL_BASED_OUTPATIENT_CLINIC_OR_DEPARTMENT_OTHER): Payer: Self-pay

## 2024-10-19 MED ORDER — TADALAFIL 20 MG PO TABS
10.0000 mg | ORAL_TABLET | Freq: Every day | ORAL | 1 refills | Status: AC | PRN
  Filled 2024-10-19: qty 10, 10d supply, fill #0

## 2024-10-21 ENCOUNTER — Other Ambulatory Visit (HOSPITAL_COMMUNITY): Payer: Self-pay

## 2024-10-24 ENCOUNTER — Other Ambulatory Visit (HOSPITAL_COMMUNITY): Payer: Self-pay

## 2025-01-22 ENCOUNTER — Inpatient Hospital Stay

## 2025-01-22 ENCOUNTER — Inpatient Hospital Stay: Admitting: Hematology
